# Patient Record
Sex: Female | Born: 1960
Health system: Southern US, Community
[De-identification: ages and names within clinical notes are randomized; demographics above are authoritative.]

## PROBLEM LIST (undated history)

## (undated) DIAGNOSIS — Z8719 Personal history of other diseases of the digestive system: Secondary | ICD-10-CM

## (undated) DIAGNOSIS — E785 Hyperlipidemia, unspecified: Secondary | ICD-10-CM

## (undated) DIAGNOSIS — K219 Gastro-esophageal reflux disease without esophagitis: Secondary | ICD-10-CM

## (undated) DIAGNOSIS — F329 Major depressive disorder, single episode, unspecified: Secondary | ICD-10-CM

## (undated) DIAGNOSIS — F40240 Claustrophobia: Secondary | ICD-10-CM

## (undated) DIAGNOSIS — E119 Type 2 diabetes mellitus without complications: Secondary | ICD-10-CM

## (undated) DIAGNOSIS — I10 Essential (primary) hypertension: Secondary | ICD-10-CM

## (undated) DIAGNOSIS — R51 Headache: Secondary | ICD-10-CM

## (undated) DIAGNOSIS — E039 Hypothyroidism, unspecified: Secondary | ICD-10-CM

## (undated) HISTORY — DX: Hyperlipidemia, unspecified: E78.5

## (undated) HISTORY — DX: Essential (primary) hypertension: I10

## (undated) HISTORY — PX: CHOLECYSTECTOMY: SHX55

## (undated) HISTORY — DX: Type 2 diabetes mellitus without complications: E11.9

---

## 2001-08-07 DIAGNOSIS — F32A Depression, unspecified: Secondary | ICD-10-CM

## 2001-08-07 HISTORY — DX: Depression, unspecified: F32.A

## 2002-04-15 ENCOUNTER — Encounter: Admission: RE | Admit: 2002-04-15 | Discharge: 2002-05-14 | Payer: Self-pay | Admitting: Family Medicine

## 2003-07-13 ENCOUNTER — Emergency Department (HOSPITAL_COMMUNITY): Admission: EM | Admit: 2003-07-13 | Discharge: 2003-07-13 | Payer: Self-pay

## 2003-07-28 ENCOUNTER — Ambulatory Visit (HOSPITAL_COMMUNITY): Admission: RE | Admit: 2003-07-28 | Discharge: 2003-07-28 | Payer: Self-pay | Admitting: Sports Medicine

## 2005-05-17 ENCOUNTER — Inpatient Hospital Stay (HOSPITAL_COMMUNITY): Admission: RE | Admit: 2005-05-17 | Discharge: 2005-05-23 | Payer: Self-pay | Admitting: Psychiatry

## 2005-05-17 ENCOUNTER — Ambulatory Visit: Payer: Self-pay | Admitting: Psychiatry

## 2005-10-20 ENCOUNTER — Inpatient Hospital Stay (HOSPITAL_COMMUNITY): Admission: EM | Admit: 2005-10-20 | Discharge: 2005-10-23 | Payer: Self-pay | Admitting: Emergency Medicine

## 2005-10-20 ENCOUNTER — Ambulatory Visit: Payer: Self-pay | Admitting: Infectious Diseases

## 2008-10-07 LAB — CONVERTED CEMR LAB: Microalbumin U total vol: NORMAL mg/L

## 2008-10-08 ENCOUNTER — Ambulatory Visit: Payer: Self-pay | Admitting: Family Medicine

## 2008-10-08 ENCOUNTER — Ambulatory Visit: Payer: Self-pay | Admitting: Surgery

## 2008-10-08 ENCOUNTER — Inpatient Hospital Stay (HOSPITAL_COMMUNITY): Admission: EM | Admit: 2008-10-08 | Discharge: 2008-10-14 | Payer: Self-pay | Admitting: Emergency Medicine

## 2008-10-09 LAB — CONVERTED CEMR LAB
HDL: 40 mg/dL
LDL Cholesterol: 177 mg/dL

## 2008-10-14 LAB — CONVERTED CEMR LAB
Creatinine, Ser: 1.08 mg/dL
Potassium: 3.4 meq/L

## 2008-10-16 ENCOUNTER — Telehealth: Payer: Self-pay | Admitting: Sports Medicine

## 2008-10-17 ENCOUNTER — Telehealth: Payer: Self-pay | Admitting: Family Medicine

## 2008-10-20 ENCOUNTER — Ambulatory Visit: Payer: Self-pay | Admitting: Family Medicine

## 2008-10-20 DIAGNOSIS — E119 Type 2 diabetes mellitus without complications: Secondary | ICD-10-CM | POA: Insufficient documentation

## 2008-10-26 ENCOUNTER — Encounter (HOSPITAL_BASED_OUTPATIENT_CLINIC_OR_DEPARTMENT_OTHER): Admission: RE | Admit: 2008-10-26 | Discharge: 2008-12-08 | Payer: Self-pay | Admitting: General Surgery

## 2008-10-27 ENCOUNTER — Encounter: Payer: Self-pay | Admitting: *Deleted

## 2008-10-27 ENCOUNTER — Ambulatory Visit: Payer: Self-pay | Admitting: Family Medicine

## 2008-10-27 DIAGNOSIS — I10 Essential (primary) hypertension: Secondary | ICD-10-CM | POA: Insufficient documentation

## 2008-10-27 DIAGNOSIS — E669 Obesity, unspecified: Secondary | ICD-10-CM | POA: Insufficient documentation

## 2008-10-27 DIAGNOSIS — E039 Hypothyroidism, unspecified: Secondary | ICD-10-CM | POA: Insufficient documentation

## 2008-10-28 ENCOUNTER — Encounter: Payer: Self-pay | Admitting: Sports Medicine

## 2008-11-03 ENCOUNTER — Ambulatory Visit: Payer: Self-pay | Admitting: Family Medicine

## 2008-11-03 ENCOUNTER — Ambulatory Visit (HOSPITAL_COMMUNITY): Admission: RE | Admit: 2008-11-03 | Discharge: 2008-11-03 | Payer: Self-pay | Admitting: Sports Medicine

## 2008-11-11 ENCOUNTER — Encounter: Payer: Self-pay | Admitting: Sports Medicine

## 2009-09-28 IMAGING — MG MS DIGITAL SCREENING BILAT
7 series · 7 of 7 positions shown · non-contrast
Comparison: none

DG SCHOLARSHIP MAMMOGRAM
Bilateral CC and MLO view(s) were taken.

DIGITAL SCREENING MAMMOGRAM WITH CAD:
The breast tissue is almost entirely fatty.  No masses or malignant type calcifications are 
identified.

[R CC (1 of 2)]
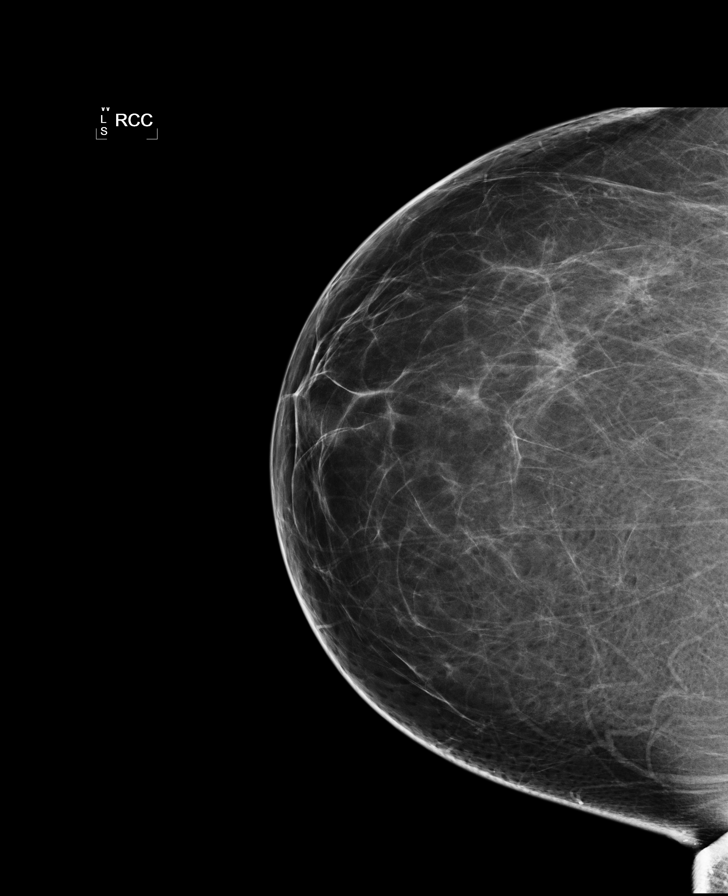

[R MLO (1 of 2)]
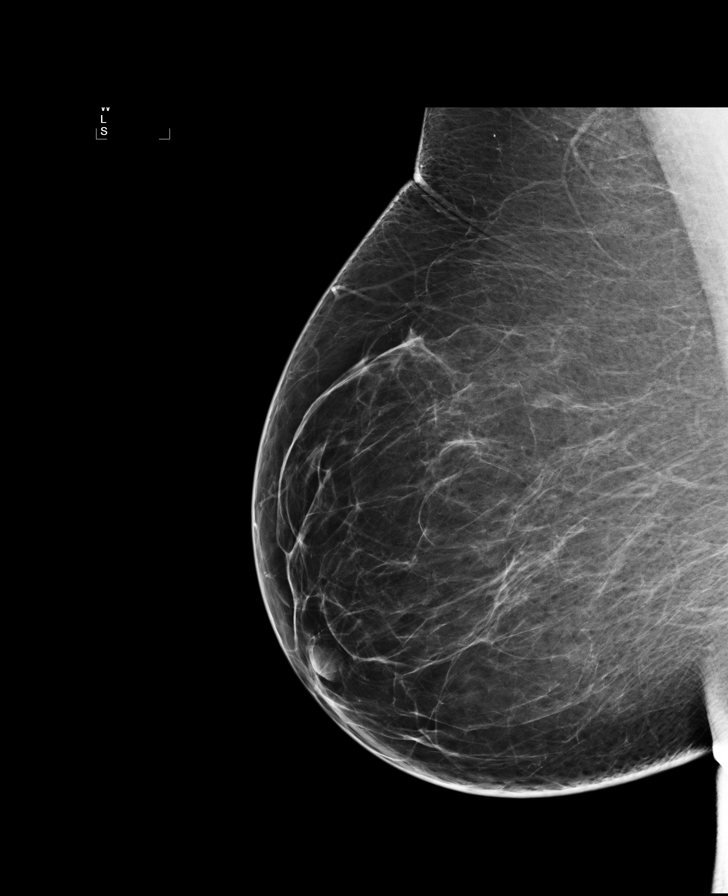

[L CC (1 of 2)]
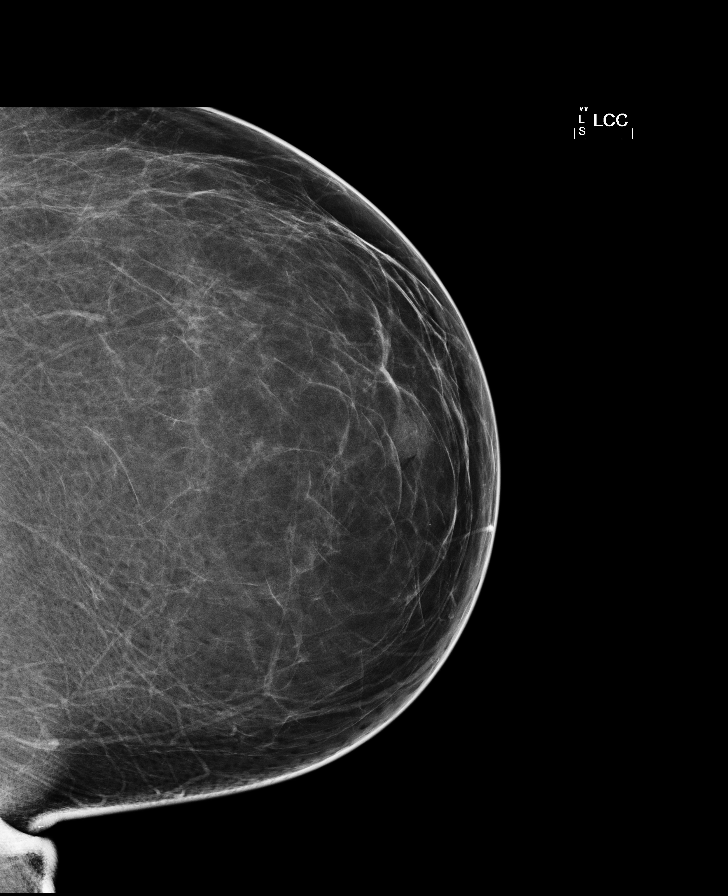

[L MLO]
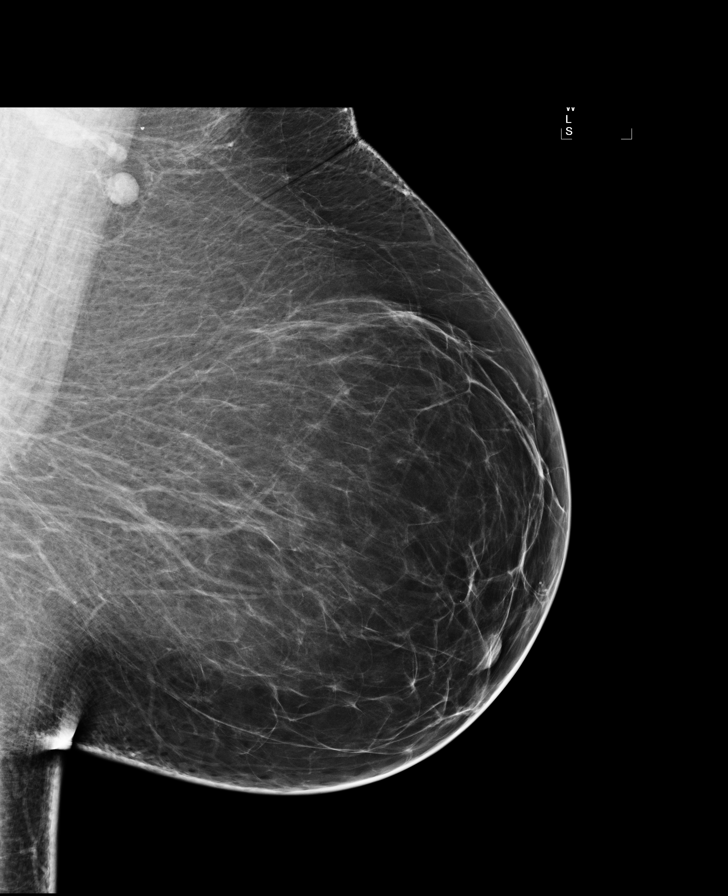

[R CC (2 of 2)]
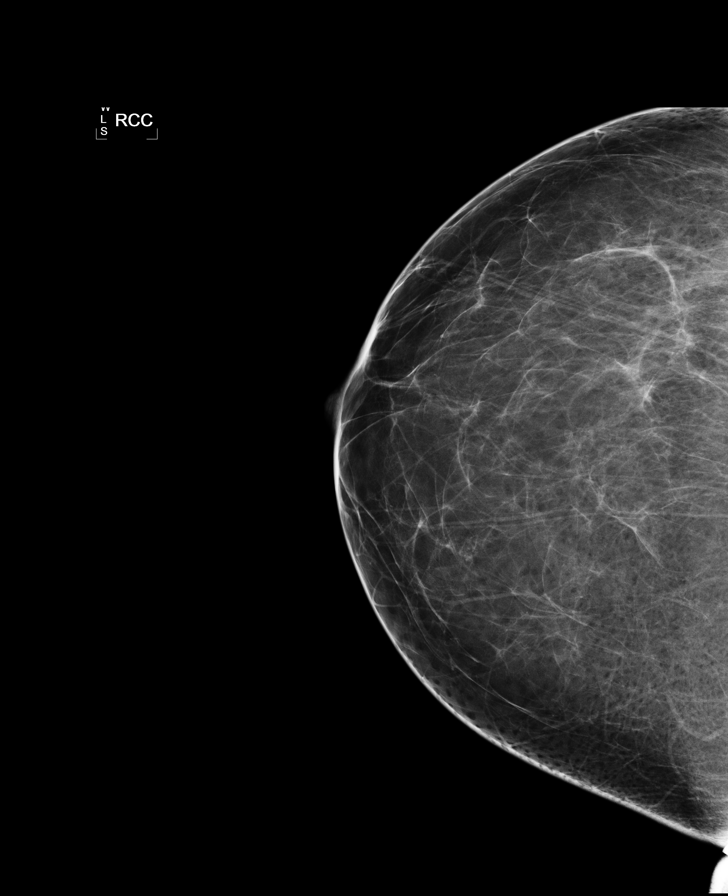

[L CC (2 of 2)]
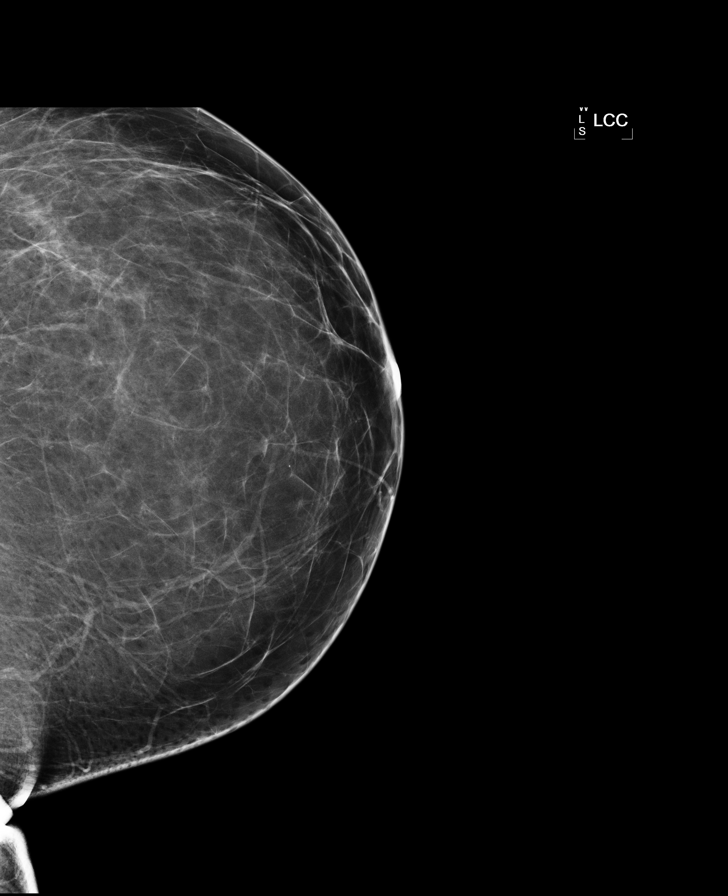

[R MLO (2 of 2)]
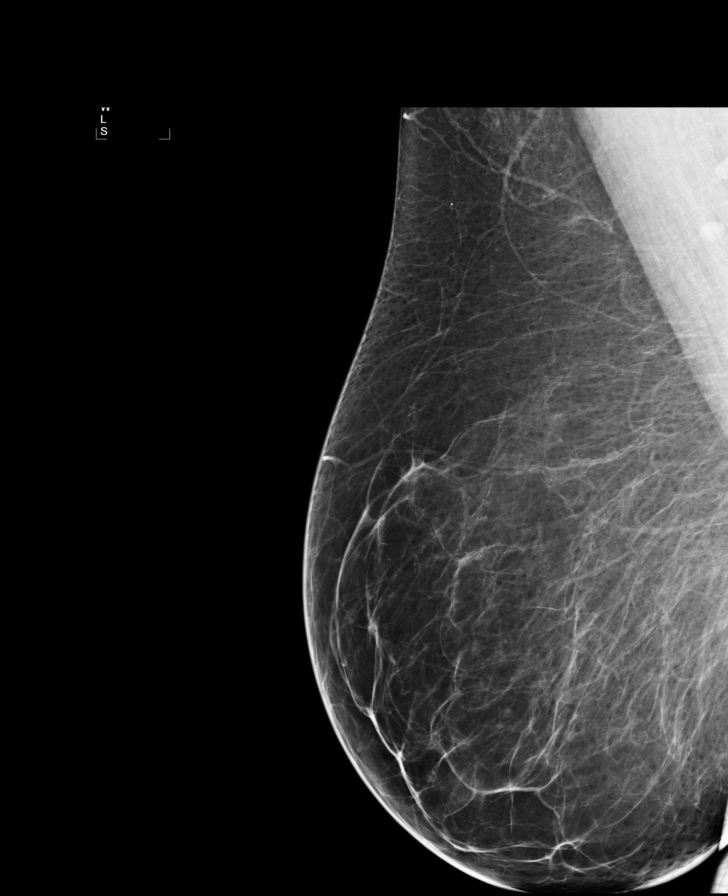

[7 of 7 positions shown; findings below may reference images not displayed]

IMPRESSION: No specific mammographic evidence of malignancy.  Next screening mammogram is recommended in one 
year.

A result letter of this screening mammogram will be mailed directly to the patient.

ASSESSMENT: Negative - BI-RADS 1

Screening mammogram in 1 year.
ANALYZED BY COMPUTER AIDED DETECTION. , THIS PROCEDURE WAS A DIGITAL MAMMOGRAM.

## 2009-10-21 ENCOUNTER — Encounter: Payer: Self-pay | Admitting: Sports Medicine

## 2009-11-04 ENCOUNTER — Ambulatory Visit (HOSPITAL_COMMUNITY): Admission: RE | Admit: 2009-11-04 | Discharge: 2009-11-04 | Payer: Self-pay | Admitting: Sports Medicine

## 2009-11-08 ENCOUNTER — Telehealth: Payer: Self-pay | Admitting: Sports Medicine

## 2010-02-10 ENCOUNTER — Ambulatory Visit: Payer: Self-pay | Admitting: Family Medicine

## 2010-02-10 ENCOUNTER — Encounter: Payer: Self-pay | Admitting: Sports Medicine

## 2010-02-10 DIAGNOSIS — Z720 Tobacco use: Secondary | ICD-10-CM | POA: Insufficient documentation

## 2010-02-11 ENCOUNTER — Telehealth (INDEPENDENT_AMBULATORY_CARE_PROVIDER_SITE_OTHER): Payer: Self-pay | Admitting: *Deleted

## 2010-02-11 LAB — CONVERTED CEMR LAB
ALT: 14 units/L (ref 0–35)
AST: 12 units/L (ref 0–37)
Albumin: 3.9 g/dL (ref 3.5–5.2)
Alkaline Phosphatase: 70 units/L (ref 39–117)
BUN: 17 mg/dL (ref 6–23)
CO2: 21 meq/L (ref 19–32)
Calcium: 9.1 mg/dL (ref 8.4–10.5)
Chloride: 108 meq/L (ref 96–112)
Cholesterol: 187 mg/dL (ref 0–200)
Creatinine, Ser: 0.73 mg/dL (ref 0.40–1.20)
Glucose, Bld: 94 mg/dL (ref 70–99)
HDL: 43 mg/dL (ref >39)
LDL Cholesterol: 123 mg/dL — ABNORMAL HIGH (ref 0–99)
Potassium: 4.6 meq/L (ref 3.5–5.3)
Sodium: 140 meq/L (ref 135–145)
TSH: 0.117 microintl units/mL — ABNORMAL LOW (ref 0.350–4.500)
Total Bilirubin: 0.3 mg/dL (ref 0.3–1.2)
Total CHOL/HDL Ratio: 4.3
Total Protein: 6.5 g/dL (ref 6.0–8.3)
Triglycerides: 106 mg/dL (ref <150)
VLDL: 21 mg/dL (ref 0–40)

## 2010-03-02 ENCOUNTER — Telehealth: Payer: Self-pay | Admitting: Sports Medicine

## 2010-03-02 ENCOUNTER — Encounter: Payer: Self-pay | Admitting: Psychology

## 2010-03-02 ENCOUNTER — Ambulatory Visit: Payer: Self-pay | Admitting: Family Medicine

## 2010-03-02 DIAGNOSIS — R1900 Intra-abdominal and pelvic swelling, mass and lump, unspecified site: Secondary | ICD-10-CM | POA: Insufficient documentation

## 2010-03-02 LAB — CONVERTED CEMR LAB
Pap Smear: NEGATIVE
Whiff Test: NEGATIVE

## 2010-03-09 ENCOUNTER — Ambulatory Visit (HOSPITAL_COMMUNITY): Admission: RE | Admit: 2010-03-09 | Discharge: 2010-03-09 | Payer: Self-pay | Admitting: Sports Medicine

## 2010-03-21 ENCOUNTER — Telehealth: Payer: Self-pay | Admitting: *Deleted

## 2010-06-12 ENCOUNTER — Inpatient Hospital Stay (HOSPITAL_COMMUNITY): Admission: EM | Admit: 2010-06-12 | Discharge: 2010-06-15 | Payer: Self-pay | Admitting: Emergency Medicine

## 2010-06-12 ENCOUNTER — Encounter: Payer: Self-pay | Admitting: Family Medicine

## 2010-06-12 DIAGNOSIS — E785 Hyperlipidemia, unspecified: Secondary | ICD-10-CM | POA: Insufficient documentation

## 2010-06-13 ENCOUNTER — Encounter: Payer: Self-pay | Admitting: Sports Medicine

## 2010-06-17 ENCOUNTER — Emergency Department (HOSPITAL_COMMUNITY)
Admission: EM | Admit: 2010-06-17 | Discharge: 2010-06-17 | Payer: Self-pay | Source: Home / Self Care | Admitting: Emergency Medicine

## 2010-06-17 ENCOUNTER — Telehealth (INDEPENDENT_AMBULATORY_CARE_PROVIDER_SITE_OTHER): Payer: Self-pay | Admitting: *Deleted

## 2010-06-17 ENCOUNTER — Emergency Department (HOSPITAL_COMMUNITY)
Admission: EM | Admit: 2010-06-17 | Discharge: 2010-06-17 | Disposition: A | Discharge status: Other Acute Inpatient Hospital | Payer: Self-pay | Source: Home / Self Care | Admitting: Family Medicine

## 2010-06-22 ENCOUNTER — Encounter: Payer: Self-pay | Admitting: Sports Medicine

## 2010-06-22 ENCOUNTER — Ambulatory Visit: Payer: Self-pay | Admitting: Family Medicine

## 2010-06-29 ENCOUNTER — Ambulatory Visit: Payer: Self-pay | Admitting: Family Medicine

## 2010-06-29 DIAGNOSIS — I872 Venous insufficiency (chronic) (peripheral): Secondary | ICD-10-CM | POA: Insufficient documentation

## 2010-07-07 ENCOUNTER — Encounter: Payer: Self-pay | Admitting: Sports Medicine

## 2010-07-07 ENCOUNTER — Ambulatory Visit: Payer: Self-pay | Admitting: Family Medicine

## 2010-07-07 LAB — CONVERTED CEMR LAB
Hgb A1c MFr Bld: 5.9 %
TSH: 0.548 microintl units/mL (ref 0.350–4.500)

## 2010-07-08 ENCOUNTER — Encounter: Payer: Self-pay | Admitting: Sports Medicine

## 2010-07-15 ENCOUNTER — Encounter: Payer: Self-pay | Admitting: Sports Medicine

## 2010-07-15 ENCOUNTER — Ambulatory Visit: Payer: Self-pay | Admitting: Family Medicine

## 2010-07-20 ENCOUNTER — Ambulatory Visit: Payer: Self-pay | Admitting: Family Medicine

## 2010-09-06 NOTE — Letter (Signed)
Summary: Results Follow-up Letter  [facility]  [address]   [place], [place] [address]   Phone: [phone]  Fax: [phone]    07/08/2010  [address] [place], [place]  [address]  [place]  Dear Ms. Drzewiecki,   The following are the results of your recent test(s):   Test     Result     Normal Range  TSH            0.548 uIU/mL         0.350-4.500    No need to change your levothyroxine dosage.  See you in a week!   Sincerely,  Rodney Langton MD [facility]           Appended Document: Results Follow-up Letter mailed

## 2010-09-06 NOTE — Progress Notes (Signed)
Summary: phn msg  Phone Note Call from Patient Call back at [facility] Phone [phone]   Caller: Patient Summary of Call: pt returned call Initial call taken by: De Nurse,  February 11, 2010 11:38 AM  Follow-up for Phone Call        pt notified of change of thyroid medication, voiced understanding Follow-up by: Gladstone Pih,  February 11, 2010 11:57 AM

## 2010-09-06 NOTE — Assessment & Plan Note (Signed)
Summary: f/u from urgent care/[facility]   Vital Signs:  Patient profile:   50 year old female Weight:      265 pounds Temp:     98.4 degrees F oral Pulse rate:   73 / minute Pulse rhythm:   regular BP sitting:   167 / 99  (left arm) Cuff size:   large  Vitals Entered By: Loralee Pacas CMA (June 22, 2010 9:44 AM) CC: follow-up visit from UC Comments thinks that she had a reaction to the cipro   Primary Care Provider:  Rodney Langton MD  CC:  follow-up visit from UC.  History of Present Illness: 50 yo female with DM2 and multiple episodes of cellulitis req hospitalization here for fu.  Her R leg has cellulitis over the entire ant aspect of her lower leg.  She was on Vanc/Zosyn/diflucan in the hospital, it is overall getting worse per pt.  She was placed on Clinda and Suprax at the [facility] as she had a reaction to cipro.  She has 2d left of Suprax and several days left of her clinda.  No fevers/chills, just pain.  Her L leg is healed but is discolored.  I spent over 25 mins with this patient.  Habits & Providers  Alcohol-Tobacco-Diet     Tobacco Status: quit < 6 months     Tobacco Counseling: to quit use of tobacco products     Cigarette Packs/Day: 0.5  Current Medications (verified): 1)  Simvastatin 80 Mg Tabs (Simvastatin) .... Sig: Take 1 Tab By Mouth At [facility] 2)  Metformin Hcl 500 Mg Tabs (Metformin Hcl) .... Sig: Take 1 Tablet By Mouth Twice Daily 3)  Lisinopril 40 Mg Tabs (Lisinopril) .... One Tab By Mouth Daily 4)  Levothroid 150 Mcg Tabs (Levothyroxine Sodium) .... One Tab By Mouth Daily 5)  Tramadol Hcl 50 Mg Tabs (Tramadol Hcl) .... Sig: Take 1 Tab By Mouth Every 6 Hours For Pain in Leg 6)  Aspirin 81 Mg Tbec (Aspirin) .... One Tab By Mouth Daily  Allergies (verified): 1)  ! Cipro  Past History:  Past Medical History: DM2 HTN Recurrent LE cellulitis Hypothyroid HLD  Social History: Smoking Status:  quit < 6 months  Physical Exam  General:   Well-developed,well-nourished,in no acute distress; alert,appropriate and cooperative throughout examination Skin:  R LE with well demarcated swelling, purulence, discoloration over anterior lower leg.  TTP.  Minimal warmth.  Few areas of greenish purulence.  Some weeping.  Skin is sloughing off.  Feet unremarkable.  Hemosiderin deposits seen on medial aspect of mid lower leg on left side. Additional Exam:  Skin biopsy. Consent obtained. 5cc 1% lidocaine with epi infiltrated at the border of the skin lesion.   Bleeding controlled with pressure. Anesthesia ensured, then in a sterile fashion with drapes scissors used to nick the surface of the skin and skin/wound culture obtained. Next, 5mm punch biopsy obtained and sent to pathology. two 3-0 prolene stitches placed over biopsy wound for closure and for hemostasis. Antibiotic ointment applied. Unna boot placed over this.   Impression & Recommendations:  Problem # 1:  CELLULITIS, LEG, RIGHT (ICD-682.6) Assessment New Recurrent. Current clinda/suprax antibiotic regimen will cover MRSA, anaerobes, and pseudomonas. Wound Cx taken. Biopsy obtained. As this is likely exacerbated by venous stasis, unna boot applied. Pt to RTC 1 week to remove stitches and replace unna boot.  Orders: Culture, Wound -[facility] (16109) Punch Biopsy- [facility] (11100) [facility]- Est  Level 4 (60454)  Complete Medication List: 1)  Simvastatin 80  Mg Tabs (Simvastatin) .... Sig: take 1 tab by mouth at hs 2)  Metformin Hcl 500 Mg Tabs (Metformin hcl) .... Sig: take 1 tablet by mouth twice daily 3)  Lisinopril 40 Mg Tabs (Lisinopril) .... One tab by mouth daily 4)  Levothroid 150 Mcg Tabs (Levothyroxine sodium) .... One tab by mouth daily 5)  Tramadol Hcl 50 Mg Tabs (Tramadol hcl) .... Sig: take 1 tab by mouth every 6 hours for pain in leg 6)  Aspirin 81 Mg Tbec (Aspirin) .... One tab by mouth daily  Patient Instructions: 1)  refilled tramadol 2)  Biopsy and wound culture of  leg 3)  Finish Clindamycin and Cefixime. 4)  Unna boot placed. 5)  Come back in 1 week to have stitches removed and Unna boot replaced. 6)  -Dr. Karie Schwalbe. Prescriptions: TRAMADOL HCL 50 MG TABS (TRAMADOL HCL) SIG: Take 1 tab by mouth every 6 hours for pain in leg  #60 x 3   Entered and Authorized by:   Rodney Langton MD   Signed by:   Rodney Langton MD on 06/22/2010   Method used:   Print then Give to Patient   RxID:   [id number]    Orders Added: 1)  Culture, Wound -[facility] [87070] 2)  Punch Biopsy- [facility] [11100] 3)  [facility]- Est  Level 4 [76195]

## 2010-09-06 NOTE — Assessment & Plan Note (Signed)
 Summary: HFU,df   Vital Signs:  Patient profile:   50 year old female Height:      61.5 inches Weight:      255.7 pounds BMI:     47.70 Temp:     98.8 degrees F oral Pulse rate:   97 / minute Pulse rhythm:   regular BP sitting:   139 / 95  (left arm)  Vitals Entered By: [name] (October 27, 2008 2:46 PM) HDL Result Date:  10/09/2008 HDL Result:  40 LDL Result Date:  10/09/2008 LDL Result:  177 Diabetic Foot Exam Date:  10/27/2008 Diabetes Foot Check Result:  normal HGBA1C Result Date:  10/09/2008 HGBA1C Next Due:  3 mo Microalbumin Result Date:  10/07/2008 Microalbumin Result:  normal Creatinine Result Date: 10/14/2008 Creatinine Result:  1.08 Potassium Result Date:  10/14/2008 Potassium Result:  3.4  Is Patient Diabetic? Yes  Pain Assessment Patient in pain? yes     Location: Left leg Intensity: 4 Type: aching Onset of pain  ?   History of Present Illness: 88 Female hospital follow up of LLE cellulitis.  Pt has been afebrile, edema is greatly resolved (I took care of her in the hospital).  She is concerned about dark/greenish discoloration.  There was some serous leakage at last visit, much better. No fevers/chills, but she does have some pain on palpation.  She is ambulatory with a limp.  Goes to a wound clinic. She has been wrapping it with Telfa to skin, kerlix, and then ace.  Habits & Providers     Tobacco Status: quit < 6 months     Ophthalmologist: [facility] [employer]  Review of Systems       12 pointt negative except as in HPI  Physical Exam  General:  Well-developed,well-nourished,in no acute distress; alert,appropriate and cooperative throughout examination Lungs:  Normal respiratory effort, chest expands symmetrically. Lungs are clear to auscultation, no crackles or wheezes. Heart:  Normal rate and regular rhythm. S1 and S2 normal without gallop, murmur, click, rub or other extra sounds. Extremities:  LLE edema much  improved since hospital discharge, skin cracking and peeling with greenish coloration.  NVI distally.  TTP, no purulence, no foul odor.  Dead skin debrided with saline and 4x4s.  smooth, soft base present without any bleeding.   Impression & Recommendations:  Problem # 1:  CELLULITIS AND ABSCESS OF LEG EXCEPT FOOT (ICD-682.6) This wound looks significantly better than in the hospital.  It was significantly covered with ecchymoses at discharge and the greenish coloration is likely biliverdin stage of erythrocyte breakdown.  It was only present in the dead surface eschar.  Wound debrided of dead and easily removeable debris, then re-dressed with telfa, kerlix, and ace wrapped distally to proximally.   recommended over the counter benadryl  spray for pruriitus.  I spent over 35 minutes with this patient.  Orders: [facility]- Est  Level 4 (00785) Debridement Wound (88999)  Problem # 2:  DIABETES MELLITUS, TYPE II (ICD-250.00) A1c was 6.1 on 10/09/08.  No changes in DM meds.  Need eye exam, pt states she has one schedule. Will recheck A1c in 3 months. Pt also requesting nutrition referral.  Her updated medication list for this problem includes:    Metformin Hcl 500 Mg Tabs (Metformin hcl) ..... Sig: take 1 tablet by mouth twice daily    Lisinopril  10 Mg Tabs (Lisinopril ) ..... Sig: take 2 tabs by mouth daily  Orders: [facility]- Est  Level 4 (00785) Nutrition Referral (Nutrition)  Ophthalmology Referral (Ophthalmology)  Problem # 3:  HYPERTENSION, BENIGN ESSENTIAL (ICD-401.1) Elevated today, will increase to 20mg  once daily lisinopril  (2 of her 10mg  tabs)  Her updated medication list for this problem includes:    Lisinopril  10 Mg Tabs (Lisinopril ) ..... Sig: take 2 tabs by mouth daily  Orders: [facility]- Est  Level 4 (00785)  Problem # 4:  OBESITY (ICD-278.00) Nutrition consult.  Orders: Nutrition Referral (Nutrition)  Problem # 5:  Screening Breast Cancer (ICD-V76.10) Pt due for mammogram. Will  schedule.  Problem # 6:  Screening Cervical Cancer (ICD-V76.2) Will do PAP at next appt in one month.  Problem # 7:  HYPOTHYROIDISM (ICD-244.9) Will reheck TSH 6 weeks after last one, -> mid april.  Her updated medication list for this problem includes:    Levothroid 175 Mcg Tabs (Levothyroxine  sodium) ..... Sig: take 1 tab by mouth once daily  Complete Medication List: 1)  Simvastatin 80 Mg Tabs (Simvastatin) .... Sig: take 1 tab by mouth at hs 2)  Metformin Hcl 500 Mg Tabs (Metformin hcl) .... Sig: take 1 tablet by mouth twice daily 3)  Lisinopril  10 Mg Tabs (Lisinopril ) .... Sig: take 2 tabs by mouth daily 4)  Levothroid 175 Mcg Tabs (Levothyroxine  sodium) .... Sig: take 1 tab by mouth once daily 5)  Tramadol Hcl 50 Mg Tabs (Tramadol hcl) .... Sig: take 1 tab by mouth every 6 hours for pain in leg  Other Orders: Mammogram (Screening) (Mammo)  Patient Instructions: 1)  Great to see you today, 2)  We debrided your wound today. 3)  I need you to come back to see me in the middle of April ( ~one month from now) to have your TSH tested.  I will also do your PAP smear. 4)  Be sure to go to your Optometrist to have a diabetic eye exam.  Have them send the results to me.   5)  I will give you information to get your mammogram tomorrow. 6)  Your blood pressure is elevated so I will increase your lisinopril . 7)  Be sure to go to your wound care appointment.  Go to any pharmacy and get some Diphenhydramine  spray.  It can help the itching.  Be sure to keep your leg clean and dry if you wash it! 8)  I will also set you up with nutrition referral as requested. 9)  -Dr. ONEIDA.

## 2010-09-06 NOTE — Progress Notes (Signed)
Summary: Test Res  Phone Note Call from Patient Call back at Home Phone [phone]   Caller: Patient Summary of Call: Pt checking on results of ultrasound form the 3rd.   Initial call taken by: Clydell Hakim,  March 21, 2010 1:44 PM  Follow-up for Phone Call        She has a small fibroid, otherwise normal.  No intervention required. Follow-up by: Rodney Langton MD,  March 21, 2010 4:44 PM  Additional Follow-up for Phone Call Additional follow up Details #1::        lvm for pt to return call Additional Follow-up by: Loralee Pacas CMA,  March 21, 2010 4:53 PM    Additional Follow-up for Phone Call Additional follow up Details #2::    pt notified of u/s results Follow-up by: Loralee Pacas CMA,  March 22, 2010 10:07 AM

## 2010-09-06 NOTE — Assessment & Plan Note (Signed)
Summary: pap/eo   Vital Signs:  Patient profile:   50 year old female Height:      61.5 inches Weight:      257 pounds BMI:     47.95 Temp:     98.2 degrees F oral Pulse rate:   76 / minute BP sitting:   135 / 87  (left arm) Cuff size:   large  Vitals Entered By: Garen Grams LPN (March 02, 2010 8:43 AM) CC: pap Is Patient Diabetic? Yes Did you bring your meter with you today? No Pain Assessment Patient in pain? no        Primary Care Provider:  Rodney Langton MD  CC:  pap.  History of Present Illness: Pt here for routine PAP.  No complaints.  HTN:  BP high today on manual recheck as well.  Habits & Providers  Alcohol-Tobacco-Diet     Tobacco Status: current     Tobacco Counseling: to quit use of tobacco products     Cigarette Packs/Day: 0.5  Current Medications (verified): 1)  Simvastatin 80 Mg Tabs (Simvastatin) .... Sig: Take 1 Tab By Mouth At [facility] 2)  Metformin Hcl 500 Mg Tabs (Metformin Hcl) .... Sig: Take 1 Tablet By Mouth Twice Daily 3)  Lisinopril 40 Mg Tabs (Lisinopril) .... One Tab By Mouth Daily 4)  Levothroid 150 Mcg Tabs (Levothyroxine Sodium) .... One Tab By Mouth Daily 5)  Tramadol Hcl 50 Mg Tabs (Tramadol Hcl) .... Sig: Take 1 Tab By Mouth Every 6 Hours For Pain in Leg 6)  Aspirin 81 Mg Tbec (Aspirin) .... One Tab By Mouth Daily  Allergies (verified): No Known Drug Allergies  Social History: Packs/Day:  0.5  Review of Systems       See HPI  Physical Exam  General:  Well-developed,well-nourished,in no acute distress; alert,appropriate and cooperative throughout examination Lungs:  Normal respiratory effort, chest expands symmetrically. Lungs are clear to auscultation, no crackles or wheezes. Heart:  Normal rate and regular rhythm. S1 and S2 normal without gallop, murmur, click, rub or other extra sounds. Abdomen:  Bowel sounds positive,abdomen soft and non-tender without masses, organomegaly or hernias noted. Genitalia:  Normal  introitus for age, no external lesions, whitish vaginal discharge, mucosa pink and moist, no vaginal or cervical lesions, no vaginal atrophy, no friaility or hemorrhage, 18 wk uterus size leaning right, lumpy feel, no adnexal masses or tenderness   Impression & Recommendations:  Problem # 1:  PELVIC MASS (ICD-789.30) Assessment New Pelvic/transvag US.  Orders: [facility]- Est  Level 4 (16109) Ultrasound (Ultrasound)  Problem # 2:  VAGINAL DISCHARGE (ICD-623.5) Assessment: New Wet prep:Yeast, diflucan by mouth x1  Orders: Wet Prep- [facility] (87210) [facility]- Est  Level 4 (99214)  Problem # 3:  SCREENING FOR MALIGNANT NEOPLASM OF THE CERVIX (ICD-V76.2) Assessment: Unchanged PAP done. Orders: Pap Smear-[facility] (60454-09811) [facility]- Est  Level 4 (91478)  Problem # 4:  SMOKER (ICD-305.1) Assessment: Unchanged Counselled with PhD student.  Problem # 5:  HYPERTENSION, BENIGN ESSENTIAL (ICD-401.1) Assessment: Unchanged Increasing lisinopril to 40 once daily.  Her updated medication list for this problem includes:    Lisinopril 40 Mg Tabs (Lisinopril) ..... One tab by mouth daily  Complete Medication List: 1)  Simvastatin 80 Mg Tabs (Simvastatin) .... Sig: take 1 tab by mouth at hs 2)  Metformin Hcl 500 Mg Tabs (Metformin hcl) .... Sig: take 1 tablet by mouth twice daily 3)  Lisinopril 40 Mg Tabs (Lisinopril) .... One tab by mouth daily 4)  Levothroid 150 Mcg  Tabs (Levothyroxine sodium) .... One tab by mouth daily 5)  Tramadol Hcl 50 Mg Tabs (Tramadol hcl) .... Sig: take 1 tab by mouth every 6 hours for pain in leg 6)  Aspirin 81 Mg Tbec (Aspirin) .... One tab by mouth daily 7)  Diflucan 150 Mg Tabs (Fluconazole) .... One tab by mouth x1  Other Orders: Urinalysis-[facility] (00000)  Patient Instructions: 1)  Great to see you, 2)  PAP 3)  Wet prep 4)  Increase lisinopril to TWO 20mg  pills a day, when you run out, refill with the new 40mg  pill, already sent to [facility]. 5)  Checking Urine. 6)  Come  back to see me in 6 months or sooner if you have any problems. 7)  -Dr. Karie Schwalbe. Prescriptions: DIFLUCAN 150 MG TABS (FLUCONAZOLE) One tab by mouth x1  #1 x 0   Entered and Authorized by:   Rodney Langton MD   Signed by:   Rodney Langton MD on 03/02/2010   Method used:   Electronically to        [facility] [phone]* (retail)       [address]       [place], [place]  [address]       Ph: [phone]       Fax: [phone]   RxID:   [id number] LISINOPRIL 40 MG TABS (LISINOPRIL) One tab by mouth daily  #90 x 1   Entered and Authorized by:   Rodney Langton MD   Signed by:   Rodney Langton MD on 03/02/2010   Method used:   Electronically to        [facility] [phone]* (retail)       [address]       [place], [place]  [address]       Ph: [phone]       Fax: [phone]   RxID:   [id number]   Last Diabetes Foot Check:  yes (02/10/2010 8:35:24 AM) Diabetes Foot Check Next Due:  1 yr Microalbumin Next Due:  1 yr Self-Mgt EDU Date:  03/02/2010 Self-Mgt EDU:  completed Self-Mgt EDU Next Due:  1 yr Last Creatinine:  0.73 (02/10/2010 8:03:00 PM) Creatinine Next Due: 1 yr Last Potassium:  4.6 (02/10/2010 8:03:00 PM) Potassium Next Due:  1 yr   Laboratory Results  Date/Time Received: March 02, 2010 9:01 AM  Date/Time Reported: March 02, 2010 9:14 AM   Wet [place] Source: vag WBC/hpf: 0-3 Bacteria/hpf: 2+  Rods Clue cells/hpf: none  Negative whiff Yeast/hpf: occ Trichomonas/hpf: none Comments: ...............test performed by......Marland KitchenBonnie A. [place], MLS (ASCP)cm

## 2010-09-06 NOTE — Progress Notes (Signed)
Summary: Rx Req  Phone Note Refill Request Call back at Home Phone [phone] Message from:  Patient  Refills Requested: Medication #1:  LISINOPRIL 10 MG TABS SIG: take 2 tabs by mouth daily [facility] [address]  Initial call taken by: Clydell Hakim,  November 08, 2009 5:22 PM  Follow-up for Phone Call        LM that rx was at pharmacy. Follow-up by: Clydell Hakim,  November 09, 2009 10:06 AM    New/Updated Medications: LISINOPRIL 20 MG TABS (LISINOPRIL) One tab by mouth daily Prescriptions: LISINOPRIL 20 MG TABS (LISINOPRIL) One tab by mouth daily  #90 x 1   Entered and Authorized by:   Rodney Langton MD   Signed by:   Rodney Langton MD on 11/08/2009   Method used:   Electronically to        [facility] [phone]* (retail)       [address]       [place], [place]  [address]       Ph: [phone]       Fax: [phone]   RxID:   [id number]

## 2010-09-06 NOTE — Progress Notes (Signed)
Summary: Test results.  Phone Note Outgoing Call Call back at [facility] Phone [phone]   Call placed by: Rodney Langton MD,  March 02, 2010 10:47 AM Summary of Call: Let pt know results of wet prep, she will go pick up diflucan. Initial call taken by: Rodney Langton MD,  March 02, 2010 10:48 AM

## 2010-09-06 NOTE — Assessment & Plan Note (Signed)
Summary: f/up,tcb   Vital Signs:  Patient profile:   50 year old female Weight:      260.4 pounds Temp:     98.7 degrees F oral Pulse rate:   87 / minute Pulse rhythm:   regular BP sitting:   135 / 85  (left arm) Cuff size:   large  Vitals Entered By: Loralee Pacas CMA (February 10, 2010 8:42 AM)  Serial Vital Signs/Assessments:  Time      Position  BP       Pulse  Resp  Temp     By                     130/86                         Rodney Langton MD  CC: follow-up visit Is Patient Diabetic? Yes   Primary Care Provider:  Rodney Langton MD  CC:  follow-up visit.  History of Present Illness: Here for fu:  DM2:  No problems with metformin.  Lost  ~ 10 lbs a year ago then gained it back.  Already saw nutritionist.  DUe for A1c.  HTN:  No problems with lisinopril.  Cellulitis:  Well healed, saw wound care center.  Some discoloration but otherwise not bothering her.  Hypothyroid:  Still taking synthroid.  Due for TSH.    Habits & Providers  Alcohol-Tobacco-Diet     Tobacco Status: current     Tobacco Counseling: to quit use of tobacco products     Cigarette Packs/Day: <0.25  Current Medications (verified): 1)  Simvastatin 80 Mg Tabs (Simvastatin) .... Sig: Take 1 Tab By Mouth At [facility] 2)  Metformin Hcl 500 Mg Tabs (Metformin Hcl) .... Sig: Take 1 Tablet By Mouth Twice Daily 3)  Lisinopril 20 Mg Tabs (Lisinopril) .... One Tab By Mouth Daily 4)  Levothroid 175 Mcg Tabs (Levothyroxine Sodium) .... Sig: Take 1 Tab By Mouth Once Daily 5)  Tramadol Hcl 50 Mg Tabs (Tramadol Hcl) .... Sig: Take 1 Tab By Mouth Every 6 Hours For Pain in Leg 6)  Aspirin 81 Mg Tbec (Aspirin) .... One Tab By Mouth Daily  Allergies (verified): No Known Drug Allergies  Past History:  Past Medical History: DM2 HTN Hx LLE cellulitis Hypothyroid  Family History: Father with Colon Ca at 39.  Social History: Smokes 10 cigs a day [employer].Smoking Status:  current Packs/Day:   <0.25  Review of Systems       SEe HPI  Physical Exam  General:  Well-developed,well-nourished,in no acute distress; alert,appropriate and cooperative throughout examination Head:  Normocephalic and atraumatic without obvious abnormalities. Eyes:  No corneal or conjunctival inflammation noted. EOMI. Perrla. Ears:  External ear exam shows no significant lesions or deformities.  Nose:  External nasal examination shows no deformity or inflammation. Mouth:  Oral mucosa and oropharynx without lesions or exudates.  Teeth in good repair. Neck:  No deformities, masses, or tenderness noted. Lungs:  Normal respiratory effort, chest expands symmetrically. Lungs are clear to auscultation, no crackles or wheezes. Heart:  Normal rate and regular rhythm. S1 and S2 normal without gallop, murmur, click, rub or other extra sounds. Abdomen:  Bowel sounds positive,abdomen soft and non-tender without masses, organomegaly or hernias noted. Extremities:  Some minimal discoloration on LLE suggestive of venous stasis.  No edema, non tender. Neurologic:  Grossly Non- focal.  Diabetes Management Exam:    Foot Exam (with socks and/or  shoes not present):       Sensory-Monofilament:          Left foot: normal          Right foot: normal   Impression & Recommendations:  Problem # 1:  HYPOTHYROIDISM (ICD-244.9) Assessment Unchanged Checking TSH.  Her updated medication list for this problem includes:    Levothroid 175 Mcg Tabs (Levothyroxine sodium) ..... Sig: take 1 tab by mouth once daily  Orders: TSH-[facility] ([id number]) [facility]- Est  Level 4 (09811)  Problem # 2:  HYPERTENSION, BENIGN ESSENTIAL (ICD-401.1) Assessment: Unchanged No changes.  Recommended lifestyle changes.  CLose to goal.  Her updated medication list for this problem includes:    Lisinopril 20 Mg Tabs (Lisinopril) ..... One tab by mouth daily  Orders: Comp Met-[facility] ([id number]) Urinalysis-[facility] (00000) [facility]- Est  Level 4  (13086)  Problem # 3:  DIABETES MELLITUS, TYPE II (ICD-250.00) Assessment: Unchanged Checking the below labs.  WIll augment metformin as needed.  Started baby ASA. Her updated medication list for this problem includes:    Metformin Hcl 500 Mg Tabs (Metformin hcl) ..... Sig: take 1 tablet by mouth twice daily    Lisinopril 20 Mg Tabs (Lisinopril) ..... One tab by mouth daily    Aspirin 81 Mg Tbec (Aspirin) ..... One tab by mouth daily  Orders: Comp Met-[facility] [id number]) Lipid-[facility] (28413-24401) Urinalysis-[facility] (00000) A1C-[facility] (02725) [facility]- Est  Level 4 (36644)  Problem # 4:  CELLULITIS AND ABSCESS OF LEG EXCEPT FOOT (ICD-682.6) Assessment: Improved Healed.  Problem # 5:  Preventive Health Care (ICD-V70.0) Mammo normal recently, due for PAP but on period, will make appt for later when bleeding stopped.    Problem # 6:  SMOKER (ICD-305.1) Wants to quit, referral to smoking cessation clinic.  Complete Medication List: 1)  Simvastatin 80 Mg Tabs (Simvastatin) .... Sig: take 1 tab by mouth at hs 2)  Metformin Hcl 500 Mg Tabs (Metformin hcl) .... Sig: take 1 tablet by mouth twice daily 3)  Lisinopril 20 Mg Tabs (Lisinopril) .... One tab by mouth daily 4)  Levothroid 175 Mcg Tabs (Levothyroxine sodium) .... Sig: take 1 tab by mouth once daily 5)  Tramadol Hcl 50 Mg Tabs (Tramadol hcl) .... Sig: take 1 tab by mouth every 6 hours for pain in leg 6)  Aspirin 81 Mg Tbec (Aspirin) .... One tab by mouth daily   Patient Instructions: 1)  Great to see you again, finally..! 2)  Will check some bloodwork and urine. 3)  Come back to see me when your period has finished so we can do the PAP smear. 4)  Make an appt at the front to see Dr. Raymondo Band in smoking cessation clinic. 5)  -Dr. Karie Schwalbe. Prescriptions: ASPIRIN 81 MG TBEC (ASPIRIN) One tab by mouth daily  #90 x 11   Entered and Authorized by:   Rodney Langton MD   Signed by:   Rodney Langton MD on 02/10/2010   Method used:    Electronically to        [facility] [phone]* (retail)       [address]       [place], [place]  [address]       Ph: [phone]       Fax: [phone]   RxID:   [id number]      Diabetic Foot Exam Foot Inspection Is there a history of a foot ulcer?              No Is there a  foot ulcer now?              No Is there swelling or an abnormal foot shape?          No Are the toenails long?                No Are the toenails thick?                No Are the toenails ingrown?              No Is there heavy callous build-up?              No Is there pain in the calf muscle (Intermittent claudication) when walking?    NoIs there a claw toe deformity?              No Is there elevated skin temperature?            No Is there limited ankle dorsiflexion?            No Is there foot or ankle muscle weakness?            No  Diabetic Foot Care Education Patient educated on appropriate care of diabetic feet.  Pulse Check          Right Foot          Left Foot Posterior Tibial:        normal            normal Dorsalis Pedis:        normal            normal  High Risk Feet? No   10-g (5.07) Semmes-Weinstein Monofilament Test Performed by: Rodney Langton MD          Right Foot          Left Foot Visual Inspection               Test Control      normal         normal Site 1         normal         normal Site 2         normal         normal Site 3         normal         normal Site 4         normal         normal Site 5         normal         normal Site 6         normal         normal Site 7         normal         normal Site 8         normal         normal Site 9         normal         normal Site 10         normal         normal  Impression      normal         normal         CMP ordered       Nursing Instructions: Diabetic foot exam today   Appended Document: A1c results  Laboratory  Results   Blood Tests   Date/Time Received: February 10, 2010  9:26 AM  Date/Time Reported: February 10, 2010 9:51 AM   HGBA1C: 5.9%   (Normal Range: Non-Diabetic - 3-6%   Control Diabetic - 6-8%)  Comments: ...........test performed by...........Marland KitchenTerese Door, CMA

## 2010-09-06 NOTE — Letter (Signed)
Summary: Generic Letter  [facility]  [address]   [place], [place] [address]   Phone: [phone]  Fax: [phone]    10/21/2009  Gail Cruz [address] [place], [place]  [address]  [place]  Dear [name],    You are overdue for some preventive medical tests.  Please make an appt to see me as soon as is convenient for you.     Sincerely,   Rodney Langton MD

## 2010-09-06 NOTE — Assessment & Plan Note (Signed)
Summary: f/u eo   Vital Signs:  Patient profile:   50 year old female Height:      61.5 inches Weight:      250.4 pounds BMI:     46.71 Temp:     98.4 degrees F oral Pulse rate:   85 / minute BP sitting:   135 / 81  (left arm) Cuff size:   large  Vitals Entered By: Jimmy Footman, CMA (June 29, 2010 11:41 AM)   Primary Care Provider:  Rodney Langton MD   History of Present Illness: 50 yo female with DM2 and multiple episodes of cellulitis req hospitalization here for fu.  Her R leg has cellulitis over the entire ant aspect of her lower leg.  She was on Vanc/Zosyn/diflucan in the hospital, it is overall getting worse per pt.  Also s/p a course of Suprax at the [facility] as she had a reaction to cipro.   Unna boot placed at last visit and biopsy obtained to assess for vasculitis/calciphylaxis or other obscure process.  Results of bx indicated resolving cellulitis and venous stasis.  Pt did well with Unna boot, only got it wet once.  Pain improved.  Swelling improved.    Habits & Providers  Alcohol-Tobacco-Diet     Tobacco Status: quit < 6 months     Tobacco Counseling: to quit use of tobacco products     Cigarette Packs/Day: 0.5  Current Medications (verified): 1)  Simvastatin 80 Mg Tabs (Simvastatin) .... Sig: Take 1 Tab By Mouth At [facility] 2)  Metformin Hcl 500 Mg Tabs (Metformin Hcl) .... Sig: Take 1 Tablet By Mouth Twice Daily 3)  Lisinopril 40 Mg Tabs (Lisinopril) .... One Tab By Mouth Daily 4)  Levothroid 150 Mcg Tabs (Levothyroxine Sodium) .... One Tab By Mouth Daily 5)  Tramadol Hcl 50 Mg Tabs (Tramadol Hcl) .... Sig: Take 1 Tab By Mouth Every 6 Hours For Pain in Leg 6)  Aspirin 81 Mg Tbec (Aspirin) .... One Tab By Mouth Daily  Allergies (verified): 1)  ! Cipro  Review of Systems       See hPI  Physical Exam  General:  Well-developed,well-nourished,in no acute distress; alert,appropriate and cooperative throughout examination Extremities:  RLE with erythema extending  from top of ankle to just below knee joint.  Erythema does not cross the joint.  No crepitus felt. There is a small area on the anterior  RLE that is scabbed over without drainage.  Wound margins marked proximally.  LLE with mild edema, no warmth or erythema present.  Overall improved since last visit.  Swelling overall improved. Additional Exam:  2 stitches removed from biopsy site, hemostatic, clean.  Leg scrubbed and debrided, unna boot reapplied.   Impression & Recommendations:  Problem # 1:  CELLULITIS, LEG, RIGHT (ICD-682.6) Assessment Improved  Recurrent. Finished clinda/suprax, covers MRSA, anaerobes, and pseudomonas. Wound Cx Neg Biopsy suggestive of stasis and resolving cellulitis, no signs vasculitis, calciphylaxis. Unna boot reapplied today, pt to RTC weekly for unna boot changes.  Orders: [facility]- Est Level  3 (60454)  Problem # 2:  UNSPECIFIED VENOUS INSUFFICIENCY (ICD-459.81) Assessment: New Weekly Unna boots. Pt to wear compression hose on other leg when up during the day. This is a possible culprit for her recurrent cellulitis.  Orders: [facility]- Est Level  3 (09811)  Complete Medication List: 1)  Simvastatin 80 Mg Tabs (Simvastatin) .... Sig: take 1 tab by mouth at hs 2)  Metformin Hcl 500 Mg Tabs (Metformin hcl) .... Sig: take 1  tablet by mouth twice daily 3)  Lisinopril 40 Mg Tabs (Lisinopril) .... One tab by mouth daily 4)  Levothroid 150 Mcg Tabs (Levothyroxine sodium) .... One tab by mouth daily 5)  Tramadol Hcl 50 Mg Tabs (Tramadol hcl) .... Sig: take 1 tab by mouth every 6 hours for pain in leg 6)  Aspirin 81 Mg Tbec (Aspirin) .... One tab by mouth daily   Orders Added: 1)  [facility]- Est Level  3 [30865]

## 2010-09-06 NOTE — Assessment & Plan Note (Signed)
Summary: f/u per dr t/eo   Primary Care Provider:  Rodney Langton MD   History of Present Illness: 50 yo female with DM2 and multiple episodes of cellulitis req hospitalization here for fu.  Her R leg has cellulitis over the entire ant aspect of her lower leg.  She was on Vanc/Zosyn/diflucan in the hospital, it is overall getting worse per pt.  Also s/p a course of Suprax at the [facility] as she had a reaction to cipro.   Unna boot #2 placed at last visit and biopsy obtained to assess for vasculitis/calciphylaxis or other obscure process.  Results of bx indicated resolving cellulitis and venous stasis.  Pt doing well with Unna boot.  Pain improved.  Swelling improved.    Current Medications (verified): 1)  Simvastatin 80 Mg Tabs (Simvastatin) .... Sig: Take 1 Tab By Mouth At [facility] 2)  Metformin Hcl 500 Mg Tabs (Metformin Hcl) .... Sig: Take 1 Tablet By Mouth Twice Daily 3)  Lisinopril 40 Mg Tabs (Lisinopril) .... One Tab By Mouth Daily 4)  Levothroid 150 Mcg Tabs (Levothyroxine Sodium) .... One Tab By Mouth Daily 5)  Tramadol Hcl 50 Mg Tabs (Tramadol Hcl) .... Sig: Take 1 Tab By Mouth Every 6 Hours For Pain in Leg 6)  Aspirin 81 Mg Tbec (Aspirin) .... One Tab By Mouth Daily  Allergies (verified): 1)  ! Cipro  Review of Systems       See HPI  Physical Exam  General:  Well-developed,well-nourished,in no acute distress; alert,appropriate and cooperative throughout examination Skin:  R LE with well demarcated swelling, No purulence, hemosiderin discoloration over anterior lower leg.  No longer TTP.  No warmth.   Feet unremarkable.  Hemosiderin deposits seen on medial aspect of mid lower leg on left side.   Impression & Recommendations:  Problem # 1:  UNSPECIFIED VENOUS INSUFFICIENCY (ICD-459.81) Assessment Improved  Weekly Unna boots. Pt to wear compression hose on other leg when up during the day. She did not have this on today. This is a possible culprit for her recurrent  cellulitis.  Orders: [facility]- Est Level  3 (16109)  Orders: [facility]- Est  Level 4 (60454)  Problem # 2:  CELLULITIS, LEG, RIGHT (ICD-682.6) Assessment: Improved  Recurrent but currently resolved. Finished clinda/suprax, covers MRSA, anaerobes, and pseudomonas. Wound Cx Neg Biopsy suggestive of stasis and resolving cellulitis, no signs vasculitis, calciphylaxis. Unna boot reapplied today, pt to RTC weekly for unna boot changes.  Orders: [facility]- Est Level  3 (09811)  Orders: [facility]- Est  Level 4 (91478)  Problem # 3:  HYPERTENSION, BENIGN ESSENTIAL (ICD-401.1) Assessment: Improved At goal.  Her updated medication list for this problem includes:    Lisinopril 40 Mg Tabs (Lisinopril) ..... One tab by mouth daily  Orders: [facility]- Est  Level 4 (29562)  Problem # 4:  DIABETES MELLITUS, TYPE II (ICD-250.00)  Her updated medication list for this problem includes:    Metformin Hcl 500 Mg Tabs (Metformin hcl) ..... Sig: take 1 tablet by mouth twice daily    Lisinopril 40 Mg Tabs (Lisinopril) ..... One tab by mouth daily    Aspirin 81 Mg Tbec (Aspirin) ..... One tab by mouth daily  Orders: [facility]- Est  Level 4 (99214)  Problem # 5:  HYPOTHYROIDISM (ICD-244.9) Assessment: Unchanged  Rechecking TSH.  Her updated medication list for this problem includes:    Levothroid 150 Mcg Tabs (Levothyroxine sodium) ..... One tab by mouth daily  Orders: TSH-[facility] (13086-57846)  Complete Medication List: 1)  Simvastatin 80  Mg Tabs (Simvastatin) .... Sig: take 1 tab by mouth at hs 2)  Metformin Hcl 500 Mg Tabs (Metformin hcl) .... Sig: take 1 tablet by mouth twice daily 3)  Lisinopril 40 Mg Tabs (Lisinopril) .... One tab by mouth daily 4)  Levothroid 150 Mcg Tabs (Levothyroxine sodium) .... One tab by mouth daily 5)  Tramadol Hcl 50 Mg Tabs (Tramadol hcl) .... Sig: take 1 tab by mouth every 6 hours for pain in leg 6)  Aspirin 81 Mg Tbec (Aspirin) .... One tab by mouth daily   Orders Added: 1)  [facility]- Est   Level 4 [99214] 2)  TSH-[facility] [[id number]]    Laboratory Results   Blood Tests   Date/Time Received: July 07, 2010 10:52 AM  Date/Time Reported: July 07, 2010 11:18 AM   HGBA1C: 5.9%   (Normal Range: Non-Diabetic - 3-6%   Control Diabetic - 6-8%)  Comments: ...............test performed by......Marland KitchenBonnie A. [place], MLS (ASCP)cm

## 2010-09-06 NOTE — Assessment & Plan Note (Signed)
Summary: Hospital H&P   Primary Provider:  Rodney Langton MD  CC:  Swollen leg.  History of Present Illness: Patient is a 50 yo f with past medical history of DMII, HLD, HTN and hypothyroidism who presented the [facility] with RLE redness and swelling.  The redness and swelling started this am and was sudden in onset. She has recently had cellulitis of the left leg that she states started the same way.  She states that yesterday she didn't feel good and was nauseated but did not have any redness or swelling of the RLE.  There is an area on the anterior RLE that she states she has been "picking at."  She denies drainage from this area.   She has not measured her temperature at home but states she felt cold yesterday and has felt hot all day today.  She states it is painful to bear weight on that leg.  She does have associated headache and has had cough x4 days.  She denies chest pain, shortness of breath, diarrhea, urinary symptoms.    Current Medications (verified): 1)  Simvastatin 80 Mg Tabs (Simvastatin) .... Sig: Take 1 Tab By Mouth At [facility] 2)  Metformin Hcl 500 Mg Tabs (Metformin Hcl) .... Sig: Take 1 Tablet By Mouth Twice Daily 3)  Lisinopril 40 Mg Tabs (Lisinopril) .... One Tab By Mouth Daily 4)  Levothroid 150 Mcg Tabs (Levothyroxine Sodium) .... One Tab By Mouth Daily 5)  Tramadol Hcl 50 Mg Tabs (Tramadol Hcl) .... Sig: Take 1 Tab By Mouth Every 6 Hours For Pain in Leg 6)  Aspirin 81 Mg Tbec (Aspirin) .... One Tab By Mouth Daily  Allergies (verified): No Known Drug Allergies  Past History:  Family History: Last updated: 02/10/2010 Father with Colon Ca at 63.  Social History: Last updated: 02/10/2010 Smokes 10 cigs a day [employer].  Past Medical History: DM2 HTN Hx LLE cellulitis Hypothyroid HLD  Past Surgical History: Cholecystectomy C-Section  Review of Systems       Pertinent positives and negatives noted in HPI, Vitals signs noted   Physical Exam  General:   Temp: 101.0, HR 86,  BP 141/75, RR: 22, 96% RA alert and well-hydrated, obese, nad Head:  Normocephalic and atraumatic without obvious abnormalities.  Eyes:  No corneal or conjunctival inflammation noted. EOMI. Perrla. Vision grossly normal. Mouth:  Oral mucosa and oropharynx without lesions or exudates.  Teeth in good repair. Neck:  No deformities, masses, or tenderness noted, FROM, no nuchal ridgidity Lungs:  Normal respiratory effort, chest expands symmetrically. Lungs are clear to auscultation, no crackles or wheezes. Heart:  Normal rate and regular rhythm. S1 and S2 normal without gallop, murmur, click, rub or other extra sounds. Abdomen:  Bowel sounds positive,abdomen soft and non-tender  Pulses:  dorsalis pedis and posterior tibial pulses are full and equal bilaterally Extremities:  RLE with warmth and erythema extending from top of ankle to just below knee joint.  Erythema does not cross the joint.  No crepitus felt. There is a small area on the anterior  RLE that is scabbed over without drainage.  Wound margins marked proximally.  LLE with mild edema, no warmth or erythema present Neurologic:  No cranial nerve deficits noted. Station and gait are normal Sensory, motor and coordinative functions appear intact. Skin:  As described above, also with petechial rash on face Additional Exam:  Labs:  15.1>12.5/37.1<195 135/3.6/105/19/13/1.0<131     Impression & Recommendations:  Problem # 1:  CELLULITIS AND ABSCESS OF LEG  EXCEPT FOOT (ICD-682.6) Patient at high risk of infection with co-morbidity of diabetes.  Sudden onset and spread concerning for necrotizing fasciitis, however no crepitus on exam.  Will admit and treat with vancomycin dosed per pharmacy for MRSA coverage and will add on clindamycin for strep coverage as well.  Will not get a CT at this point, as no crepitus found on exam.  If this continues to worsen on above antibiotics will consider obtaining CT and surgical consult for  possible debridement.  Will also get wound care consult for area on anterior leg or for leg wrapping to reduce edema.   Problem # 2:  DIABETES MELLITUS, TYPE II (ICD-250.00) Will continue her on her home metformin and place on sensitive sliding scale.  Last A1C in 7/11 was 5.9, so good control previously.  Will recheck to see how her control is currently. Her updated medication list for this problem includes:    Metformin Hcl 500 Mg Tabs (Metformin hcl) ..... Sig: take 1 tablet by mouth twice daily    Lisinopril 40 Mg Tabs (Lisinopril) ..... One tab by mouth daily    Aspirin 81 Mg Tbec (Aspirin) ..... One tab by mouth daily  Problem # 3:  HYPERTENSION, BENIGN ESSENTIAL (ICD-401.1) Will continue her home lisinopril and add on additional agents as needed.  Her updated medication list for this problem includes:    Lisinopril 40 Mg Tabs (Lisinopril) ..... One tab by mouth daily  Problem # 4:  HYPOTHYROIDISM (ICD-244.9) Will continue her on her current dose of synthroid while here Her updated medication list for this problem includes:    Levothroid 150 Mcg Tabs (Levothyroxine sodium) ..... One tab by mouth daily  Problem # 5:  HYPERLIPIDEMIA (ICD-272.4) Will continue her home simvastatin as listed below Her updated medication list for this problem includes:    Simvastatin 80 Mg Tabs (Simvastatin) ..... Sig: take 1 tab by mouth at hs  Problem # 6:  FEN/GI NS @ 178ml/hr, CHO Modified diet  Problem # 7:  PPX Heparin 5000units tid  Problem # 8:  Dispo Pending clinical improvement  Complete Medication List: 1)  Simvastatin 80 Mg Tabs (Simvastatin) .... Sig: take 1 tab by mouth at hs 2)  Metformin Hcl 500 Mg Tabs (Metformin hcl) .... Sig: take 1 tablet by mouth twice daily 3)  Lisinopril 40 Mg Tabs (Lisinopril) .... One tab by mouth daily 4)  Levothroid 150 Mcg Tabs (Levothyroxine sodium) .... One tab by mouth daily 5)  Tramadol Hcl 50 Mg Tabs (Tramadol hcl) .... Sig: take 1 tab by mouth  every 6 hours for pain in leg 6)  Aspirin 81 Mg Tbec (Aspirin) .... One tab by mouth daily  Appended Document: Hospital H&P I have seen and examined this patient with Dr. Ashley Royalty and agree with his findings.  In short, this is a 50 y/o F who comes in with another episode of cellulites. She remarks that her cellulitis usually comes on very quickly and that today is similar to her previous episodes. She woke up with morning and had a little pain in the leg but there was no redness so she went to work. By the time she was done with work she was ready to go to the ED because the pain was so bad she could hardly walk on it and it had become very red. Of note, she has an open sore on her leg that she has been picking at for 3-4 months.   Please see PGY1 note for  further details and histories. PE: Gen: lying in bed, not in acute distress HEENT: Pt c/o sensitivity to light and headache.  CV: RRR, no murmur Pulm: Clear to auscultation bilaterally Ext: erythema and warmth from just above ankle to just below knee. Marked by EDP. no crepitis felt, tender to the touch, 2 cm rounded scab in place on anterior shin.   A/P: Cellulitis: Pt has DM2 and there is concern about nec fasc but at this point since there is no crepitis and she has a h/o other infections behaving the exact same way we will start her on Abx and discuss with attending. Will have low threshhold for doing CT and calling surgery for debridment.   DM2: Pt is on Metformin at home and her last A1c was 5.9. She will cont on her home meds.   Hypertension: Pt is on Lisinopril for hypertension and for DM2 kidney protection. Will cont home med.   Hypothyroidism: Cont home Synthroid.  HLD: Cont home Simvastatin FEN/GI: Diabetic diet Prophy: Heparin Dispo: pending improvement of celluitis and nec fasc being ruled out.

## 2010-09-06 NOTE — Progress Notes (Signed)
Summary: triage  Phone Note Call from Patient Call back at Home Phone [phone]   Caller: Patient Summary of Call: was given meds in hosp and now home - she is broken out w/ rash on chest.    also, leg is red and warm to touch. Initial call taken by: De Nurse,  June 17, 2010 9:38 AM  Follow-up for Phone Call        patient states opposite leg from the one she was admitted for cellulitis for, just this morning is red and warm to touch. also she has rash on arms and chest. has been given meds in hospital that she is concerned that may be causing. since our schedule is full this AM and we only have an available appointment for work in this afternoon advisd her to go on to urgent care for evaluation now. Theresia Lo RN  June 17, 2010 9:47 AM  Follow-up by: Theresia Lo RN,  June 17, 2010 9:47 AM

## 2010-09-06 NOTE — Miscellaneous (Signed)
Summary: Consent: skin biopsy  Consent: skin biopsy   Imported By: Knox Royalty 06/23/2010 16:25:02  _____________________________________________________________________  External Attachment:    Type:   Image     Comment:   External Document

## 2010-09-06 NOTE — Assessment & Plan Note (Signed)
Summary: f/u per thekla/eo   Vital Signs:  Patient profile:   50 year old female Height:      61.5 inches Weight:      252.9 pounds BMI:     47.18 Temp:     98.4 degrees F oral Pulse rate:   82 / minute BP sitting:   129 / 80  (left arm) Cuff size:   large  Vitals Entered By: Jimmy Footman, CMA (July 15, 2010 8:53 AM) CC: remove uniboot Is Patient Diabetic? Yes Did you bring your meter with you today? No Pain Assessment Patient in pain? no        Primary Care Brittanee Ghazarian:  Rodney Langton MD  CC:  remove uniboot.  History of Present Illness: 50 yo female with DM2 and multiple episodes of cellulitis req hospitalization here for fu.  Her R leg has cellulitis over the entire ant aspect of her lower leg.  She was on Vanc/Zosyn/diflucan in the hospital, it is overall getting worse per pt.  Also s/p a course of Suprax at the [facility] as she had a reaction to cipro.   Unna boot #3 placed at last visit and biopsy obtained to assess for vasculitis/calciphylaxis or other obscure process.  Results of bx indicated resolving cellulitis and venous stasis.  Pt doing well with Unna boot.  Pain improved.  Swelling improved.    Current Medications (verified): 1)  Simvastatin 80 Mg Tabs (Simvastatin) .... Sig: Take 1 Tab By Mouth At [facility] 2)  Metformin Hcl 500 Mg Tabs (Metformin Hcl) .... One Tab By Mouth Daily. 3)  Lisinopril 40 Mg Tabs (Lisinopril) .... One Tab By Mouth Daily 4)  Levothroid 150 Mcg Tabs (Levothyroxine Sodium) .... One Tab By Mouth Daily 5)  Tramadol Hcl 50 Mg Tabs (Tramadol Hcl) .... Sig: Take 1 Tab By Mouth Every 6 Hours For Pain in Leg 6)  Aspirin 81 Mg Tbec (Aspirin) .... One Tab By Mouth Daily  Allergies (verified): 1)  ! Cipro  Review of Systems       See HPI  Physical Exam  General:  Well-developed,well-nourished,in no acute distress; alert,appropriate and cooperative throughout examination Skin:  R LE continues to improve with no further swelling, No purulence,  hemosiderin discoloration over anterior lower leg.  No longer TTP.  No warmth.   Feet unremarkable.  Hemosiderin deposits seen on medial aspect of mid lower leg on left side.  Pt wearing compression hose today.   Impression & Recommendations:  Problem # 1:  UNSPECIFIED VENOUS INSUFFICIENCY (ICD-459.81) Assessment Improved Weekly Unna boots. Pt to wear compression hose on other leg when up during the day. She did have this on today. This is a possible culprit for her recurrent cellulitis. If cont to improve at next visit can switch to compression hose only and DC unna boots.  Orders: [facility]- Est  Level 4 (81829)  Problem # 2:  CELLULITIS, LEG, RIGHT (ICD-682.6) Assessment: Improved Recurrent but currently resolved. Finished clinda/suprax, covers MRSA, anaerobes, and pseudomonas. Wound Cx Neg Biopsy suggestive of stasis and resolving cellulitis, no signs vasculitis, calciphylaxis. Unna boot reapplied today, pt to RTC weekly for unna boot changes.  Orders: [facility]- Est  Level 4 (93716)  Problem # 3:  DIABETES MELLITUS, TYPE II (ICD-250.00) Assessment: Improved A1c in the 5's. Decreased metformin to 500mg  once daily. Will recheck A1c in 3 months, if still controlled then can DC metformin altogether.  Her updated medication list for this problem includes:    Metformin Hcl 500 Mg Tabs (  Metformin hcl) ..... One tab by mouth daily.    Lisinopril 40 Mg Tabs (Lisinopril) ..... One tab by mouth daily    Aspirin 81 Mg Tbec (Aspirin) ..... One tab by mouth daily  Complete Medication List: 1)  Simvastatin 80 Mg Tabs (Simvastatin) .... Sig: take 1 tab by mouth at hs 2)  Metformin Hcl 500 Mg Tabs (Metformin hcl) .... One tab by mouth daily. 3)  Lisinopril 40 Mg Tabs (Lisinopril) .... One tab by mouth daily 4)  Levothroid 150 Mcg Tabs (Levothyroxine sodium) .... One tab by mouth daily 5)  Tramadol Hcl 50 Mg Tabs (Tramadol hcl) .... Sig: take 1 tab by mouth every 6 hours for pain in leg 6)   Aspirin 81 Mg Tbec (Aspirin) .... One tab by mouth daily  Patient Instructions: 1)  Change metformin to daily. 2)  Come back next wed to recheck your leg and possibly change to compression hose only. 3)  -Dr. Karie Schwalbe.   Orders Added: 1)  [facility]- Est  Level 4 [[address]]    Last HGBA1C:  5.9 (07/07/2010 10:14:51 AM) HGBA1C Next Due:  6 mo Microalbumin Next Due:  1 yr

## 2010-09-06 NOTE — Miscellaneous (Signed)
  Clinical Lists Changes  Problems: Removed problem of VAGINAL DISCHARGE (ICD-623.5) Removed problem of SCREENING FOR MALIGNANT NEOPLASM OF THE CERVIX (ICD-V76.2) Removed problem of SPECIAL SCREENING MALIG NEOPLASMS OTHER SITES (ICD-V76.49) Removed problem of CELLULITIS AND ABSCESS OF LEG EXCEPT FOOT (ICD-682.6)

## 2010-09-06 NOTE — Progress Notes (Signed)
 Summary: phn msg  Phone Note Call from Patient Call back at [facility] Phone [phone]   Caller: Patient Summary of Call: pt called to make appt for HFU - she said she needs to be seen in 1wk, but Dr T did not have anything until 3-23.  is this OK or should I double book?? Initial call taken by: Karna Seminole,  October 16, 2008 11:36 AM  Follow-up for Phone Call        Hey, ok to double book, just need to check wound. Follow-up by: Debby Petties MD,  October 16, 2008 1:35 PM

## 2010-09-06 NOTE — Consult Note (Signed)
 Summary: [facility]  [facility]   Imported By: Karna Seminole 12/31/2008 14:47:56  _____________________________________________________________________  External Attachment:    Type:   Image     Comment:   External Document

## 2010-09-06 NOTE — Letter (Signed)
Summary: Lipid Letter  [facility]  [address]   [place], [place] [address]   Phone: [phone]  Fax: [phone]    02/10/2010  Gail Cruz [address] [place], [place]  [address]  Dear Gail Cruz:  We have carefully reviewed your last lipid profile from 02/10/2010 and the results are noted below with a summary of recommendations for lipid management.    Cholesterol:       187     Goal: <200   HDL "good" Cholesterol:   43     Goal: >40   LDL "bad" Cholesterol:   123     Goal: <100   Triglycerides:       106     Goal: <150    You rnumbers are good.  Your LDL could come down a little, see below for TLCs on how to do this.    TLC Diet (Therapeutic Lifestyle Change): Saturated Fats & Transfatty acids should be kept < 7% of total calories ***Reduce Saturated Fats Polyunstaurated Fat can be up to 10% of total calories Monounsaturated Fat Fat can be up to 20% of total calories Total Fat should be no greater than 25-35% of total calories Carbohydrates should be 50-60% of total calories Protein should be approximately 15% of total calories Fiber should be at least 20-30 grams a day ***Increased fiber may help lower LDL Total Cholesterol should be < 200mg /day Consider adding plant stanol/sterols to diet (example: Benacol spread) ***A higher intake of unsaturated fat may reduce Triglycerides and Increase HDL    Adjunctive Measures (may lower LIPIDS and reduce risk of Heart Attack) include: Aerobic Exercise (20-30 minutes 3-4 times a week) Limit Alcohol Consumption Weight Reduction Aspirin 75-81 mg a day by mouth (if not allergic or contraindicated) Dietary Fiber 20-30 grams a day by mouth     Current Medications: 1)    Simvastatin 80 Mg Tabs (Simvastatin) .... Sig: take 1 tab by mouth at hs 2)    Metformin Hcl 500 Mg Tabs (Metformin hcl) .... Sig: take 1 tablet by mouth twice daily 3)    Lisinopril 20 Mg Tabs (Lisinopril) .... One tab by mouth daily 4)     Levothroid 175 Mcg Tabs (Levothyroxine sodium) .... Sig: take 1 tab by mouth once daily 5)    Tramadol Hcl 50 Mg Tabs (Tramadol hcl) .... Sig: take 1 tab by mouth every 6 hours for pain in leg 6)    Aspirin 81 Mg Tbec (Aspirin) .... One tab by mouth daily  If you have any questions, please call. We appreciate being able to work with you.   Sincerely,    [facility] Rodney Langton MD  Appended Document: Lipid Letter mailed

## 2010-09-06 NOTE — Letter (Signed)
Summary: Handout Printed  Printed Handout:  - Venous Stasis & Chronic Venous Insufficiency

## 2010-09-06 NOTE — Consult Note (Signed)
 Summary: [facility]  [facility]   Imported By: Karna Seminole 01/06/2009 14:55:17  _____________________________________________________________________  External Attachment:    Type:   Image     Comment:   External Document

## 2010-09-06 NOTE — Assessment & Plan Note (Signed)
Summary: Behavioral Medicine Student Consultation   Primary Care Provider:  Rodney Langton MD   History of Present Illness: I met with patient regarding smoking cessation.  She currently smokes about a half a pack per day.  In the past, she has been a "chain smoker."  Allergies: No Known Drug Allergies   Impression & Recommendations:  Problem # 1:  SMOKER (ICD-305.1) Patient appears to be in the contemplation stage of change regarding smoking cessation.  On a scale of 1-10, patient rated her motivation to stop smoking as a 10; however, she had very little confidence in her ability to quit.  As a result, she does not seem to have given serious thought, recently, to quitting.  In the past, she has tried to quit, but has only really been successful when in a situation that did not allow her to smoke (e.g., when hospitalized).  When I asked her about her attempts to quit, patient said that she looked into using a patch (seemed to help when she was hospitalized), but found that it was too expensive ( ~$50).  I asked her to consider how much money she spends per year on cigarettes.  She estimated upwards of $600.  I suggested that she try to cut down some on smoking and put back the money saved for a smoking cessation aid.  Her biggest motivator, at present, seems to be the potential for health problems related to smoking.  Her barriers to quitting include weight gain and loss of her mechanism for coping with stress.  She also said that smoking was a habit for her and she thought it would be very difficult to break that habit.  Patient mentioned that she knew about the smoking cessation course, but said that she was unable to attend due  to work.  Given her strong motivation to quit, I suggested that she talk to her PCP about smoking cessation aids.  I also gave her a tracking table to keep with her pack of cigarettes, which may aid her in considering her craving intensity, etc before smoking a  cigarette.  Patient may not initiate conversation about smoking cessation aids at her next PCP appointment, so her PCP may wish to initiate this conversation with her, particularly given her level of motivation to quit.   Gail Cruz  Behavioral Medicine Student  March 02, 2010 12:22 PM  Complete Medication List: 1)  Simvastatin 80 Mg Tabs (Simvastatin) .... Sig: take 1 tab by mouth at hs 2)  Metformin Hcl 500 Mg Tabs (Metformin hcl) .... Sig: take 1 tablet by mouth twice daily 3)  Lisinopril 40 Mg Tabs (Lisinopril) .... One tab by mouth daily 4)  Levothroid 150 Mcg Tabs (Levothyroxine sodium) .... One tab by mouth daily 5)  Tramadol Hcl 50 Mg Tabs (Tramadol hcl) .... Sig: take 1 tab by mouth every 6 hours for pain in leg 6)  Aspirin 81 Mg Tbec (Aspirin) .... One tab by mouth daily 7)  Diflucan 150 Mg Tabs (Fluconazole) .... One tab by mouth x1  Appended Document: Behavioral Medicine Student Consultation Reviewed encounter and note with [name].

## 2010-09-08 NOTE — Assessment & Plan Note (Signed)
Summary: F/U  PER DR T/KH   Vital Signs:  Patient profile:   50 year old female Weight:      252 pounds Temp:     98.9 degrees F oral Pulse rate:   90 / minute Pulse rhythm:   regular BP sitting:   115 / 75  Vitals Entered By: Loralee Pacas CMA (July 20, 2010 8:47 AM)  Primary Care Provider:  Rodney Langton MD   History of Present Illness: 50 yo female with DM2 and multiple episodes of cellulitis req hospitalization here for fu.  Her R leg had cellulitis over the entire ant aspect of her lower leg.  She was on Vanc/Zosyn/diflucan in the hospital. Also s/p a course of Suprax at the [facility] as she had a reaction to cipro.   Unna boot #4 placed at last visit and biopsy obtained to assess for vasculitis/calciphylaxis or other obscure process.  Results of bx indicated resolving cellulitis and venous stasis.  Pt doing well with Unna boot.  Pain improved.  Swelling improved.  She is wearing the compression hose on the other leg  Current Medications (verified): 1)  Simvastatin 80 Mg Tabs (Simvastatin) .... Sig: Take 1 Tab By Mouth At [facility] 2)  Metformin Hcl 500 Mg Tabs (Metformin Hcl) .... One Tab By Mouth Daily. 3)  Lisinopril 40 Mg Tabs (Lisinopril) .... One Tab By Mouth Daily 4)  Levothroid 150 Mcg Tabs (Levothyroxine Sodium) .... One Tab By Mouth Daily 5)  Tramadol Hcl 50 Mg Tabs (Tramadol Hcl) .... Sig: Take 1 Tab By Mouth Every 6 Hours For Pain in Leg 6)  Aspirin 81 Mg Tbec (Aspirin) .... One Tab By Mouth Daily  Allergies (verified): 1)  ! Cipro  Review of Systems       See HPI  Physical Exam  General:  Well-developed,well-nourished,in no acute distress; alert,appropriate and cooperative throughout examination Lungs:  Normal respiratory effort, chest expands symmetrically. Lungs are clear to auscultation, no crackles or wheezes. Heart:  Normal rate and regular rhythm. S1 and S2 normal without gallop, murmur, click, rub or other extra sounds. Extremities:  Now there is just  minimal erythema and hemosiderin deposits, leg is no longer swollen, scabbing nearly resolved.   Impression & Recommendations:  Problem # 1:  UNSPECIFIED VENOUS INSUFFICIENCY (ICD-459.81) Assessment Improved Resolved. May DC unna boots for now, pt to wear compression hose daily. Pt to wear compression hose on other leg when up during the day. She did have this on today. This is a possible culprit for her recurrent cellulitis. RTC 3 months to recheck and if worse will have to go back to unna boots.  Orders: [facility]- Est  Level 4 (30865)  Problem # 2:  CELLULITIS, LEG, RIGHT (ICD-682.6) Assessment: Improved Resolved, see #1.  Orders: [facility]- Est  Level 4 (78469)  Problem # 3:  DIABETES MELLITUS, TYPE II (ICD-250.00) Assessment: Improved Recheck A1c in 3 months, if controlled can DC metformin.  Her updated medication list for this problem includes:    Metformin Hcl 500 Mg Tabs (Metformin hcl) ..... One tab by mouth daily.    Lisinopril 40 Mg Tabs (Lisinopril) ..... One tab by mouth daily    Aspirin 81 Mg Tbec (Aspirin) ..... One tab by mouth daily  Orders: [facility]- Est  Level 4 (62952)  Problem # 4:  HYPERTENSION, BENIGN ESSENTIAL (ICD-401.1) Assessment: Improved Well controlled.  Her updated medication list for this problem includes:    Lisinopril 40 Mg Tabs (Lisinopril) ..... One tab by mouth daily  Orders: [facility]- Est  Level 4 ([address]109)  Complete Medication List: 1)  Simvastatin 80 Mg Tabs (Simvastatin) .... Sig: take 1 tab by mouth at hs 2)  Metformin Hcl 500 Mg Tabs (Metformin hcl) .... One tab by mouth daily. 3)  Lisinopril 40 Mg Tabs (Lisinopril) .... One tab by mouth daily 4)  Levothroid 150 Mcg Tabs (Levothyroxine sodium) .... One tab by mouth daily 5)  Tramadol Hcl 50 Mg Tabs (Tramadol hcl) .... Sig: take 1 tab by mouth every 6 hours for pain in leg 6)  Aspirin 81 Mg Tbec (Aspirin) .... One tab by mouth daily   Orders Added: 1)  [facility]- Est  Level 4 [60454]

## 2010-09-26 ENCOUNTER — Other Ambulatory Visit: Payer: Self-pay | Admitting: Family Medicine

## 2010-09-26 ENCOUNTER — Other Ambulatory Visit: Payer: Self-pay | Admitting: Sports Medicine

## 2010-09-26 DIAGNOSIS — Z1231 Encounter for screening mammogram for malignant neoplasm of breast: Secondary | ICD-10-CM

## 2010-10-18 LAB — COMPREHENSIVE METABOLIC PANEL
ALT: 41 U/L — ABNORMAL HIGH (ref 0–35)
AST: 37 U/L (ref 0–37)
Albumin: 2 g/dL — ABNORMAL LOW (ref 3.5–5.2)
Albumin: 2.4 g/dL — ABNORMAL LOW (ref 3.5–5.2)
Alkaline Phosphatase: 66 U/L (ref 39–117)
Alkaline Phosphatase: 69 U/L (ref 39–117)
BUN: 12 mg/dL (ref 6–23)
BUN: 4 mg/dL — ABNORMAL LOW (ref 6–23)
CO2: 21 mEq/L (ref 19–32)
Calcium: 7.8 mg/dL — ABNORMAL LOW (ref 8.4–10.5)
Chloride: 108 mEq/L (ref 96–112)
Chloride: 110 mEq/L (ref 96–112)
Creatinine, Ser: 0.78 mg/dL (ref 0.4–1.2)
Creatinine, Ser: 0.81 mg/dL (ref 0.4–1.2)
GFR calc Af Amer: >60 mL/min (ref >60)
GFR calc non Af Amer: >60 mL/min (ref >60)
Glucose, Bld: 112 mg/dL — ABNORMAL HIGH (ref 70–99)
Glucose, Bld: 91 mg/dL (ref 70–99)
Potassium: 3.5 mEq/L (ref 3.5–5.1)
Potassium: 3.6 mEq/L (ref 3.5–5.1)
Sodium: 138 mEq/L (ref 135–145)
Total Bilirubin: 0.5 mg/dL (ref 0.3–1.2)
Total Bilirubin: 1.2 mg/dL (ref 0.3–1.2)
Total Protein: 5.4 g/dL — ABNORMAL LOW (ref 6.0–8.3)
Total Protein: 5.6 g/dL — ABNORMAL LOW (ref 6.0–8.3)

## 2010-10-18 LAB — GLUCOSE, CAPILLARY
Glucose-Capillary: 102 mg/dL — ABNORMAL HIGH (ref 70–99)
Glucose-Capillary: 102 mg/dL — ABNORMAL HIGH (ref 70–99)
Glucose-Capillary: 104 mg/dL — ABNORMAL HIGH (ref 70–99)
Glucose-Capillary: 107 mg/dL — ABNORMAL HIGH (ref 70–99)
Glucose-Capillary: 120 mg/dL — ABNORMAL HIGH (ref 70–99)
Glucose-Capillary: 126 mg/dL — ABNORMAL HIGH (ref 70–99)
Glucose-Capillary: 127 mg/dL — ABNORMAL HIGH (ref 70–99)
Glucose-Capillary: 76 mg/dL (ref 70–99)
Glucose-Capillary: 86 mg/dL (ref 70–99)
Glucose-Capillary: 89 mg/dL (ref 70–99)

## 2010-10-18 LAB — CBC
HCT: 28 % — ABNORMAL LOW (ref 36.0–46.0)
HCT: 32.2 % — ABNORMAL LOW (ref 36.0–46.0)
HCT: 34.4 % — ABNORMAL LOW (ref 36.0–46.0)
HCT: 37.1 % (ref 36.0–46.0)
Hemoglobin: 10.3 g/dL — ABNORMAL LOW (ref 12.0–15.0)
Hemoglobin: 10.6 g/dL — ABNORMAL LOW (ref 12.0–15.0)
Hemoglobin: 12.5 g/dL (ref 12.0–15.0)
Hemoglobin: 9.3 g/dL — ABNORMAL LOW (ref 12.0–15.0)
MCH: 26.4 pg (ref 26.0–34.0)
MCH: 26.8 pg (ref 26.0–34.0)
MCH: 26.8 pg (ref 26.0–34.0)
MCH: 27 pg (ref 26.0–34.0)
MCH: 27.4 pg (ref 26.0–34.0)
MCHC: 32.9 g/dL (ref 30.0–36.0)
MCHC: 33.2 g/dL (ref 30.0–36.0)
MCHC: 33.7 g/dL (ref 30.0–36.0)
MCV: 80.3 fL (ref 78.0–100.0)
MCV: 80.7 fL (ref 78.0–100.0)
MCV: 80.8 fL (ref 78.0–100.0)
MCV: 81.2 fL (ref 78.0–100.0)
Platelets: 170 10*3/uL (ref 150–400)
Platelets: 195 10*3/uL (ref 150–400)
Platelets: 304 10*3/uL (ref 150–400)
RBC: 3.82 MIL/uL — ABNORMAL LOW (ref 3.87–5.11)
RBC: 4.01 MIL/uL (ref 3.87–5.11)
RBC: 4.57 MIL/uL (ref 3.87–5.11)
RDW: 14.4 % (ref 11.5–15.5)
RDW: 14.6 % (ref 11.5–15.5)
RDW: 15.3 % (ref 11.5–15.5)
RDW: 15.3 % (ref 11.5–15.5)
WBC: 11.2 10*3/uL — ABNORMAL HIGH (ref 4.0–10.5)
WBC: 15.1 10*3/uL — ABNORMAL HIGH (ref 4.0–10.5)
WBC: 6.8 10*3/uL (ref 4.0–10.5)

## 2010-10-18 LAB — CULTURE, BLOOD (ROUTINE X 2)
Culture  Setup Time: 201111061144
Culture  Setup Time: 201111061144
Culture: NO GROWTH
Culture: NO GROWTH

## 2010-10-18 LAB — DIFFERENTIAL
Basophils Absolute: 0 10*3/uL (ref 0.0–0.1)
Basophils Absolute: 0.1 10*3/uL (ref 0.0–0.1)
Basophils Relative: 0 % (ref 0–1)
Basophils Relative: 1 % (ref 0–1)
Eosinophils Absolute: 0 10*3/uL (ref 0.0–0.7)
Eosinophils Relative: 0 % (ref 0–5)
Lymphocytes Relative: 10 % — ABNORMAL LOW (ref 12–46)
Lymphocytes Relative: 7 % — ABNORMAL LOW (ref 12–46)
Lymphs Abs: 1.1 10*3/uL (ref 0.7–4.0)
Monocytes Absolute: 0.2 10*3/uL (ref 0.1–1.0)
Monocytes Absolute: 0.4 10*3/uL (ref 0.1–1.0)
Monocytes Relative: 3 % (ref 3–12)
Neutro Abs: 13.5 10*3/uL — ABNORMAL HIGH (ref 1.7–7.7)
Neutro Abs: 9.1 10*3/uL — ABNORMAL HIGH (ref 1.7–7.7)
Neutrophils Relative %: 85 % — ABNORMAL HIGH (ref 43–77)
Neutrophils Relative %: 90 % — ABNORMAL HIGH (ref 43–77)

## 2010-10-18 LAB — POCT I-STAT, CHEM 8
BUN: 13 mg/dL (ref 6–23)
Calcium, Ion: 1.07 mmol/L — ABNORMAL LOW (ref 1.12–1.32)
Chloride: 105 mEq/L (ref 96–112)
Creatinine, Ser: 1 mg/dL (ref 0.4–1.2)
Glucose, Bld: 131 mg/dL — ABNORMAL HIGH (ref 70–99)
HCT: 39 % (ref 36.0–46.0)
Hemoglobin: 13.3 g/dL (ref 12.0–15.0)
Potassium: 3.6 mEq/L (ref 3.5–5.1)
Sodium: 135 mEq/L (ref 135–145)
TCO2: 19 mmol/L (ref 0–100)

## 2010-10-18 LAB — HEMOGLOBIN A1C
Hgb A1c MFr Bld: 5.9 % — ABNORMAL HIGH (ref <5.7)
Mean Plasma Glucose: 123 mg/dL — ABNORMAL HIGH (ref <117)

## 2010-10-18 LAB — VANCOMYCIN, TROUGH: Vancomycin Tr: 16.2 ug/mL (ref 10.0–20.0)

## 2010-10-25 ENCOUNTER — Encounter: Payer: Self-pay | Admitting: Sports Medicine

## 2010-10-25 ENCOUNTER — Telehealth: Payer: Self-pay | Admitting: *Deleted

## 2010-10-25 ENCOUNTER — Ambulatory Visit (INDEPENDENT_AMBULATORY_CARE_PROVIDER_SITE_OTHER): Payer: Self-pay | Admitting: Sports Medicine

## 2010-10-25 VITALS — BP 125/82 | HR 103 | Temp 98.6°F | Ht 60.0 in | Wt 259.3 lb

## 2010-10-25 DIAGNOSIS — E785 Hyperlipidemia, unspecified: Secondary | ICD-10-CM

## 2010-10-25 DIAGNOSIS — E039 Hypothyroidism, unspecified: Secondary | ICD-10-CM

## 2010-10-25 DIAGNOSIS — I872 Venous insufficiency (chronic) (peripheral): Secondary | ICD-10-CM

## 2010-10-25 DIAGNOSIS — E119 Type 2 diabetes mellitus without complications: Secondary | ICD-10-CM

## 2010-10-25 DIAGNOSIS — I10 Essential (primary) hypertension: Secondary | ICD-10-CM

## 2010-10-25 LAB — LIPID PANEL
LDL Cholesterol: 124 mg/dL — ABNORMAL HIGH (ref 0–99)
Total CHOL/HDL Ratio: 4.7 Ratio
VLDL: 42 mg/dL — ABNORMAL HIGH (ref 0–40)

## 2010-10-25 LAB — COMPREHENSIVE METABOLIC PANEL
ALT: 12 U/L (ref 0–35)
AST: 15 U/L (ref 0–37)
Albumin: 4.1 g/dL (ref 3.5–5.2)
Alkaline Phosphatase: 78 U/L (ref 39–117)
Glucose, Bld: 83 mg/dL (ref 70–99)
Potassium: 4.2 mEq/L (ref 3.5–5.3)
Sodium: 137 mEq/L (ref 135–145)
Total Bilirubin: 0.6 mg/dL (ref 0.3–1.2)
Total Protein: 7 g/dL (ref 6.0–8.3)

## 2010-10-25 LAB — POCT GLYCOSYLATED HEMOGLOBIN (HGB A1C): Hemoglobin A1C: 6.1

## 2010-10-25 NOTE — Telephone Encounter (Signed)
LM with patient's mother for her to call back to inform of Dr. Lucienne Minks note below

## 2010-10-25 NOTE — Assessment & Plan Note (Signed)
She wants to try a few months off metformin. With an A1c of 6.1% I suspect this is worth a try. She will RTC 3 months to recheck A1c. If >6.5% will restart.

## 2010-10-25 NOTE — Assessment & Plan Note (Signed)
Checking TSH.

## 2010-10-25 NOTE — Telephone Encounter (Signed)
Spoke with patient and informed.

## 2010-10-25 NOTE — Progress Notes (Signed)
  Subjective:    Patient ID: Gail Cruz, female    DOB: 08-Sep-1960, 50 y.o.   MRN: [id number]  HPI DM2:  Would like to try to go off metformin.  A1c today 6.1%.    HTN: Well controlled.  HLD:  Checking FLP today.  Preventive medicine:  Has mammo coming up in April, will RTC for PAP.  LE cellulitis/venous stasis:  Healed. Not wearing compression hose today.   Review of Systems    See HPI Objective:   Physical Exam  Constitutional: She appears well-developed and well-nourished. No distress.  Cardiovascular: Normal rate, regular rhythm, normal heart sounds and intact distal pulses.  Exam reveals no gallop and no friction rub.   No murmur heard. Pulmonary/Chest: Effort normal and breath sounds normal. No respiratory distress. She has no wheezes. She has no rales. She exhibits no tenderness.  Skin: Skin is warm and dry. She is not diaphoretic.       Legs well healed, skin intact, no further eschar.  Noted areas of hemosiderin deposits just above ankles.          Assessment & Plan:

## 2010-10-25 NOTE — Assessment & Plan Note (Addendum)
FLP today. Uncontrolled, increasing to lipitor 80. Recheck in 3 months.

## 2010-10-25 NOTE — Patient Instructions (Signed)
Great to see you, Checking some bloodwork. Will let you know the results. Come back to see me in 3 months to do the PAP and reassess your diabetes.  -Dr. Karie Schwalbe.

## 2010-10-25 NOTE — Telephone Encounter (Signed)
Message copied by Jimmy Footman on Tue Oct 25, 2010  2:46 PM ------      Message from: Rodney Langton      Created: Tue Oct 25, 2010  2:22 PM       Can you call Jacari and let her know her A1c was 6.1%, she can try to go off the metformin for now, we will recheck her A1c when she sees me again in 3 months to see if she has to go back on.      Thanks!      -Dr. Karie Schwalbe.

## 2010-10-25 NOTE — Assessment & Plan Note (Signed)
Controlled. No changes needed. Checking Lytes.

## 2010-10-25 NOTE — Assessment & Plan Note (Signed)
Pt advised to wear LE compression hose whenever out and about or on feet to reduce risks or venous stasis and prevent recurrent cellulitic episodes that have landed her in the hospital in the past.

## 2010-10-26 ENCOUNTER — Encounter: Payer: Self-pay | Admitting: Sports Medicine

## 2010-10-26 MED ORDER — ATORVASTATIN CALCIUM 80 MG PO TABS: 80.0000 mg | ORAL_TABLET | Freq: Every day | ORAL | Status: DC

## 2010-10-26 NOTE — Progress Notes (Signed)
Addended by: Rodney Langton on: 10/26/2010 03:12 PM   Modules accepted: Orders

## 2010-10-27 ENCOUNTER — Telehealth: Payer: Self-pay | Admitting: Sports Medicine

## 2010-10-27 DIAGNOSIS — E785 Hyperlipidemia, unspecified: Secondary | ICD-10-CM

## 2010-10-27 MED ORDER — PRAVASTATIN SODIUM 40 MG PO TABS: 80.0000 mg | ORAL_TABLET | Freq: Every day | ORAL | Status: DC

## 2010-10-27 NOTE — Telephone Encounter (Signed)
Cholesterol med that was prescribed is too expensive, pt wants to know if MD can prescribe lovastin? Pt goes to [facility].

## 2010-10-27 NOTE — Telephone Encounter (Signed)
Changed to pravastatin 80, she should take one tab at bedtime for a week then 2 tabs at bedtime.  Needs to then come in for fasting lipids in 3 months.

## 2010-10-28 NOTE — Telephone Encounter (Signed)
Spoke with patient and informed.

## 2010-11-07 ENCOUNTER — Ambulatory Visit (HOSPITAL_COMMUNITY)
Admission: RE | Admit: 2010-11-07 | Discharge: 2010-11-07 | Disposition: A | Discharge status: Home / Self Care | Payer: Self-pay | Source: Ambulatory Visit | Attending: Family Medicine | Admitting: Family Medicine

## 2010-11-07 DIAGNOSIS — Z1231 Encounter for screening mammogram for malignant neoplasm of breast: Secondary | ICD-10-CM | POA: Insufficient documentation

## 2010-11-17 LAB — GLUCOSE, CAPILLARY
Glucose-Capillary: 101 mg/dL — ABNORMAL HIGH (ref 70–99)
Glucose-Capillary: 101 mg/dL — ABNORMAL HIGH (ref 70–99)
Glucose-Capillary: 103 mg/dL — ABNORMAL HIGH (ref 70–99)
Glucose-Capillary: 103 mg/dL — ABNORMAL HIGH (ref 70–99)
Glucose-Capillary: 105 mg/dL — ABNORMAL HIGH (ref 70–99)
Glucose-Capillary: 112 mg/dL — ABNORMAL HIGH (ref 70–99)
Glucose-Capillary: 120 mg/dL — ABNORMAL HIGH (ref 70–99)
Glucose-Capillary: 90 mg/dL (ref 70–99)
Glucose-Capillary: 92 mg/dL (ref 70–99)
Glucose-Capillary: 99 mg/dL (ref 70–99)

## 2010-11-17 LAB — CBC
HCT: 26.2 % — ABNORMAL LOW (ref 36.0–46.0)
HCT: 26.7 % — ABNORMAL LOW (ref 36.0–46.0)
HCT: 26.8 % — ABNORMAL LOW (ref 36.0–46.0)
HCT: 29.3 % — ABNORMAL LOW (ref 36.0–46.0)
Hemoglobin: 10.2 g/dL — ABNORMAL LOW (ref 12.0–15.0)
Hemoglobin: 10.9 g/dL — ABNORMAL LOW (ref 12.0–15.0)
Hemoglobin: 13.4 g/dL (ref 12.0–15.0)
Hemoglobin: 9.3 g/dL — ABNORMAL LOW (ref 12.0–15.0)
MCHC: 34.7 g/dL (ref 30.0–36.0)
MCHC: 34.7 g/dL (ref 30.0–36.0)
MCHC: 35.1 g/dL (ref 30.0–36.0)
MCHC: 35.8 g/dL (ref 30.0–36.0)
MCV: 90.5 fL (ref 78.0–100.0)
MCV: 91.5 fL (ref 78.0–100.0)
MCV: 91.6 fL (ref 78.0–100.0)
MCV: 91.9 fL (ref 78.0–100.0)
Platelets: 156 10*3/uL (ref 150–400)
Platelets: 250 10*3/uL (ref 150–400)
RBC: 2.9 MIL/uL — ABNORMAL LOW (ref 3.87–5.11)
RBC: 2.9 MIL/uL — ABNORMAL LOW (ref 3.87–5.11)
RBC: 2.91 MIL/uL — ABNORMAL LOW (ref 3.87–5.11)
RBC: 2.93 MIL/uL — ABNORMAL LOW (ref 3.87–5.11)
RBC: 3.18 MIL/uL — ABNORMAL LOW (ref 3.87–5.11)
RBC: 3.45 MIL/uL — ABNORMAL LOW (ref 3.87–5.11)
RBC: 4.22 MIL/uL (ref 3.87–5.11)
RDW: 16.6 % — ABNORMAL HIGH (ref 11.5–15.5)
RDW: 16.7 % — ABNORMAL HIGH (ref 11.5–15.5)
WBC: 10.4 10*3/uL (ref 4.0–10.5)
WBC: 11.4 10*3/uL — ABNORMAL HIGH (ref 4.0–10.5)
WBC: 11.9 10*3/uL — ABNORMAL HIGH (ref 4.0–10.5)
WBC: 7.8 10*3/uL (ref 4.0–10.5)
WBC: 9.1 10*3/uL (ref 4.0–10.5)

## 2010-11-17 LAB — HEMOGLOBIN A1C: Mean Plasma Glucose: 128 mg/dL

## 2010-11-17 LAB — URINALYSIS, ROUTINE W REFLEX MICROSCOPIC
Hgb urine dipstick: NEGATIVE
Specific Gravity, Urine: 1.024 (ref 1.005–1.030)
Urobilinogen, UA: 1 mg/dL (ref 0.0–1.0)
pH: 6 (ref 5.0–8.0)

## 2010-11-17 LAB — BASIC METABOLIC PANEL
BUN: 12 mg/dL (ref 6–23)
BUN: 13 mg/dL (ref 6–23)
BUN: 14 mg/dL (ref 6–23)
BUN: 3 mg/dL — ABNORMAL LOW (ref 6–23)
BUN: 7 mg/dL (ref 6–23)
CO2: 25 mEq/L (ref 19–32)
CO2: 26 mEq/L (ref 19–32)
CO2: 28 mEq/L (ref 19–32)
CO2: 28 mEq/L (ref 19–32)
Calcium: 7.8 mg/dL — ABNORMAL LOW (ref 8.4–10.5)
Calcium: 8.4 mg/dL (ref 8.4–10.5)
Chloride: 101 mEq/L (ref 96–112)
Chloride: 104 mEq/L (ref 96–112)
Chloride: 105 mEq/L (ref 96–112)
Chloride: 106 mEq/L (ref 96–112)
Chloride: 109 mEq/L (ref 96–112)
Creatinine, Ser: 1.08 mg/dL (ref 0.4–1.2)
Creatinine, Ser: 1.1 mg/dL (ref 0.4–1.2)
Creatinine, Ser: 1.31 mg/dL — ABNORMAL HIGH (ref 0.4–1.2)
GFR calc Af Amer: 49 mL/min — ABNORMAL LOW (ref >60)
GFR calc Af Amer: 55 mL/min — ABNORMAL LOW (ref >60)
GFR calc Af Amer: >60 mL/min (ref >60)
GFR calc Af Amer: >60 mL/min (ref >60)
GFR calc Af Amer: >60 mL/min (ref >60)
GFR calc non Af Amer: 40 mL/min — ABNORMAL LOW (ref >60)
Glucose, Bld: 109 mg/dL — ABNORMAL HIGH (ref 70–99)
Potassium: 3.1 mEq/L — ABNORMAL LOW (ref 3.5–5.1)
Potassium: 3.4 mEq/L — ABNORMAL LOW (ref 3.5–5.1)
Potassium: 3.6 mEq/L (ref 3.5–5.1)
Potassium: 3.7 mEq/L (ref 3.5–5.1)
Potassium: 4.9 mEq/L (ref 3.5–5.1)
Sodium: 135 mEq/L (ref 135–145)
Sodium: 135 mEq/L (ref 135–145)
Sodium: 140 mEq/L (ref 135–145)

## 2010-11-17 LAB — HIGH SENSITIVITY CRP
CRP, High Sensitivity: 213.7 mg/L — ABNORMAL HIGH
CRP, High Sensitivity: 278.2 mg/L — ABNORMAL HIGH

## 2010-11-17 LAB — LIPID PANEL
Cholesterol: 242 mg/dL — ABNORMAL HIGH (ref 0–200)
HDL: 40 mg/dL (ref >39)
LDL Cholesterol: 177 mg/dL — ABNORMAL HIGH (ref 0–99)
Triglycerides: 124 mg/dL (ref <150)
VLDL: 25 mg/dL (ref 0–40)

## 2010-11-17 LAB — POCT I-STAT, CHEM 8
Chloride: 100 mEq/L (ref 96–112)
Creatinine, Ser: 1.4 mg/dL — ABNORMAL HIGH (ref 0.4–1.2)
HCT: 42 % (ref 36.0–46.0)
Hemoglobin: 14.3 g/dL (ref 12.0–15.0)
Potassium: 4.4 mEq/L (ref 3.5–5.1)
Sodium: 136 mEq/L (ref 135–145)

## 2010-11-17 LAB — CULTURE, BLOOD (ROUTINE X 2): Culture: NO GROWTH

## 2010-11-17 LAB — BASIC METABOLIC PANEL WITH GFR
Calcium: 8.4 mg/dL (ref 8.4–10.5)
GFR calc Af Amer: 53 mL/min — ABNORMAL LOW (ref >60)
GFR calc non Af Amer: 44 mL/min — ABNORMAL LOW (ref >60)
Glucose, Bld: 103 mg/dL — ABNORMAL HIGH (ref 70–99)
Potassium: 3.5 meq/L (ref 3.5–5.1)
Sodium: 134 meq/L — ABNORMAL LOW (ref 135–145)

## 2010-11-17 LAB — DIFFERENTIAL
Basophils Relative: 0 % (ref 0–1)
Lymphocytes Relative: 7 % — ABNORMAL LOW (ref 12–46)
Lymphs Abs: 0.8 10*3/uL (ref 0.7–4.0)
Monocytes Absolute: 0 10*3/uL — ABNORMAL LOW (ref 0.1–1.0)
Monocytes Relative: 0 % — ABNORMAL LOW (ref 3–12)
Neutro Abs: 11 10*3/uL — ABNORMAL HIGH (ref 1.7–7.7)
Neutrophils Relative %: 93 % — ABNORMAL HIGH (ref 43–77)

## 2010-11-17 LAB — PROTIME-INR
INR: 1.1 (ref 0.00–1.49)
Prothrombin Time: 14.6 seconds (ref 11.6–15.2)

## 2010-11-17 LAB — C-REACTIVE PROTEIN: CRP: 27.7 mg/dL — ABNORMAL HIGH (ref <0.6)

## 2010-11-17 LAB — APTT: aPTT: 39 seconds — ABNORMAL HIGH (ref 24–37)

## 2010-11-17 LAB — POCT PREGNANCY, URINE: Preg Test, Ur: NEGATIVE

## 2010-11-28 ENCOUNTER — Other Ambulatory Visit: Payer: Self-pay | Admitting: Sports Medicine

## 2010-11-28 NOTE — Telephone Encounter (Signed)
Refill request

## 2010-12-20 NOTE — Discharge Summary (Signed)
NAMEJALENA, Gail Cruz               ACCOUNT NO.:  [id number]   MEDICAL RECORD NO.:  [id number]          PATIENT TYPE:  INP   LOCATION:  5501                         FACILITY:  [facility]   PHYSICIAN:  Pearlean Brownie, M.D.DATE OF BIRTH:  1960-12-16   DATE OF ADMISSION:  10/08/2008  DATE OF DISCHARGE:  10/14/2008                               DISCHARGE SUMMARY   DIAGNOSES AT ADMISSION:  1. Left lower extremity cellulitis.  2. Hypertension.  3. Hyperlipidemia.  4. Diabetes mellitus type 2.  5. History of superficial venous thrombosis.  6. Hypothyroidism.  7. History of depression with [facility] admission.   DIAGNOSES AT DISCHARGE:  1. Left lower extremity cellulitis is resolving.  2. Hypertension.  3. Hyperlipidemia.  4. Diabetes mellitus type 2.  5. History of superficial venous thrombosis.  6. Hypothyroidism.  7. History of depression with [facility] admission x1.   PROCEDURES DONE DURING ADMISSION:  The patient had a lower extremity  Doppler ultrasound in the ED that was negative for DVT, negative for  Baker cyst, negative for superficial venous thrombosis.  The patient had  a CT scan of the left lower extremity that showed diffuse left lower leg  skin and subcutaneous edema, inflammation without discrete fluid  collection or subcutaneous gas.   CONSULTS DURING ADMISSION:  [facility].   HISTORY:  Briefly, this is a 50 year old female with a history of  diabetes mellitus type 2, hypertension, hyperlipidemia, and pain,  swelling, and redness of the left leg since October 06, 2008.  No falls.  No bug bites.  The patient did not shave.  There was no other obvious  portals of injury.  The patient has occasionally fevers and chills at  admission, but no nausea and no vomiting.  The patient had been in the  clinical decision unit in the [facility] ED since October 07, 2008, and was  there overnight until October 08, 2008.  The patient was  receiving IV  Ancef; however, the erythematous portion of her left lower extremity  enlarged, past the drawn marker line, and the patient remained febrile,  and thus we were called to admit the patient.   HOSPITAL COURSE:  1. Cellulitis of the left lower extremity.  The patient was started on      vancomycin and Zosyn.  Zosyn was held on October 12, 2008, and the      patient was continued on vancomycin and the cellulitis continued to      shrink on just vancomycin.  The patient's vancomycin was      discontinued in the morning and doxycycline 100 mg p.o. b.i.d. was      started with the assumption that this was a Staphylococcus aureus      cellulitis and would be covered even MRSA by doxycycline.  Diflucan      p.o. was started on the night prior to discharge to cover for any      possible fungal organisms.  The patient's leg continued to do      better.  She remained afebrile throughout her admission and even  once switched to doxycycline.  On the day of discharge, the leg had      been Ace wrapped for approximately 24 hours and edema had decreased      significantly.  Pain had decreased significantly.  The patient was      ambulatory.  Border of erythema had shrunk much smaller than the      drawn border and blood cultures were negative x2.  2. Hypertension.  The patient was on lisinopril.  We held this      secondary to an increased creatinine; however, the patient's      creatinine returned to normal and we restarted the lisinopril.  The      patient's blood pressures remained well controlled throughout      admission.  3. Renal insufficiency.  This is likely prerenal and responded well to      hydration.  We were able to restart the patient's ACE inhibitor      during the admission.  4. Hyperlipidemia.  The patient's lipid panel was quite elevated with      a total cholesterol of 242, triglycerides of 124, HDL of 40, and      LDL of 177.  The patient's Zocor was increased from  20-80 mg per      day and she will be discharged on Zocor 80 mg daily.  This can be      followed up as an outpatient.  5. Diabetes mellitus type 2.  We held the patient's metformin during      her admission in the face of an infection and started her on      moderate sliding scale insulin.  Pressures remained well controlled      throughout admission.  Hemoglobin A1c was noted to be 6.1 on October 09, 2008.  She will be discharged back on the metformin 500 p.o.      b.i.d.  6. Hypothyroidism.  The patient did not know her home dose.  So, I      called the pharmacy, her home dose is 175 mcg daily.  TSH was      checked and was found to be significantly elevated at 53.  The      patient was given a script to refill and continue her Synthroid as      an outpatient.  TSH will need to be rechecked in approximately 6      weeks.   OTHER PERTINENT LABORATORY DATA:  The patient's C-reactive protein high  sensitivity decreased from 278 to 260 on the day of discharge, so this  downward trend is very reassuring.   DISCHARGE CONDITION:  Stable and improved.   DISCHARGE DISPOSITION:  To home.   MEDICATIONS AT DISCHARGE:  1. Doxycycline 100 mg p.o. b.i.d. x9 days, a prescription was given      for this.  2. Diflucan 150 mg p.o. daily x5 days, prescription was given for      this.  3. Lisinopril 10 mg p.o. daily.  4. Metformin 500 mg p.o. b.i.d.  5. Zocor 80 mg p.o. daily, prescription was given for this.  6. Synthroid 175 mcg p.o. daily, a prescription was given for this.  7. Ultram 50 mg p.o. q.6 h. p.r.n. pain, a prescription was given for      this.   The patient is instructed to call the [facility]  to make appointment with me, Dr. Rodney Langton in approximately 1  week for followup of her cellulitis.  The patient is also instructed and  has been  taught how to keep the left lower extremity Ace wrapped and is taught  how to Ace wrap it from the foot up  to the knee to aid in removal of  fluid.  She is also instructed to keep the leg elevated above the heart  as long as possible and as often as possible.      Rodney Langton, MD  Electronically Signed      Pearlean Brownie, M.D.  Electronically Signed    TT/MEDQ  D:  10/14/2008  T:  10/14/2008  Job:  [id number]
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
## 2010-12-20 NOTE — H&P (Signed)
NAME:  Gail Cruz, Gail Cruz NO.:  [id number]   MEDICAL RECORD NO.:  [id number]          PATIENT TYPE:  OBV   LOCATION:  1857                         FACILITY:  [facility]   PHYSICIAN:  Paula Compton, MD        DATE OF BIRTH:  1960/11/04   DATE OF ADMISSION:  10/07/2008  DATE OF DISCHARGE:                              HISTORY & PHYSICAL   PRIMARY CARE Cherene Dobbins:  The patient is unassigned.  She used to go to  [employer], but no longer sees them.   CHIEF COMPLAINT:  Left leg cellulitis.   HISTORY OF PRESENT ILLNESS:  This is a 50 year old female with a history  of diabetes mellitus type 2, hypertension, hyperlipidemia with pain,  swelling, and redness of the left leg since October 06, 2008.  No falls.  No bug bite. Does not shave.  No other obvious portals of entry.  Occasional fevers and chills.  No nausea or vomiting, had a superficial  venous thrombosis in the past that presented similarly, has been in the  clinical decision unit and in the [facility] ED since October 07, 2008, at  11 p.m., was getting IV Ancef, but the erythematous portion enlarged  past the drawn marker line at the original border of erythema, and thus  we were comfortable to admit the patient.   PAST MEDICAL HISTORY:  Diabetes mellitus type 2, depression with 1  [facility] admission, hypertension, hyperlipidemia,  hyperthyroidism, and history of left superficial venous thrombosis.   PAST SURGICAL HISTORY:  The patient has cesarean section and  laparoscopic cholecystectomy in the past.   FAMILY HISTORY:  Father with colon cancer.  In mother's side, there is  hypertension, hyperlipidemia, and diabetes.   SOCIAL HISTORY:  The patient smokes 1 pack per day times approximately  20 years, no alcohol, no drugs, does not desire to have nicotine patch  as well in the hospital.   MEDICATIONS:  1. Lisinopril 10 mg p.o. daily.  2. Zocor 20 mg p.o. daily.  3. Metformin 500 mg p.o.  b.i.d.  4. Synthroid.  The patient did not know her dose.   ALLERGIES:  NKDA.   REVIEW OF SYSTEMS:  A 12 point review of systems is negative except as  noted above in the HPI.   PHYSICAL EXAMINATION:  VITAL SIGNS:  Blood pressure 100/69, heart rate  100, respiratory rate 18, oxygen saturation 97% on room air, and  temperature 101.1 degrees Fahrenheit.  GENERAL: The patient is alert, oriented x3 in no acute distress.  HEENT:  Normocephalic, atraumatic.  Pupils are equal, round, and  reactive to light.  Extraocular muscles are intact.  Oral mucosa is  moist.  External auditory meatus is clear bilaterally with clear  tympanic membranes and nasal mucosa was pink.  NECK:  No cervical lymphadenopathy was palpable.  CARDIOVASCULAR:  Regular rate and rhythm.  No murmurs, rubs, or gallops.  PULMONARY:  Clear to auscultation bilaterally.  ABDOMEN:  Soft, obese, nontender, and nondistended with positive bowel  sounds.  No masses palpable.  EXTREMITIES:  The left lower extremities  are erythematous and indurated,  covering proximally the entire left lower leg below the knee and above  the ankle.  The right lower extremity is normal.  NEUROLOGIC:  Grossly nonfocal and there is no palpable inguinal  adenopathy.   LABORATORY DATA:  Urinalysis was within normal limits.  INR 1.1, PTT 39.  Urine pregnancy test is negative.  CBC showed a white count of 11.9,  hemoglobin 13.4, hematocrit 38.6, platelets 184, MCV is 91.4, RDW 16,  neutrophil left shift 93% and absolute neutrophil count of 11,000.  BMET  showed a sodium of 134, potassium 4.9, chloride 101, bicarb 28, BUN 13,  creatinine 1.27, glucose 138, calcium 8.9.  The patient had bilateral  lower extremity Dopplers in the ED that were both negative for DVT.   ASSESSMENT AND PLAN:  This is a 50 year old female with cellulitis.  1. Cellulitis of the left lower extremity.  The patient failed Ancef,      so we will admit for 24 hours of intravenous  vancomycin and then      transitioned to oral clindamycin or oral doxycycline.  We will also      check blood cultures x2.  2. Hypertension.  The patient is on lisinopril.  We are holding this      for this hospital admission secondary to increased creatinine.  3. Renal insufficiency.  We will observe for a decrease in creatinine      with morning labs after the patient has been hydrated, most likely      this is prerenal.  4. Hyperlipidemia.  The patient is on Zocor 20 mg p.o. daily.  5. Diabetes mellitus type 2, holding the metformin secondary to active      infection.  We will start the patient on moderate sliding scale      insulin and check hemoglobin A1c in the morning.  6. Hypothyroidism.  We will check the TSH before restarting Synthroid.  7. Disposition will be likely tomorrow or Saturday after appropriate      clinical response to intravenous antibiotics.  8. Prophylaxis.  The patient will be on Lovenox for deep vein      thrombosis prophylaxis and Protonix for gastrointestinal      prophylaxis.      Rodney Langton, MD  Electronically Signed      Paula Compton, MD  Electronically Signed    TT/MEDQ  D:  10/08/2008  T:  10/09/2008  Job:  [id number]

## 2010-12-20 NOTE — Assessment & Plan Note (Signed)
[facility]   NAME:  Gail Cruz, Gail Cruz               ACCOUNT NO.:  [id number]   MEDICAL RECORD NO.:  [id number]      DATE OF BIRTH:  Aug 08, 1960   PHYSICIAN:  Jonelle Sports. Sevier, M.D.       VISIT DATE:                                   OFFICE VISIT   HISTORY:  This 50 year old white female has been followed for rather  severe weeping crusty dermatitis of the left lower extremity in  association with obesity and some venous edema.  She was treated last  week with TCA cream inside an Unna wrap.   She arrives today with no particular complaints.  Her blood pressure is  126/84, pulse 91, respirations 18, temperature 98.3.  The left lower  extremity is remarkably cleaner, and the nurse is able to wipe away most  of the former crusts and loose skin.  She has 2 small open areas, one  posteriorly in the Achilles tendon area, measuring 1.8 x 2.9 x 0.1 cm.  and another on the left anterior pretibial area, measuring 1.5 x 0.5 x  0.1 cm.   IMPRESSION:  Considerable improvement in left lower extremity.   DISPOSITION:  The wounds require no debridement.  They will be dressed  with an application of Silverlon to each of those 2 sites, covered by a  protective pad, and that extremity will be again returned to an Unna  wrap.   Followup visit will be here in 1 week and it is quite possible we can  discharge her at that time.  We have already begun discussion regarding  20-30 compression hose, which probably will be all she can afford.           ______________________________  Jonelle Sports. Cheryll Cockayne, M.D.     RES/MEDQ  D:  11/04/2008  T:  11/04/2008  Job:  [id number]

## 2010-12-20 NOTE — Assessment & Plan Note (Signed)
[facility]   NAME:  Gail Cruz, Gail Cruz               ACCOUNT NO.:  [id number]   MEDICAL RECORD NO.:  [id number]      DATE OF BIRTH:  1961/04/19   PHYSICIAN:  Jonelle Sports. Sevier, M.D.  VISIT DATE:  11/11/2008                                   OFFICE VISIT   HISTORY:  This 50 year old white female is being followed for some  stasis dermatitis and one small open area on the posterior aspect of her  left lower extremity.   She had been treated with compressive wraps and Silverlon and has done  well.  She arrives today essentially healed with still a little  roughness in the posterior Achilles tendon area on that heel and with  some evidence of trauma from the wrinkles in her wrap but otherwise is  essentially healed.  Our plan today is to cover the Achilles tendon area  with Neosporin covered by self adherent Allevyn pad to place that  extremity in a Kerlix and Coban to the knee.  She will either today or  tomorrow go to the [facility] outlet and obtain 20-30 compression hose,  knee-high, open toed one pair to use on that extremity only which will  give her one to change out while she is laundering the first.   She will be discharged from this clinic and will return on a p.r.n.  basis.   For the record, her vital signs today blood pressure 115/78, pulse 85,  respirations 18, and temperature 98.3.           ______________________________  Jonelle Sports. Cheryll Cockayne, M.D.     RES/MEDQ  D:  11/11/2008  T:  11/12/2008  Job:  [id number]

## 2010-12-20 NOTE — Consult Note (Signed)
[name], [name]               ACCOUNT NO.:  [id number]   MEDICAL RECORD NO.:  [id number]          PATIENT TYPE:  INP   LOCATION:  5501                         FACILITY:  [facility]   PHYSICIAN:  Gail Dare. Janee Cruz, M.D.DATE OF BIRTH:  1961-01-18   DATE OF CONSULTATION:  10/10/2008  DATE OF DISCHARGE:                                 CONSULTATION   CHIEF COMPLAINT:  Left lower extremity cellulitis.   HISTORY OF PRESENT ILLNESS:  I was asked to evaluate [name] in  consultation regarding left lower extremity cellulitis by Dr. Mauricio Cruz.  She is a 50 year old white female with history of diabetes who was  admitted on October 07, 2008, for cellulitis of her left shin.  She was  placed on IV antibiotics, these have since been broadened to include  vancomycin and Zosyn.  She has not had significant improvement and we  are asked to evaluate for need for incision and drainage.   HISTORY:  Diabetes mellitus, depression, hypothyroidism, and  hypertension.   PAST SURGICAL HISTORY:  Cesarean section and laparoscopic  cholecystectomy.   SOCIAL HISTORY:  She smokes, but does not drink alcohol.   ALLERGIES:  No known drug allergies.   CURRENT ANTIBIOTICS:  Vancomycin and Zosyn.   REVIEW OF SYSTEMS:  MUSCULOSKELETAL:  Positive for localized pain in the  left lower extremity.  She does feel that the swelling goes up into her  popliteal fossa area.   PHYSICAL EXAMINATION:  VITAL SIGNS:  Temperature 98.1, blood pressure  165/82, heart rate 80, respirations 16, and saturations 99% on room air.  GENERAL:  She is awake and alert.  LUNGS:  Clear to auscultation.  HEART:  Regular with no murmurs.  ABDOMEN:  Soft and nontender.  MUSCULOSKELETAL:  Left shin with significant edema and erythema.  There  is mild tenderness there.  There is no crepitance palpable.  She does  have sensation in her foot.  Distal pulses are 1+.   White blood cell count is 7.8.  CT scan of the left lower extremity  today  shows diffuse subcutaneous edema with no abscess and no  subcutaneous emphysema noted.   IMPRESSION:  Cellulitis of the left lower extremity with no abscess or  subcutaneous emphysema seen.  Recommend continuing antibiotics and we  will follow her closely with you.      Gail Cruz, M.D.  Electronically Signed     BET/MEDQ  D:  10/10/2008  T:  10/11/2008  Job:  [id number]   cc:   Gail Compton, MD

## 2010-12-23 NOTE — Discharge Summary (Signed)
Gail Cruz, Gail Cruz NO.:  [id number]   MEDICAL RECORD NO.:  [id number]          PATIENT TYPE:  IPS   LOCATION:  0501                          FACILITY:  [facility]   PHYSICIAN:  Geoffery Lyons, M.D.      DATE OF BIRTH:  [birth year]   DATE OF ADMISSION:  05/17/2005  DATE OF DISCHARGE:  05/23/2005                                 DISCHARGE SUMMARY   CHIEF COMPLAINT AND PRESENT ILLNESS:  This was the first admission to [facility] for this 50 year old single white female.  History of  depression.  Wanting to disappear.  Multiple stressors, unemployed, no  electricity on the trailer, to be repossessed, sleeping too much, does not  want to get out of bed.   PAST PSYCHIATRIC HISTORY:  First time at [facility].  No outpatient  treatment.   ALCOHOL/DRUG HISTORY:  Denies any active use of substances.   MEDICAL HISTORY:  Hypothyroidism.   MEDICATIONS:  Synthroid 75 mcg daily, not taking since May, Zoloft 150 mg  per day, noncompliant.   PHYSICAL EXAMINATION:  Performed and failed to show any acute findings.   LABORATORY DATA:  White blood cells 7.7, hemoglobin 12.6.  Blood chemistry  with sodium 134, glucose 137, creatinine 1.4.  Liver enzymes with SGOT 64,  SGPT 60, total bilirubin 1.2, TSH 65.951, repeated at 57.527.   MENTAL STATUS EXAM:  Obese, cooperative, middle-aged female.  Casually  dressed.  Speech clear, normal rate, tempo and production.  Mood depressed.  Affect depressed.  Thought processes logical, coherent and relevant.  No  delusions, overwhelmed with the situation that she was in, feeling stressed  out.  No delusions.  No hallucinations.  Cognition was well-preserved.   ADMISSION DIAGNOSES:  AXIS I:  Major depression.  AXIS II:  No diagnosis.  AXIS III:  Hypothyroidism.  AXIS IV:  Moderate.  AXIS V:  GAF upon admission 35; highest GAF in the last year 60.   HOSPITAL COURSE:  She was admitted.  She was started in individual and  group  psychotherapy.  She was given Ambien for sleep.  She was given Ativan for  anxiety.  Her medications were resumed.  Given Synthroid 75 mcg daily.  Started on Effexor XR 37.5 mg, that was increased to 75 mg.  This was the  first inpatient stay.  Some outpatient treatment.  Had been on Prozac,  Wellbutrin, felt ineffective.  Presented on Zoloft.  Depressed mood,  anergia, anhedonia, severe financial stressors.  Denies active suicidal  ideation.  Was wanting to be there for her child.  Has been unable to work  for two years.  Financial difficulties.  Close to losing her home.  Made her  very upset.  She did not want to lose the place.  Her parents were trying to  see if they could help her.  Endorsed decreased energy, decreased  motivation.  We worked on Counsellor.  On May 22, 2005, was seeing herself as improving.  She was behind $4000 worth of  payments for the trailer.  They were  going to try to help her.  Apparently,  they got more time for her to put the money together but she was going to  move back with the parents and eventually let the trailer go.  Somewhat  anxious, upset, but able to talk about things.  Able to process and work on  her coping skills.  On October 17th, she was much better.  In full contact  with reality.  No suicidal or homicidal ideation.  No hallucinations.  No  delusions.  Committed to going better.  [employer] was going to be repossessed  in November but she felt she was ready to let it go.  Was willing to pursue  this in outpatient treatment.   DISCHARGE DIAGNOSES:  AXIS I:  Major depression, recurrent.  AXIS II:  No diagnosis.  AXIS III:  Hypothyroidism.  AXIS IV:  Moderate.  AXIS V:  GAF upon discharge 55.   DISCHARGE MEDICATIONS:  1.  Effexor XR 150 mg per day.  2.  Synthroid 75 mcg daily.   FOLLOW UP:  [employer] consultant.      Geoffery Lyons, M.D.  Electronically Signed     IL/MEDQ  D:  06/14/2005  T:   06/16/2005  Job:  [id number]

## 2010-12-23 NOTE — Discharge Summary (Signed)
NAMEALYVIAH, Gail Cruz               ACCOUNT NO.:  [id number]   MEDICAL RECORD NO.:  [id number]          PATIENT TYPE:  INP   LOCATION:  6729                         FACILITY:  [facility]   PHYSICIAN:  Dellia Beckwith, M.D. DATE OF BIRTH:  [birth year]   DATE OF ADMISSION:  10/20/2005  DATE OF DISCHARGE:  10/23/2005                                 DISCHARGE SUMMARY   ADDENDUM:   DISCHARGE DIAGNOSES:  As stated on previous discharge summary.   DISCHARGE MEDICATIONS:  1.  Levaquin 750 mg p.o. daily for six days.  2.  Lamisil AT 1% apply locally at left ankle for two weeks.  3.  Synthroid 0.175 mg p.o. daily.  4.  Effexor 150 mg p.o. daily.  5.  Metformin 500 mg p.o. daily.  6.  Flonase one spray per nostril b.i.d. x2 weeks.  7.  Lovenox 40 mg subcu daily for six days.  8.  Ibuprofen and/or acetaminophen for left ankle pain.   ADDENDUM:  [name] was kept in the hospital for two more days over the  weekend because of her left greater saphenous vein thrombosis.  We found a  study that suggested that with a proximal superficial thrombosis, it was  advised to treat with 8 to 12 days of anticoagulation, so we started her on  Lovenox 40 mg subcu daily and she will continue this for six days after  discharge for a total of 10 days.  Note that she was also started on  Metformin 500 mg p.o. to treat possible glucose intolerance versus diabetes.  This should be investigated by her primary physician at [facility].      Dellia Beckwith, M.D.     VD/MEDQ  D:  10/23/2005  T:  10/24/2005  Job:  [id number]   cc:   [facility]

## 2010-12-23 NOTE — Discharge Summary (Signed)
NAMEJAZALYNN, MIRELES               ACCOUNT NO.:  [id number]   MEDICAL RECORD NO.:  [id number]          PATIENT TYPE:  INP   LOCATION:  6729                         FACILITY:  [facility]   PHYSICIAN:  Dellia Beckwith, M.D. DATE OF BIRTH:  Apr 27, 1961   DATE OF ADMISSION:  10/20/2005  DATE OF DISCHARGE:  10/21/2005                                 DISCHARGE SUMMARY   PROCEDURE:  Lower extremity Doppler was performed to rule out DVT.  She had  no distal vein thrombosis but she did have a superficial greater saphenous  vein thrombus on the left.   DISCHARGE DIAGNOSES:  1.  Left lower extremity cellulitis, most likely secondary to tinea pedis on      the left ankle.  2.  Hypothyroidism.  3.  Depression.  4.  Superficial greater saphenous vein thrombus on the left.  5.  Possible glucose intolerance.   DISCHARGE MEDICATIONS:  1.  Levaquin 700 mg p.o. daily for 10 days.  2.  Lamisil AT 1% to apply at the left ankle b.i.d. for two weeks.  3.  Synthroid 0.175 mg p.o. daily.  4.  Effexor XR 150 mg p.o. daily.   DISPOSITION:  The patient was given a sample for 10 days of Levaquin 750 mg  for her cellulitis.  Her Synthroid dose was also increased from 150 to 175  mcg  daily for her uncontrolled hypothyroidism.  She is to follow up with  her primary care physician at [facility] within one or two  weeks.  I also suggest rechecking her TSH in four to six weeks.   HISTORY AND PHYSICAL:  Mrs. Rarick is a 50 year old white woman who had a  gradual onset of redness and warmth to the left lower leg that started the  day prior to admission.  She stated that the day prior to the redness and  warmth, she had some left ankle tenderness.  She denied any recent trauma  and denied any recent illness.  She denied fevers, chills, night sweats,  nausea, vomiting, diarrhea or any other symptoms.  A Doppler ultrasound of  her lower extremities done in the emergency room demonstrated a  superficial  thrombus to the greater saphenous vein on the left leg but no distal vein  thrombosis.  She denied any shortness of breath or chest pain.   PHYSICAL EXAMINATION:  GENERAL APPEARANCE:  She was in no acute distress.  VITAL SIGNS:  Temperature 97.4, blood pressure 138/94, pulse 87, oxygen  saturation 99% on room air, breathing at 20 per minute.  HEENT:  Pupils are equal, round, reactive to light and accommodation.  She  had no cervical adenopathy.  NECK:  Supple.  LUNGS:  Clear to auscultation bilaterally.  CARDIOVASCULAR:  Unremarkable.  ABDOMEN:  Soft, nontender and nondistended.  EXTREMITIES:  Her left lower leg had diffuse, very well demarcated erythema  and edema that went to the ankle to just below the knee.  She had mild  tenderness as well.  There was no abnormalities of the right lower  extremity.   LABORATORY DATA:  White count 9.7,  ANC 7.2, hemoglobin 12.6, platelets 236.  D-dimer was 0.36.  Sodium 135, potassium 3.6, chloride 103, CO2 25, BUN 12,  creatinine 1.1, glucose 120.  Bilirubin 0.9, alkaline phosphatase 76, AST  34, ALT 36, protein 7.1, albumin 3.6, calcium 9.2.  Hemoglobin A1c was  checked and was 6.2.  A TSH was 41.98 and T4 0.26.  Blood cultures were  drawn and was still pending at the time of discharge.   HOSPITAL COURSE:  The patient was admitted for IV treatment of her  cellulitis.  After only three doses of Unasyn, the erythema, swelling and  pain had markedly decreased.  It was decided to sent her home with 10 days  of Levaquin as well as Lamisil AT 1% for treatment of her tinea pedis which  was thought to be the entry site for her infection.  As for her  hypothyroidism, the patient was discharged with an increased dose of  Synthroid to 175 mcg daily.  At the time of discharge, a hypercoagulable  panel as well as blood cultures were still pending.  All of these issues  should be followed by her primary care physician at [facility] within one or two weeks and a TSH followed in four to six weeks.   VITAL SIGNS AT DISCHARGE:  Temperature 98.1, blood pressure 105/52, pulse  67, respiratory rate 20, oxygen saturation 99% on room air.      Dellia Beckwith, M.D.     VD/MEDQ  D:  10/21/2005  T:  10/23/2005  Job:  [id number]   cc:   [facility]

## 2011-01-18 ENCOUNTER — Ambulatory Visit (INDEPENDENT_AMBULATORY_CARE_PROVIDER_SITE_OTHER): Payer: Self-pay | Admitting: Sports Medicine

## 2011-01-18 ENCOUNTER — Encounter: Payer: Self-pay | Admitting: Sports Medicine

## 2011-01-18 ENCOUNTER — Other Ambulatory Visit (HOSPITAL_COMMUNITY)
Admission: RE | Admit: 2011-01-18 | Discharge: 2011-01-18 | Disposition: A | Discharge status: Home / Self Care | Payer: Self-pay | Source: Ambulatory Visit | Attending: Family Medicine | Admitting: Family Medicine

## 2011-01-18 DIAGNOSIS — Z23 Encounter for immunization: Secondary | ICD-10-CM

## 2011-01-18 DIAGNOSIS — E119 Type 2 diabetes mellitus without complications: Secondary | ICD-10-CM

## 2011-01-18 DIAGNOSIS — E039 Hypothyroidism, unspecified: Secondary | ICD-10-CM

## 2011-01-18 DIAGNOSIS — I10 Essential (primary) hypertension: Secondary | ICD-10-CM

## 2011-01-18 DIAGNOSIS — E785 Hyperlipidemia, unspecified: Secondary | ICD-10-CM

## 2011-01-18 DIAGNOSIS — Z01419 Encounter for gynecological examination (general) (routine) without abnormal findings: Secondary | ICD-10-CM | POA: Insufficient documentation

## 2011-01-18 DIAGNOSIS — R1900 Intra-abdominal and pelvic swelling, mass and lump, unspecified site: Secondary | ICD-10-CM

## 2011-01-18 DIAGNOSIS — Z299 Encounter for prophylactic measures, unspecified: Secondary | ICD-10-CM

## 2011-01-18 DIAGNOSIS — Z1210 Encounter for screening for malignant neoplasm of intestinal tract, unspecified: Secondary | ICD-10-CM

## 2011-01-18 LAB — LIPID PANEL
HDL: 42 mg/dL (ref >39)
Total CHOL/HDL Ratio: 5.1 Ratio
VLDL: 33 mg/dL (ref 0–40)

## 2011-01-18 LAB — COMPREHENSIVE METABOLIC PANEL
ALT: 16 U/L (ref 0–35)
Alkaline Phosphatase: 81 U/L (ref 39–117)
Sodium: 138 mEq/L (ref 135–145)
Total Bilirubin: 0.5 mg/dL (ref 0.3–1.2)
Total Protein: 6.5 g/dL (ref 6.0–8.3)

## 2011-01-18 LAB — TSH: TSH: 1.088 u[IU]/mL (ref 0.350–4.500)

## 2011-01-18 LAB — POCT UA - MICROALBUMIN
Creatinine, POC: 100 mg/dL
Microalbumin Ur, POC: 10 mg/dL

## 2011-01-18 NOTE — Assessment & Plan Note (Addendum)
Recheck A1c. Urine microalbumin. If >6.5% will restart metformin.

## 2011-01-18 NOTE — Assessment & Plan Note (Addendum)
Incr statin at last visit. Recheck lipid panel today.  Need to increase pravastatin to 80.

## 2011-01-18 NOTE — Assessment & Plan Note (Signed)
Controlled. Recheck TSH today.

## 2011-01-18 NOTE — Assessment & Plan Note (Signed)
PAP done, may space to q3 years if 2 more negatives. Mammo UTD. Td today. Colonoscopy at 52.

## 2011-01-18 NOTE — Progress Notes (Signed)
  Subjective:    Patient ID: REGGIE WELGE, female    DOB: 06-18-1961, 50 y.o.   MRN: [id number]  HPI  Pt here for CPE.  She is doing well other than her father recently passed.  She is coping well and has good family support.  He died from colon ca dx at age 50.  HTN:  Controlled.  HLD:  Due for FLP  DM2:  DUe for A1c.  Went off metformin 3 months ago.    Review of Systems    Neg except as for HPI Objective:   Physical Exam  Constitutional: She appears well-developed and well-nourished. No distress.  HENT:  Head: Normocephalic and atraumatic.  Cardiovascular: Normal rate, regular rhythm and normal heart sounds.  Exam reveals no gallop and no friction rub.   No murmur heard. Pulmonary/Chest: Effort normal and breath sounds normal. No respiratory distress. She has no wheezes. She has no rales. She exhibits no tenderness.  Abdominal: Soft. Bowel sounds are normal. She exhibits mass. She exhibits no distension. There is no tenderness. There is no rebound and no guarding.       12wk uterus.  Genitourinary: Vagina normal. There is no rash, tenderness or lesion on the right labia. There is no rash, tenderness or lesion on the left labia. Uterus is enlarged. Uterus is not deviated, not fixed and not tender. Cervix exhibits no motion tenderness, no discharge and no friability. Right adnexum displays no mass, no tenderness and no fullness. Left adnexum displays no mass, no tenderness and no fullness.       Approx 12wk uterus.          Assessment & Plan:

## 2011-01-18 NOTE — Assessment & Plan Note (Signed)
Still present but neg [place] last year. She does have a fibroid and an 8x4 cm uterus. No further workup.

## 2011-01-18 NOTE — Assessment & Plan Note (Signed)
BP ok today. Diastolic a little high but she has usually run with excellent BPs. No changes today.

## 2011-01-19 ENCOUNTER — Encounter: Payer: Self-pay | Admitting: Sports Medicine

## 2011-01-19 MED ORDER — PRAVASTATIN SODIUM 80 MG PO TABS: 80.0000 mg | ORAL_TABLET | Freq: Every day | ORAL | Status: DC

## 2011-01-19 NOTE — Progress Notes (Signed)
Addended by: Monica Becton on: 01/19/2011 08:47 AM   Modules accepted: Orders

## 2011-03-31 ENCOUNTER — Other Ambulatory Visit: Payer: Self-pay | Admitting: Family Medicine

## 2011-03-31 MED ORDER — LISINOPRIL 40 MG PO TABS: 40.0000 mg | ORAL_TABLET | Freq: Every day | ORAL | Status: DC

## 2011-10-17 ENCOUNTER — Other Ambulatory Visit: Payer: Self-pay | Admitting: Family Medicine

## 2011-10-17 DIAGNOSIS — Z1231 Encounter for screening mammogram for malignant neoplasm of breast: Secondary | ICD-10-CM

## 2011-11-24 ENCOUNTER — Ambulatory Visit (HOSPITAL_COMMUNITY)
Admission: RE | Admit: 2011-11-24 | Discharge: 2011-11-24 | Disposition: A | Discharge status: Home / Self Care | Payer: Self-pay | Source: Ambulatory Visit | Attending: Family Medicine | Admitting: Family Medicine

## 2011-11-24 DIAGNOSIS — Z1231 Encounter for screening mammogram for malignant neoplasm of breast: Secondary | ICD-10-CM

## 2012-02-12 ENCOUNTER — Encounter: Payer: Self-pay | Admitting: *Deleted

## 2012-02-14 ENCOUNTER — Telehealth: Payer: Self-pay | Admitting: *Deleted

## 2012-02-14 DIAGNOSIS — E039 Hypothyroidism, unspecified: Secondary | ICD-10-CM

## 2012-02-14 MED ORDER — LEVOTHYROXINE SODIUM 150 MCG PO TABS: 150.0000 ug | ORAL_TABLET | Freq: Every day | ORAL | Status: DC

## 2012-02-14 NOTE — Telephone Encounter (Signed)
Consulted with Dr.Breen . Patient last appointment here was 01/2011. Advised  may send in a month's supply and will need appointment before further refills. Message left on patient's voicemail.

## 2012-02-17 NOTE — Telephone Encounter (Signed)
This encounter was created in error - please disregard.

## 2012-03-06 ENCOUNTER — Ambulatory Visit: Payer: Self-pay | Admitting: Family Medicine

## 2012-03-20 ENCOUNTER — Encounter: Payer: Self-pay | Admitting: Family Medicine

## 2012-03-20 ENCOUNTER — Ambulatory Visit (INDEPENDENT_AMBULATORY_CARE_PROVIDER_SITE_OTHER): Payer: Self-pay | Admitting: Family Medicine

## 2012-03-20 VITALS — BP 134/83 | HR 90 | Temp 98.2°F | Ht 60.0 in | Wt 272.0 lb

## 2012-03-20 DIAGNOSIS — R21 Rash and other nonspecific skin eruption: Secondary | ICD-10-CM

## 2012-03-20 DIAGNOSIS — I10 Essential (primary) hypertension: Secondary | ICD-10-CM

## 2012-03-20 DIAGNOSIS — E039 Hypothyroidism, unspecified: Secondary | ICD-10-CM

## 2012-03-20 DIAGNOSIS — E785 Hyperlipidemia, unspecified: Secondary | ICD-10-CM

## 2012-03-20 DIAGNOSIS — E119 Type 2 diabetes mellitus without complications: Secondary | ICD-10-CM

## 2012-03-20 MED ORDER — LEVOTHYROXINE SODIUM 150 MCG PO TABS: 150.0000 ug | ORAL_TABLET | Freq: Every day | ORAL | Status: DC

## 2012-03-20 MED ORDER — PRAVASTATIN SODIUM 40 MG PO TABS: 80.0000 mg | ORAL_TABLET | Freq: Every day | ORAL | Status: DC

## 2012-03-20 MED ORDER — LISINOPRIL 40 MG PO TABS: 40.0000 mg | ORAL_TABLET | Freq: Every day | ORAL | Status: DC

## 2012-03-20 NOTE — Assessment & Plan Note (Addendum)
Etiology of rash unclear. Does not appear infected.  Will try topical hydrocortisone.  Patient informed to call clinic if rash does not improve.  We may need to give more potent steroid at that time or reevaluate in clinic. Patient instructed to take Motrin for associated headache.

## 2012-03-20 NOTE — Assessment & Plan Note (Signed)
Schedule patient for Lipid panel this week.  Refilled Pravastatin 80 mg daily.

## 2012-03-20 NOTE — Assessment & Plan Note (Signed)
Diet controlled.   A1C obtained today - 6.1  No therapy indicated at this time.  Continue healthy well balanced diet and exercise.

## 2012-03-20 NOTE — Patient Instructions (Addendum)
I have refilled your prescriptions (Lisinopril, Synthroid, and Pravastatin).  We checked your TSH (thyroid) today.  I will call or send a letter with the results.  We will change your dose of synthroid if necessary.  Apply hydrocortisone cream to the rash 2-3 times daily.  If it does not improve call or return to the clinic.  You can take Ibuprofen 400-800 mg for headache and neck pain.

## 2012-03-20 NOTE — Assessment & Plan Note (Signed)
Patient's BP today was 134/83, nearly at goal of 130/80.  Will continue Lisinopril 40 mg daily.

## 2012-03-20 NOTE — Assessment & Plan Note (Signed)
TSH obtained today.  Refilled synthroid.  Will adjust if needed based on TSH.

## 2012-03-20 NOTE — Progress Notes (Signed)
Subjective:     Patient ID: Gail Cruz, female   DOB: [birth year], 51 y.o.   MRN: [id number]  HPI 51 year old female with a PMH of HTN, Diabetes mellitus, hypothyroidism, and Hyperlipidemia who presents for follow up and medication refill. She also reports recent rash and headache.  1) HTN - Currently controlled with Lisinopril 40 mg daily - Needs refill  2) DM - Patient has not had A1C for over 1 year.  Last A1C was 5.7 on 6/12. - Patient is not currently on therapy and is diet controlled. - No reported symptoms (polyuria, polydipsia, weight loss)  3) Hypothyroidism - Patient is currently on Synthroid 150 mcg daily - Needs refill  4) Hyperlipidemia - Last lipid panel 6/12. Total cholesterol was 215, Triglycerides 167 and LDL of 140. - Currently on Pravachol 80 mg  5) Rash - Began on Saturday 8/10. Located on back of neck below hair line. - No inciting event or bug bite. - Reports some itching and discomfort with movement. - No associated fever.  Reports associated headache.  Review of Systems See HPI    Objective:   Physical Exam General:  Pleasant lady who appears stated age. NAD. Heart:  RRR. No murmurs, rubs, or gallops. Lungs: CTAB. Skin:  Erythematous, raised, patchy rash located on back of neck.  No drainage or pustules noted.      Assessment:        Plan:

## 2012-03-25 ENCOUNTER — Telehealth: Payer: Self-pay | Admitting: Family Medicine

## 2012-03-25 MED ORDER — LEVOTHYROXINE SODIUM 125 MCG PO TABS
125.0000 ug | ORAL_TABLET | Freq: Every day | ORAL | Status: DC
Start: 2012-03-25 — End: 2012-06-03

## 2012-03-25 NOTE — Telephone Encounter (Signed)
Called [name] with TSH results.  Informed her of low TSH (0.277).  Patient is asymptomatic.  Will decrease Levothyroxine to 125 mcg daily.   I also scheduled her an appointment with repeat TSH at that time (Sept 30).

## 2012-03-29 ENCOUNTER — Other Ambulatory Visit: Payer: Self-pay

## 2012-04-26 ENCOUNTER — Ambulatory Visit: Payer: Self-pay | Admitting: Family Medicine

## 2012-05-06 ENCOUNTER — Ambulatory Visit: Payer: Self-pay | Admitting: Family Medicine

## 2012-05-10 ENCOUNTER — Ambulatory Visit: Payer: Self-pay | Admitting: Family Medicine

## 2012-05-29 ENCOUNTER — Encounter: Payer: Self-pay | Admitting: Family Medicine

## 2012-05-29 ENCOUNTER — Ambulatory Visit (INDEPENDENT_AMBULATORY_CARE_PROVIDER_SITE_OTHER): Payer: Self-pay | Admitting: Family Medicine

## 2012-05-29 VITALS — BP 134/79 | HR 92 | Temp 98.1°F | Ht 60.0 in | Wt 281.6 lb

## 2012-05-29 DIAGNOSIS — E669 Obesity, unspecified: Secondary | ICD-10-CM

## 2012-05-29 DIAGNOSIS — E785 Hyperlipidemia, unspecified: Secondary | ICD-10-CM

## 2012-05-29 DIAGNOSIS — E119 Type 2 diabetes mellitus without complications: Secondary | ICD-10-CM

## 2012-05-29 DIAGNOSIS — I10 Essential (primary) hypertension: Secondary | ICD-10-CM

## 2012-05-29 DIAGNOSIS — E039 Hypothyroidism, unspecified: Secondary | ICD-10-CM

## 2012-05-29 NOTE — Assessment & Plan Note (Signed)
Last Lipid panel was 1 year ago.  Will have patient return in next 1-2 weeks for fasting lipid panel.  Patient will continue Pravastatin 80 mg daily.  Will alter statin therapy pending lipid panel results.

## 2012-05-29 NOTE — Assessment & Plan Note (Signed)
Patient's BP today is nearly at goal (134/79) of 130/80.  Will continue Lisinopril 40 mg daily.  Will consider addition of HCTZ at next visit.

## 2012-05-29 NOTE — Progress Notes (Signed)
Subjective:     Patient ID: Gail Cruz, female   DOB: 1960/09/12, 51 y.o.   MRN: [id number]  CC: Hypothyroidism, HTN follow up  HPI Ms. Gunkel is here today for follow up regarding her hypothyroidism, HTN, Hyperlipidemia, and DM-2. Patient also would like to discuss weight loss today.  1) Hypothyroidism - Patient feeling well.  No complaints.  Patient currently on Synthroid 125 mcg daily. - Denies recent weight changes, fatigue, constipation, cold intolerance.    2) HTN - Patient currently on Lisinopril 40 mg daily - Patient does not check her BP regularly and has been nearly at goal on prior visits.  3) Hyperlipidemia - Patient currently on Pravastatin 80 mg daily  - Patient reports that she does not exercise but tries to eat healthy.  However, patient reports that she often eats foods that are not good for her (Pop tarts, sweets, etc).  4) DM-2 - Patient has a history of Diabetes but is currently well controlled and patient is not on any medication at this point in time.  Review of Systems General: denies fatigue, weakness.  Also denies recent weight change. Cardiovascular: denies chest pain, palpitations Respiratory: denies SOB GI: denies nausea, vomiting, diarrhea  Social History: Patient is a smoker.  Patient reports desire to quit.     Objective:   Physical Exam Vitals: Temp 98.1, Pulse 92, BP 134/79 General:  Well appearing. NAD. Neck: supple. No thyromegaly appreciated. Heart: RRR. No murmurs, rubs, or gallops. Lungs:  CTAB. No rales, rhonchi, or wheeze. Abdomen: obese, soft, nontender, nondistended. No organomegaly. Extremities: trace lower edema. No cyanosis or clubbing. Skin: warm, dry, intact.  No rashes noted. Psych: normal mood and affect.  Labs reviewed: TSH 0.277 (8/14).    Assessment:        Plan:

## 2012-05-29 NOTE — Assessment & Plan Note (Signed)
Patient counseled on diet and exercise.  Patient motivated and eager to lose weight.  Patient agreed to increase activity - Walking 30 mins 3x a week.  Patient may benefit from referral to our Nutritionist Dr. Gerilyn Pilgrim.

## 2012-05-29 NOTE — Addendum Note (Signed)
Addended by: Tommie Sams on: 05/29/2012 07:23 PM   Modules accepted: Orders

## 2012-05-29 NOTE — Patient Instructions (Addendum)
It was nice meeting you today.  Continue to take all of your medications as prescribed. I will call you with the results of your thyroid test.  Return for a lab test fasting.  You can come on a wed or Friday or when its convenient.  I will call you with the results of that as well.  Regarding weight loss - try to walk 30 mins a day 3 x a week.  We will discuss more about diet during your next visit.    Smoking cessation.  We will discuss at next visit.  See me back in 3 months.

## 2012-05-29 NOTE — Assessment & Plan Note (Signed)
Currently well controlled. A1C in August, 2013 was 6.1 Patient not currently on medication and is thus diet controlled.  Will continue to monitor.

## 2012-05-29 NOTE — Assessment & Plan Note (Signed)
TSH done today.  Awaiting results. Patient currently on Synthroid 125 mcg daily.  Will adjust medication based on TSH result.

## 2012-05-31 ENCOUNTER — Other Ambulatory Visit: Payer: Self-pay

## 2012-05-31 ENCOUNTER — Other Ambulatory Visit: Payer: Self-pay | Admitting: Family Medicine

## 2012-05-31 DIAGNOSIS — E785 Hyperlipidemia, unspecified: Secondary | ICD-10-CM

## 2012-05-31 LAB — LIPID PANEL
Cholesterol: 169 mg/dL (ref 0–200)
Triglycerides: 154 mg/dL — ABNORMAL HIGH (ref <150)

## 2012-05-31 NOTE — Progress Notes (Signed)
FLP DONE TODAY Gail Cruz 

## 2012-06-03 ENCOUNTER — Telehealth: Payer: Self-pay | Admitting: Family Medicine

## 2012-06-03 ENCOUNTER — Other Ambulatory Visit: Payer: Self-pay | Admitting: Family Medicine

## 2012-06-03 DIAGNOSIS — E039 Hypothyroidism, unspecified: Secondary | ICD-10-CM

## 2012-06-03 MED ORDER — LEVOTHYROXINE SODIUM 100 MCG PO TABS: 100.0000 ug | ORAL_TABLET | Freq: Every day | ORAL | Status: DC

## 2012-06-03 NOTE — Telephone Encounter (Signed)
Spoke with patient regarding lipid panel and TSH.  Changed synthroid to 100 mcg daily and informed patient to return for recheck of TSH/Free T4 in 6-8 weeks. No change in statin therapy at this time (LDL was less than 100)

## 2012-07-24 ENCOUNTER — Ambulatory Visit: Payer: Self-pay | Admitting: Family Medicine

## 2012-07-24 ENCOUNTER — Encounter: Payer: Self-pay | Admitting: Family Medicine

## 2012-07-24 ENCOUNTER — Ambulatory Visit (INDEPENDENT_AMBULATORY_CARE_PROVIDER_SITE_OTHER): Payer: PRIVATE HEALTH INSURANCE | Admitting: Family Medicine

## 2012-07-24 VITALS — BP 132/84 | HR 82 | Temp 98.4°F | Wt 281.0 lb

## 2012-07-24 DIAGNOSIS — E669 Obesity, unspecified: Secondary | ICD-10-CM

## 2012-07-24 DIAGNOSIS — I10 Essential (primary) hypertension: Secondary | ICD-10-CM

## 2012-07-24 DIAGNOSIS — E039 Hypothyroidism, unspecified: Secondary | ICD-10-CM

## 2012-07-24 DIAGNOSIS — E119 Type 2 diabetes mellitus without complications: Secondary | ICD-10-CM

## 2012-07-24 DIAGNOSIS — E785 Hyperlipidemia, unspecified: Secondary | ICD-10-CM

## 2012-07-24 NOTE — Progress Notes (Signed)
Subjective:     Patient ID: Gail Cruz, female   DOB: 01/02/1961, 51 y.o.   MRN: [id number]  HPI [name] is a 51 year old female who presents to the clinic today for follow up regarding Hypothyroidism, Obesity, HTN, and Hyperlipidemia.  1) Hypothyroidism - Patient is currently on Synthroid 100 mcg daily.   - Last TSH was suppressed (see below) - ROS: patient reports weight gain; denies diarrhea, constipation hot/cold intolerance, fatigue  2) HTN - Stable on Lisinopril 40 mg daily - Patient denies any side effects from the medication. - ROS: no lower extremity edema, chest pain, SOB, headache  3) Hyperlipidemia - Stable on Pravastatin 80 mg daily - Last Lipid panel (see below) revealed LDL of 99  4) Obesity - Patient reports desire to lose weight - She reports that her diet is poor but she walks for exercise 2x/week - Expresses desire to see nutritionist about diet/weight loss  Social History: Patient is a smoker.  Review of Systems See HPI    Objective:   Physical Exam  Filed Vitals:   07/24/12 1452  BP: 132/84  Pulse: 82  Temp: 98.4 F (36.9 C)   General: well appearing, NAD. Neck: supple, no thyromegaly. Heart: RRR, no murmurs, rubs, or gallops. Lungs: CTAB. No rales, rhonchi, or wheeze. Abdomen: soft, nontender, nondistended. No organomegaly. Extremities: no cyanosis, clubbing.  Trace lower extremity edema noted. Skin: no rashes or lesions. Psych: normal mood and affect. Neuro: Alert and oriented x 3. no focal deficits.  Lab Results  Component Value Date   TSH 0.188* 05/29/2012   Lipid Panel     Component Value Date/Time   CHOL 169 05/31/2012 0834   TRIG 154* 05/31/2012 0834   HDL 39* 05/31/2012 0834   CHOLHDL 4.3 05/31/2012 0834   VLDL 31 05/31/2012 0834   LDLCALC 99 05/31/2012 0834      Assessment:        Plan:

## 2012-07-24 NOTE — Assessment & Plan Note (Signed)
Gave patient Dr. Larae Grooms (nutrition) card and told her to call to set up an appointment regarding diet and weight loss.

## 2012-07-24 NOTE — Assessment & Plan Note (Signed)
At goal of LDL less than 100. Will continue statin therapy.

## 2012-07-24 NOTE — Patient Instructions (Addendum)
It was nice seeing you today.  I'm checking your TSH (thryoid) today.  I will call you with the results.  I may have to make adjustments to your Synthroid.  Be sure to call Dr. Gerilyn Pilgrim and set up an appointment regarding nutrition and weight loss.

## 2012-07-24 NOTE — Assessment & Plan Note (Signed)
Stable on Lisinopril 40 mg daily.

## 2012-07-24 NOTE — Assessment & Plan Note (Signed)
Will recheck TSH today and titrate Synthroid accordingly.

## 2012-07-25 ENCOUNTER — Other Ambulatory Visit: Payer: Self-pay | Admitting: Family Medicine

## 2012-07-25 DIAGNOSIS — E669 Obesity, unspecified: Secondary | ICD-10-CM

## 2012-08-02 ENCOUNTER — Other Ambulatory Visit: Payer: Self-pay | Admitting: Family Medicine

## 2012-08-26 ENCOUNTER — Encounter: Payer: Self-pay | Admitting: Family Medicine

## 2012-08-26 ENCOUNTER — Ambulatory Visit (INDEPENDENT_AMBULATORY_CARE_PROVIDER_SITE_OTHER): Payer: PRIVATE HEALTH INSURANCE | Admitting: Family Medicine

## 2012-08-26 VITALS — Ht 61.0 in | Wt 284.2 lb

## 2012-08-26 DIAGNOSIS — E119 Type 2 diabetes mellitus without complications: Secondary | ICD-10-CM

## 2012-08-26 DIAGNOSIS — E669 Obesity, unspecified: Secondary | ICD-10-CM

## 2012-08-26 NOTE — Progress Notes (Signed)
Medical Nutrition Therapy:  Appt start time: 1530 end time:  1630.  Assessment:  Primary concerns today: Weight management and Blood sugar control.  Gail Cruz wants to learn to control her eating better.  She was brought up with "country foods," which include fried foods.  She did Weight Watchers and exercised regularly in 2004-5, and lost 100 lb, which she has regained.  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   Usual eating pattern includes 3 meals and 3-4 snacks per day. Usual physical activity includes none other than at work.  She does not like cold weather, so walking outside is challenging, but she has access to an inside TM.    Everyday foods include 24 oz Pepsi, 32-48 oz 2% milk, bottled water, sometimes fruit juice.  Avoided foods include none.   24-hr recall: (Up at 6) B (7 AM)-   16 oz 2% milk, fried egg, Canadian bacon, cheese on Eng muffin Snk ( PM)-   none L (1 PM)-  12 oz Diet Pepsi, bagel thin w/ 1 oz shredded chs and 1 oz [place] Snk (5 PM)-  none D (5 PM)-  1 c pintos, 1 oz shredded cheese, 2 garlic bread sticks, 16 oz 2% milk Snk ( PM)-  none Yesterday was atypical in that Heiress had no snacks.  Other breakfasts might be waffles or Poptarts or cereal (Raisin Bran Crunch or Honey Nut Cheerios). Bobbiejo does not check BG; she discontinued Metformin since A1C dropped below 6, but it was most recently back at 6.1 (Aug 2013).    Progress Towards Goal(s):  In progress.   Nutritional Diagnosis:  NB-2.1 Physical inactivity As related to poor motivation.  As evidenced by no exercise. NI-5.8.3 Inappropriate intake of types of carbohydrates (specify):   As related to beverages.  As evidenced by usual daily intake of 24 oz Pepsi and sometimes juice.    Intervention:  Nutrition education.  Monitoring/Evaluation:  Dietary intake, exercise, and body weight in 4 week(s).

## 2012-08-26 NOTE — Patient Instructions (Addendum)
-   TASTE PREFERENCES ARE LEARNED.  This means that it will get easier to choose foods you know are good for you if you are exposed to them enough.    - Diet Recommendations for both Weight Management and Diabetes   Starchy (carb) foods include: Bread, rice, pasta, potatoes, corn, crackers, bagels, muffins, all baked goods.   - Sweets and sweet beverages count as carb foods as well.    Protein foods include: Meat, fish, poultry, eggs, dairy foods, and beans such as pinto and kidney beans (beans also provide carbohydrate).   1. Eat at least 3 meals and 1-2 snacks per day. Never go more than 4-5 hours while awake without eating.  2. Limit starchy foods to TWO per meal and ONE per snack.   ONE portion of a starchy  food is equal to the following:   - ONE slice of bread (or its equivalent, such as half of a hamburger bun).   - 1/2 cup of a "scoopable" starchy food such as potatoes or rice.   - 1 OUNCE (28 grams) of starchy snack foods such as crackers or pretzels (look on label).   - 15 grams of carbohydrate as shown on food label.   - 4 oz of soda or juice. 3. Both lunch and dinner should include a protein food, a carb food, and vegetables.   - Obtain twice as many veg's as protein or carbohydrate foods for both lunch and dinner.   - Try to keep frozen veg's on hand for a quick vegetable serving.     - Fresh or frozen veg's are best.  4. Breakfast should always include protein.   5. Exercise:  Walk at least 30 min 5 X wk.    - Write down the number of minutes you walk each day on your bedroom calendar. Be prepared to tell Jeannie weekly totals at your follow-up appt.

## 2012-09-23 ENCOUNTER — Ambulatory Visit: Payer: PRIVATE HEALTH INSURANCE | Admitting: Family Medicine

## 2012-09-26 ENCOUNTER — Other Ambulatory Visit: Payer: Self-pay | Admitting: Family Medicine

## 2012-09-27 ENCOUNTER — Other Ambulatory Visit: Payer: Self-pay | Admitting: Family Medicine

## 2012-10-17 ENCOUNTER — Other Ambulatory Visit: Payer: Self-pay | Admitting: Family Medicine

## 2012-10-21 ENCOUNTER — Ambulatory Visit: Payer: PRIVATE HEALTH INSURANCE | Admitting: Family Medicine

## 2012-10-28 ENCOUNTER — Telehealth: Payer: Self-pay | Admitting: Family Medicine

## 2012-10-28 MED ORDER — LOVASTATIN 20 MG PO TABS: 40.0000 mg | ORAL_TABLET | Freq: Every day | ORAL | Status: DC

## 2012-10-28 NOTE — Telephone Encounter (Signed)
Patient is calling because Pravastatin is no longer on the $4 plan at [facility], but Lovastatin is, so she would like to be changed to Lovastin.  Pravastatin is over $80.  She is currently out of her meds.  Please call her to let her know when this has been done.

## 2012-10-28 NOTE — Telephone Encounter (Signed)
Prescription for Lovastatin sent to pharmacy.

## 2012-10-29 NOTE — Telephone Encounter (Signed)
Related message,pt voiced understanding. Gail Cruz  

## 2012-11-08 ENCOUNTER — Ambulatory Visit (INDEPENDENT_AMBULATORY_CARE_PROVIDER_SITE_OTHER): Payer: PRIVATE HEALTH INSURANCE | Admitting: Family Medicine

## 2012-11-08 ENCOUNTER — Other Ambulatory Visit: Payer: Self-pay | Admitting: Family Medicine

## 2012-11-08 ENCOUNTER — Encounter: Payer: Self-pay | Admitting: Family Medicine

## 2012-11-08 VITALS — BP 136/73 | HR 94 | Temp 98.8°F | Ht 60.0 in | Wt 288.4 lb

## 2012-11-08 DIAGNOSIS — I10 Essential (primary) hypertension: Secondary | ICD-10-CM

## 2012-11-08 DIAGNOSIS — M722 Plantar fascial fibromatosis: Secondary | ICD-10-CM

## 2012-11-08 DIAGNOSIS — E785 Hyperlipidemia, unspecified: Secondary | ICD-10-CM

## 2012-11-08 DIAGNOSIS — Z1231 Encounter for screening mammogram for malignant neoplasm of breast: Secondary | ICD-10-CM

## 2012-11-08 DIAGNOSIS — E119 Type 2 diabetes mellitus without complications: Secondary | ICD-10-CM

## 2012-11-08 DIAGNOSIS — Z0000 Encounter for general adult medical examination without abnormal findings: Secondary | ICD-10-CM

## 2012-11-08 NOTE — Assessment & Plan Note (Signed)
Patient in need of colonoscopy this year as well as mammogram. Patient currently waiting on insurance card.  Patient to call and set up mammogram.  Will complete referral to GI for colonoscopy once patient turns 52 (May 2014).

## 2012-11-08 NOTE — Assessment & Plan Note (Signed)
Stable, well controlled via diet. A1C today was 6.2. No need for medications at this time. Will continue to follow closely.

## 2012-11-08 NOTE — Assessment & Plan Note (Signed)
Well controlled. Will continue Lisinopril 40 mg daily.

## 2012-11-08 NOTE — Assessment & Plan Note (Signed)
Patient now on Lovastain 40 mg daily. Per new Lipid guidelines, this is moderate statin therapy. Patient's ASCVD 10 year risk is 11.8% indicating that she should be on high intensity statin.  However, this is not feasible due to medication affordability issues.

## 2012-11-08 NOTE — Progress Notes (Signed)
Subjective:     Patient ID: Gail Cruz, female   DOB: Dec 10, 1960, 52 y.o.   MRN: [id number]  HPI Ms. Hirt present to the clinic today for follow up regarding DM-2, HTN, HLD, and Hypothyroidism.  She is also complaining of R heel pain.  1) DM-2 - Well controlled; Diet controlled. - Patient is not taking any medications at this time. - Needs A1C today (last A1C was 6.1 in 03/2012). ROS: No polyuria, polydipsia, vision changes.  2) HTN - Well controlled on Lisinopril 40 mg daily - No report medication side effects. ROS: No chest pain, SOB, vision changes.  3) HLD - Patient recently switched to lovastatin due to medication affordability issues (pravachol was too expensive) ROS: No reported myalgias.  4) Heel Pain - Right - Has been persistent for ~ 2 weeks.  - Worse first thing in the morning and improves during the day.  No swelling or redness noted.  No ankle joint pain. - Pain is moderate in severity and located directly around the bottom of the heel.  Review of Systems See HPI.    Objective:   Physical Exam  Filed Vitals:   11/08/12 1047  BP: 136/73  Pulse: 94  Temp: 98.8 F (37.1 C)   General: well appearing, NAD. Extremities: Right heel/sole of foot - slightly tender to palpation.  No surrounding erythema or swelling.  ROM slight decreased secondary to pain.  No foot lesions/ulcers/wounds noted.     Assessment:     Plan:

## 2012-11-08 NOTE — Patient Instructions (Addendum)
Use OTC ibuprofen and tylenol for pain. Complete the exercises below daily.  Hold stretches for 5 seconds.  Repeat each exercise 1-3 times. Let me know if your symptoms don't improve. You can also buy a set of heel inserts to aid in pain.   Plantar Fasciitis (Heel Spur Syndrome) with Rehab The plantar fascia is a fibrous, ligament-like, soft-tissue structure that spans the bottom of the foot. Plantar fasciitis is a condition that causes pain in the foot due to inflammation of the tissue. SYMPTOMS   Pain and tenderness on the underneath side of the foot.  Pain that worsens with standing or walking. CAUSES  Plantar fasciitis is caused by irritation and injury to the plantar fascia on the underneath side of the foot. Common mechanisms of injury include:  Direct trauma to bottom of the foot.  Damage to a small nerve that runs under the foot where the main fascia attaches to the heel bone.  Stress placed on the plantar fascia due to bone spurs. RISK INCREASES WITH:   Activities that place stress on the plantar fascia (running, jumping, pivoting, or cutting).  Poor strength and flexibility.  Improperly fitted shoes.  Tight calf muscles.  Flat feet.  Failure to warm-up properly before activity.  Obesity. PREVENTION  Warm up and stretch properly before activity.  Allow for adequate recovery between workouts.  Maintain physical fitness:  Strength, flexibility, and endurance.  Cardiovascular fitness.  Maintain a health body weight.  Avoid stress on the plantar fascia.  Wear properly fitted shoes, including arch supports for individuals who have flat feet. PROGNOSIS  If treated properly, then the symptoms of plantar fasciitis usually resolve without surgery. However, occasionally surgery is necessary. RELATED COMPLICATIONS   Recurrent symptoms that may result in a chronic condition.  Problems of the lower back that are caused by compensating for the injury, such as  limping.  Pain or weakness of the foot during push-off following surgery.  Chronic inflammation, scarring, and partial or complete fascia tear, occurring more often from repeated injections. TREATMENT  Treatment initially involves the use of ice and medication to help reduce pain and inflammation. The use of strengthening and stretching exercises may help reduce pain with activity, especially stretches of the Achilles tendon. These exercises may be performed at home or with a therapist. Your caregiver may recommend that you use heel cups of arch supports to help reduce stress on the plantar fascia. Occasionally, corticosteroid injections are given to reduce inflammation. If symptoms persist for greater than 6 months despite non-surgical (conservative), then surgery may be recommended.  MEDICATION   If pain medication is necessary, then nonsteroidal anti-inflammatory medications, such as aspirin and ibuprofen, or other minor pain relievers, such as acetaminophen, are often recommended.  Do not take pain medication within 7 days before surgery.  Prescription pain relievers may be given if deemed necessary by your caregiver. Use only as directed and only as much as you need.  Corticosteroid injections may be given by your caregiver. These injections should be reserved for the most serious cases, because they may only be given a certain number of times. HEAT AND COLD  Cold treatment (icing) relieves pain and reduces inflammation. Cold treatment should be applied for 10 to 15 minutes every 2 to 3 hours for inflammation and pain and immediately after any activity that aggravates your symptoms. Use ice packs or massage the area with a piece of ice (ice massage).  Heat treatment may be used prior to performing the stretching and  strengthening activities prescribed by your caregiver, physical therapist, or athletic trainer. Use a heat pack or soak the injury in warm water. SEEK IMMEDIATE MEDICAL CARE  IF:  Treatment seems to offer no benefit, or the condition worsens.  Any medications produce adverse side effects. EXERCISES RANGE OF MOTION (ROM) AND STRETCHING EXERCISES - Plantar Fasciitis (Heel Spur Syndrome) These exercises may help you when beginning to rehabilitate your injury. Your symptoms may resolve with or without further involvement from your physician, physical therapist or athletic trainer. While completing these exercises, remember:   Restoring tissue flexibility helps normal motion to return to the joints. This allows healthier, less painful movement and activity.  A stretch should never be painful. You should only feel a gentle lengthening or release in the stretched tissue. RANGE OF MOTION - Toe Extension, Flexion  Sit with your right / left leg crossed over your opposite knee.  Grasp your toes and gently pull them back toward the top of your foot. You should feel a stretch on the bottom of your toes and/or foot.  Hold this stretch for __________ seconds.  Now, gently pull your toes toward the bottom of your foot. You should feel a stretch on the top of your toes and or foot.  Hold this stretch for __________ seconds. Repeat __________ times. Complete this stretch __________ times per day.  RANGE OF MOTION - Ankle Dorsiflexion, Active Assisted  Remove shoes and sit on a chair that is preferably not on a carpeted surface.  Place right / left foot under knee. Extend your opposite leg for support.  Keeping your heel down, slide your right / left foot back toward the chair until you feel a stretch at your ankle or calf. If you do not feel a stretch, slide your bottom forward to the edge of the chair, while still keeping your heel down.  Hold this stretch for __________ seconds. Repeat __________ times. Complete this stretch __________ times per day.  STRETCH  Gastroc, Standing  Place hands on wall.  Extend right / left leg, keeping the front knee somewhat  bent.  Slightly point your toes inward on your back foot.  Keeping your right / left heel on the floor and your knee straight, shift your weight toward the wall, not allowing your back to arch.  You should feel a gentle stretch in the right / left calf. Hold this position for __________ seconds. Repeat __________ times. Complete this stretch __________ times per day. STRETCH  Soleus, Standing  Place hands on wall.  Extend right / left leg, keeping the other knee somewhat bent.  Slightly point your toes inward on your back foot.  Keep your right / left heel on the floor, bend your back knee, and slightly shift your weight over the back leg so that you feel a gentle stretch deep in your back calf.  Hold this position for __________ seconds. Repeat __________ times. Complete this stretch __________ times per day. STRETCH  Gastrocsoleus, Standing  Note: This exercise can place a lot of stress on your foot and ankle. Please complete this exercise only if specifically instructed by your caregiver.   Place the ball of your right / left foot on a step, keeping your other foot firmly on the same step.  Hold on to the wall or a rail for balance.  Slowly lift your other foot, allowing your body weight to press your heel down over the edge of the step.  You should feel a stretch in your  right / left calf.  Hold this position for __________ seconds.  Repeat this exercise with a slight bend in your right / left knee. Repeat __________ times. Complete this stretch __________ times per day.  STRENGTHENING EXERCISES - Plantar Fasciitis (Heel Spur Syndrome)  These exercises may help you when beginning to rehabilitate your injury. They may resolve your symptoms with or without further involvement from your physician, physical therapist or athletic trainer. While completing these exercises, remember:   Muscles can gain both the endurance and the strength needed for everyday activities through  controlled exercises.  Complete these exercises as instructed by your physician, physical therapist or athletic trainer. Progress the resistance and repetitions only as guided. STRENGTH - Towel Curls  Sit in a chair positioned on a non-carpeted surface.  Place your foot on a towel, keeping your heel on the floor.  Pull the towel toward your heel by only curling your toes. Keep your heel on the floor.  If instructed by your physician, physical therapist or athletic trainer, add ____________________ at the end of the towel. Repeat __________ times. Complete this exercise __________ times per day. STRENGTH - Ankle Inversion  Secure one end of a rubber exercise band/tubing to a fixed object (table, pole). Loop the other end around your foot just before your toes.  Place your fists between your knees. This will focus your strengthening at your ankle.  Slowly, pull your big toe up and in, making sure the band/tubing is positioned to resist the entire motion.  Hold this position for __________ seconds.  Have your muscles resist the band/tubing as it slowly pulls your foot back to the starting position. Repeat __________ times. Complete this exercises __________ times per day.  Document Released: 07/24/2005 Document Revised: 10/16/2011 Document Reviewed: 11/05/2008 [facility] Patient Information 2013 [place], [place].

## 2012-11-08 NOTE — Assessment & Plan Note (Signed)
Patient given handout and exercises. Patient encouraged to use heel inserts and tylenol/ibuprofen PRN for pain.  She was instructed to return if her symptoms do not improve or worsen.

## 2012-11-19 ENCOUNTER — Other Ambulatory Visit: Payer: Self-pay | Admitting: Family Medicine

## 2012-11-25 ENCOUNTER — Ambulatory Visit (HOSPITAL_COMMUNITY): Payer: PRIVATE HEALTH INSURANCE

## 2012-11-27 ENCOUNTER — Ambulatory Visit (HOSPITAL_COMMUNITY)
Admission: RE | Admit: 2012-11-27 | Discharge: 2012-11-27 | Disposition: A | Discharge status: Home / Self Care | Payer: BC Managed Care – PPO | Source: Ambulatory Visit | Attending: Family Medicine | Admitting: Family Medicine

## 2012-11-27 DIAGNOSIS — Z1231 Encounter for screening mammogram for malignant neoplasm of breast: Secondary | ICD-10-CM | POA: Insufficient documentation

## 2012-12-02 ENCOUNTER — Encounter: Payer: Self-pay | Admitting: Family Medicine

## 2012-12-26 ENCOUNTER — Telehealth: Payer: Self-pay | Admitting: Family Medicine

## 2012-12-26 DIAGNOSIS — Z1211 Encounter for screening for malignant neoplasm of colon: Secondary | ICD-10-CM

## 2012-12-26 NOTE — Telephone Encounter (Signed)
Pt was told when she turned 52 for her to call to get [place] to set up her 1st colonoscopy - pls advise

## 2012-12-26 NOTE — Telephone Encounter (Signed)
Will fwd to MD for advice and referral if needed.  Lenaya Pietsch, Darlyne Russian, CMA

## 2012-12-26 NOTE — Telephone Encounter (Signed)
Referral placed for GI for screening colonoscopy

## 2012-12-27 ENCOUNTER — Encounter: Payer: Self-pay | Admitting: Gastroenterology

## 2013-02-05 ENCOUNTER — Encounter: Payer: Self-pay | Admitting: Gastroenterology

## 2013-02-05 ENCOUNTER — Ambulatory Visit (AMBULATORY_SURGERY_CENTER): Payer: BC Managed Care – PPO | Admitting: *Deleted

## 2013-02-05 ENCOUNTER — Telehealth: Payer: Self-pay | Admitting: *Deleted

## 2013-02-05 VITALS — Ht 61.75 in | Wt 290.0 lb

## 2013-02-05 DIAGNOSIS — Z1211 Encounter for screening for malignant neoplasm of colon: Secondary | ICD-10-CM

## 2013-02-05 MED ORDER — NA SULFATE-K SULFATE-MG SULF 17.5-3.13-1.6 GM/177ML PO SOLN: 1.0000 | Freq: Once | ORAL | Status: DC

## 2013-02-05 NOTE — Telephone Encounter (Signed)
This patient came in 02-05-13 for a Pre visit with a BMI of 53.47. Her weight is 290 lb and her height is 5 1.75. Patients father had colon cancer and she has not had a colonoscopy. Can this patient be a direct hospital case or does she need an office visit prior to scheduling a colonoscopy?  I did complete her pre visit today, went over all her instructions for prep etc she will just need updated instructions once her procedure is rescheduled. I have also sent her prep to her pharmacy.   Pt did request her procedure be scheduled on a Friday if possible and her next choice is a Wednesday due to her job. She also would like to be called on her cell phone at [phone] on Monday , Tuesday  thursdays for the reschedule date or if its a wed or Friday please call her home number at [phone]. Thanks for your time.   Marie pre visit

## 2013-02-05 NOTE — Telephone Encounter (Signed)
Dr. Arlyce Dice can this pt be a direct colon at the hospital or do you need to see her for an OV first? Please advise. See note below.

## 2013-02-05 NOTE — Progress Notes (Signed)
No egg or soy allergy. ewm No home 02 use. ewm No problems with past sedation. ewm pts BMI 53.47 with a height of 5 1.75 and a weight of 290.0 lbs. Pt has to be changed to hospital case. Pt aware. Dr Arlyce Dice sent TE to advise. ewm

## 2013-02-09 NOTE — Telephone Encounter (Signed)
OK for direct colonoscopy at hospital

## 2013-02-11 ENCOUNTER — Other Ambulatory Visit: Payer: Self-pay | Admitting: Gastroenterology

## 2013-02-11 DIAGNOSIS — Z1211 Encounter for screening for malignant neoplasm of colon: Secondary | ICD-10-CM

## 2013-02-11 NOTE — Telephone Encounter (Signed)
Pt scheduled for colon at [facility] 02/21/13@9 :15am. Pt to arrive there at 8am. New prep instructions mailed to the pt.Pt aware of appt date and time.

## 2013-02-14 ENCOUNTER — Encounter: Payer: BC Managed Care – PPO | Admitting: Gastroenterology

## 2013-02-21 ENCOUNTER — Encounter (HOSPITAL_COMMUNITY)
Admission: RE | Disposition: A | Discharge status: Home / Self Care | Payer: Self-pay | Source: Ambulatory Visit | Attending: Gastroenterology

## 2013-02-21 ENCOUNTER — Ambulatory Visit (HOSPITAL_COMMUNITY)
Admission: RE | Admit: 2013-02-21 | Discharge: 2013-02-21 | Disposition: A | Discharge status: Home / Self Care | Payer: BC Managed Care – PPO | Source: Ambulatory Visit | Attending: Gastroenterology | Admitting: Gastroenterology

## 2013-02-21 ENCOUNTER — Encounter (HOSPITAL_COMMUNITY): Payer: Self-pay | Admitting: *Deleted

## 2013-02-21 DIAGNOSIS — E119 Type 2 diabetes mellitus without complications: Secondary | ICD-10-CM | POA: Insufficient documentation

## 2013-02-21 DIAGNOSIS — E785 Hyperlipidemia, unspecified: Secondary | ICD-10-CM | POA: Insufficient documentation

## 2013-02-21 DIAGNOSIS — E039 Hypothyroidism, unspecified: Secondary | ICD-10-CM | POA: Insufficient documentation

## 2013-02-21 DIAGNOSIS — Z1211 Encounter for screening for malignant neoplasm of colon: Secondary | ICD-10-CM

## 2013-02-21 DIAGNOSIS — E669 Obesity, unspecified: Secondary | ICD-10-CM | POA: Insufficient documentation

## 2013-02-21 DIAGNOSIS — I10 Essential (primary) hypertension: Secondary | ICD-10-CM | POA: Insufficient documentation

## 2013-02-21 DIAGNOSIS — Z800 Family history of malignant neoplasm of digestive organs: Secondary | ICD-10-CM

## 2013-02-21 DIAGNOSIS — F17200 Nicotine dependence, unspecified, uncomplicated: Secondary | ICD-10-CM | POA: Insufficient documentation

## 2013-02-21 HISTORY — DX: Headache: R51

## 2013-02-21 HISTORY — PX: COLONOSCOPY: SHX5424

## 2013-02-21 HISTORY — DX: Hypothyroidism, unspecified: E03.9

## 2013-02-21 LAB — GLUCOSE, CAPILLARY: Glucose-Capillary: 115 mg/dL — ABNORMAL HIGH (ref 70–99)

## 2013-02-21 SURGERY — COLONOSCOPY
Anesthesia: Moderate Sedation

## 2013-02-21 MED ORDER — DIPHENHYDRAMINE HCL 50 MG/ML IJ SOLN
INTRAMUSCULAR | Status: AC
  Filled 2013-02-21: qty 1

## 2013-02-21 MED ORDER — FENTANYL CITRATE 0.05 MG/ML IJ SOLN
INTRAMUSCULAR | Status: AC
  Filled 2013-02-21: qty 4

## 2013-02-21 MED ORDER — FENTANYL CITRATE 0.05 MG/ML IJ SOLN
INTRAMUSCULAR | Status: DC | PRN
  Administered 2013-02-21 (×4): 25 ug via INTRAVENOUS

## 2013-02-21 MED ORDER — MIDAZOLAM HCL 5 MG/5ML IJ SOLN
INTRAMUSCULAR | Status: DC | PRN
  Administered 2013-02-21 (×4): 2 mg via INTRAVENOUS

## 2013-02-21 MED ORDER — DIPHENHYDRAMINE HCL 50 MG/ML IJ SOLN
INTRAMUSCULAR | Status: DC | PRN
  Administered 2013-02-21: 25 mg via INTRAVENOUS

## 2013-02-21 MED ORDER — MIDAZOLAM HCL 10 MG/2ML IJ SOLN
INTRAMUSCULAR | Status: AC
  Filled 2013-02-21: qty 4

## 2013-02-21 MED ORDER — SODIUM CHLORIDE 0.9 % IV SOLN
INTRAVENOUS | Status: DC
  Administered 2013-02-21: 09:00:00 via INTRAVENOUS
  Administered 2013-02-21: 500 mL via INTRAVENOUS

## 2013-02-21 NOTE — Op Note (Signed)
[facility] [address] [place], [address]   COLONOSCOPY PROCEDURE REPORT  PATIENT: Gail Cruz, Gail Cruz  MR#: [id number] BIRTHDATE: 1960-11-29 , 52  yrs. old GENDER: Female ENDOSCOPIST: Louis Meckel, MD REFERRED BY: PROCEDURE DATE:  02/21/2013 PROCEDURE:   Colonoscopy, diagnostic ASA CLASS:   Class II INDICATIONS:Patient's immediate family history of colon cancer. MEDICATIONS: These medications were titrated to patient response per physician's verbal order, Versed 8 mg IV, Fentanyl 100 mcg IV, and Benadryl 25 mg IV  DESCRIPTION OF PROCEDURE:   After the risks benefits and alternatives of the procedure were thoroughly explained, informed consent was obtained.  A digital rectal exam revealed no abnormalities of the rectum.   The Pentax Adult Colonscope [id number] endoscope was introduced through the anus and advanced to the cecum, which was identified by both the appendix and ileocecal valve. No adverse events experienced.   The quality of the prep was Suprep good  The instrument was then slowly withdrawn as the colon was fully examined.      COLON FINDINGS: A normal appearing cecum, ileocecal valve, and appendiceal orifice were identified.  The ascending, hepatic flexure, transverse, splenic flexure, descending, sigmoid colon and rectum appeared unremarkable.  No polyps or cancers were seen. Retroflexed views revealed no abnormalities. The time to cecum=  . Withdrawal time=8 minutes 0 seconds.  The scope was withdrawn and the procedure completed. COMPLICATIONS: There were no complications.  ENDOSCOPIC IMPRESSION: Normal colon  RECOMMENDATIONS: Given your significant family history of colon cancer, you should have a repeat colonoscopy in 5 years   eSigned:  Louis Meckel, MD 02/21/2013 11:01 AM   cc: Everlene Other, D.O.

## 2013-02-21 NOTE — H&P (Signed)
  History of Present Illness: 52 year old white female with hypertension, diabetes and obesity here for screening colonoscopy. She has no GI complaints including change of bowel habits, abdominal pain, melena or hematochezia. Family history is pertinent for father with colon cancer at 13.    Past Medical History  Diagnosis Date  . Hypertension   . Hyperlipidemia   . Diabetes mellitus without complication     diet controlled- no medicines  . Hypothyroidism   . RUEAVWUJ(811.9)    Past Surgical History  Procedure Laterality Date  . Cesarean section  1991    at [facility]  . Cholecystectomy     family history includes Breast cancer (age of onset: 55) in her mother and Colon cancer in her father. Current Facility-Administered Medications  Medication Dose Route Frequency Provider Last Rate Last Dose  . 0.9 %  sodium chloride infusion   Intravenous Continuous Louis Meckel, MD 20 mL/hr at 02/21/13 1478     Allergies as of 02/11/2013 - Review Complete 02/05/2013  Allergen Reaction Noted  . Ciprofloxacin  06/22/2010    reports that she has been smoking Cigarettes.  She has been smoking about 0.00 packs per day. She has never used smokeless tobacco. She reports that she does not drink alcohol or use illicit drugs.     Review of Systems: Pertinent positive and negative review of systems were noted in the above HPI section. All other review of systems were otherwise negative.  Vital signs were reviewed in today's medical record Physical Exam: General: Well developed , well nourished, no acute distress Skin: anicteric Head: Normocephalic and atraumatic Eyes:  sclerae anicteric, EOMI Ears: Normal auditory acuity Mouth: No deformity or lesions Neck: Supple, no masses or thyromegaly Lungs: Clear throughout to auscultation Heart: Regular rate and rhythm; no murmurs, rubs or bruits Abdomen: Soft, non tender and non distended. No masses, hepatosplenomegaly or hernias noted. Normal Bowel  sounds Rectal:deferred Musculoskeletal: Symmetrical with no gross deformities  Skin: No lesions on visible extremities Pulses:  Normal pulses noted Extremities: No clubbing, cyanosis, edema or deformities noted Neurological: Alert oriented x 4, grossly nonfocal Cervical Nodes:  No significant cervical adenopathy Inguinal Nodes: No significant inguinal adenopathy Psychological:  Alert and cooperative. Normal mood and affect  Impression-family history of colon cancer  Plan-screening colonoscopy

## 2013-02-24 ENCOUNTER — Encounter (HOSPITAL_COMMUNITY): Payer: Self-pay | Admitting: Gastroenterology

## 2013-03-12 ENCOUNTER — Encounter: Payer: Self-pay | Admitting: Family Medicine

## 2013-03-12 ENCOUNTER — Ambulatory Visit (INDEPENDENT_AMBULATORY_CARE_PROVIDER_SITE_OTHER): Payer: BC Managed Care – PPO | Admitting: Family Medicine

## 2013-03-12 ENCOUNTER — Ambulatory Visit: Payer: BC Managed Care – PPO | Admitting: Family Medicine

## 2013-03-12 VITALS — BP 131/83 | HR 79 | Temp 98.8°F | Ht 61.75 in | Wt 284.0 lb

## 2013-03-12 DIAGNOSIS — L600 Ingrowing nail: Secondary | ICD-10-CM | POA: Insufficient documentation

## 2013-03-12 MED ORDER — CEPHALEXIN 500 MG PO CAPS: 500.0000 mg | ORAL_CAPSULE | Freq: Three times a day (TID) | ORAL | Status: DC

## 2013-03-12 NOTE — Assessment & Plan Note (Signed)
-   Started patient on keflex 500 mg TID for 7 days and had her scheduled for procedure time next week with available resident - Patient desires partial removal of her nailbed.  - red flags given for her to return to clinic

## 2013-03-12 NOTE — Patient Instructions (Signed)
Please take antibiotics as prescribed. Make an appointment on your way out today for a procedure appt to have your toenail removed.   Ingrown Toenail An ingrown toenail occurs when the sharp edge of your toenail grows into the skin. Causes of ingrown toenails include toenails clipped too far back or poorly fitting shoes. Activities involving sudden stops (basketball, tennis) causing "toe jamming" may lead to an ingrown nail. HOME CARE INSTRUCTIONS   Soak the whole foot in warm soapy water for 20 minutes, 3 times per day.  You may lift the edge of the nail away from the sore skin by wedging a small piece of cotton under the corner of the nail. Be careful not to dig (traumatize) and cause more injury to the area.  Wear shoes that fit well. While the ingrown nail is causing problems, sandals may be beneficial.  Trim your toenails regularly and carefully. Cut your toenails straight across, not in a curve. This will prevent injury to the skin at the corners of the toenail.  Keep your feet clean and dry.  Crutches may be helpful early in treatment if walking is painful.  Antibiotics, if prescribed, should be taken as directed.  Return for a wound check in 2 days or as directed.  Only take over-the-counter or prescription medicines for pain, discomfort, or fever as directed by your caregiver. SEEK IMMEDIATE MEDICAL CARE IF:   You have a fever.  You have increasing pain, redness, swelling, or heat at the wound site.  Your toe is not better in 7 days. If conservative treatment is not successful, surgical removal of a portion or all of the nail may be necessary. MAKE SURE YOU:   Understand these instructions.  Will watch your condition.  Will get help right away if you are not doing well or get worse. Document Released: 07/21/2000 Document Revised: 10/16/2011 Document Reviewed: 07/15/2008 [facility] Patient Information 2014 [place], [place].

## 2013-03-12 NOTE — Progress Notes (Signed)
Subjective:     Patient ID: Gail Cruz, female   DOB: 21-May-1961, 52 y.o.   MRN: [id number]  HPI Left toenail pain: Patient with a history of diabetes (resolved with diet) and frequent leg infections requiring hospitalizations and IV medications (last 2011) presented as a same day for left great toe nail pain that started last week. She noticed last Thursday on it has become more painful causing her to have to take shoes and socks off due to pain. She does not recall any trauma to cause her pain. She has never has an in grown toe nail before. This past weekend she took nail clippers and a fingernail file to her toe nail in attempts to "dig" it out. She has not noticed any drainage, denies fever. She has noticed the past two days redness as developed around her nail proximally and laterally. It has become more tender as well.   Review of Systems Negative, with the exception of above mentioned in HPI     Objective:   Physical Exam BP 131/83  Pulse 79  Temp(Src) 98.8 F (37.1 C) (Oral)  Ht 5' 1.75" (1.568 m)  Wt 284 lb (128.822 kg)  BMI 52.4 kg/m2 Gen: Pleasant, talkative obese female.  CV: RRR EXT: Areas on bilateral calves with mild erythema she states is residual scar from her infections she was hospitalized for in the past. No calf tenderness. Trace edema. +2/4 bilateral PT pulses. Feet well perfused. Left great toe with erythema and swelling eponychium and bilateral perionychium. No drainage noted. TTP. Medial nail imbedded into perionychium.

## 2013-03-24 ENCOUNTER — Other Ambulatory Visit: Payer: Self-pay | Admitting: *Deleted

## 2013-03-24 MED ORDER — LISINOPRIL 40 MG PO TABS: 40.0000 mg | ORAL_TABLET | Freq: Every day | ORAL | Status: DC

## 2013-03-28 ENCOUNTER — Ambulatory Visit (INDEPENDENT_AMBULATORY_CARE_PROVIDER_SITE_OTHER): Payer: BC Managed Care – PPO | Admitting: Family Medicine

## 2013-03-28 DIAGNOSIS — L600 Ingrowing nail: Secondary | ICD-10-CM

## 2013-03-28 NOTE — Progress Notes (Signed)
Subjective:     Patient ID: Gail Cruz, female   DOB: 09-04-1960, 52 y.o.   MRN: [id number]  HPI 52 year old female presents for follow up regarding recent ingrown toenail.  1) Ingrown toenail - Patient recent seen in the [facility] clinic on 8/6 and diagnosed with ingrown toe nail - She was treated with Keflex and supportive care - Patient presents today for ? Removal of nail bed. - Patient reports that following antibiotics her pain resolved.  She denies any pain currently.  No reported fevers, chills, difficulty with ambulation, drainage from the area.  Review of Systems Per HPI    Objective:   Physical Exam General: well appearing female, NAD. Extremities: Left big toe - mild erythema of medial portion of tissue surrounding the nailbed. No obvious infection or drainage.  No evidence of ingrown toenail at this time.     Assessment:     See problem list    Plan:

## 2013-03-28 NOTE — Patient Instructions (Addendum)
It was nice seeing you today.   Use good nail hygiene as discussed.   Ingrown Toenail An ingrown toenail occurs when the sharp edge of your toenail grows into the skin. Causes of ingrown toenails include toenails clipped too far back or poorly fitting shoes. Activities involving sudden stops (basketball, tennis) causing "toe jamming" may lead to an ingrown nail. HOME CARE INSTRUCTIONS   Soak the whole foot in warm soapy water for 20 minutes, 3 times per day.  You may lift the edge of the nail away from the sore skin by wedging a small piece of cotton under the corner of the nail. Be careful not to dig (traumatize) and cause more injury to the area.  Wear shoes that fit well. While the ingrown nail is causing problems, sandals may be beneficial.  Trim your toenails regularly and carefully. Cut your toenails straight across, not in a curve. This will prevent injury to the skin at the corners of the toenail.  Keep your feet clean and dry.  Crutches may be helpful early in treatment if walking is painful.  Antibiotics, if prescribed, should be taken as directed.  Return for a wound check in 2 days or as directed.  Only take over-the-counter or prescription medicines for pain, discomfort, or fever as directed by your caregiver. SEEK IMMEDIATE MEDICAL CARE IF:   You have a fever.  You have increasing pain, redness, swelling, or heat at the wound site.  Your toe is not better in 7 days. If conservative treatment is not successful, surgical removal of a portion or all of the nail may be necessary. MAKE SURE YOU:   Understand these instructions.  Will watch your condition.  Will get help right away if you are not doing well or get worse. Document Released: 07/21/2000 Document Revised: 10/16/2011 Document Reviewed: 07/15/2008 [facility] Patient Information 2014 [place], [place].

## 2013-03-28 NOTE — Assessment & Plan Note (Signed)
Appears to be resolved. Advised good nail care.   Patient agreeable and is please that she doesn't have to her her nail removed.

## 2013-07-25 ENCOUNTER — Telehealth: Payer: Self-pay | Admitting: Family Medicine

## 2013-07-25 NOTE — Telephone Encounter (Signed)
Pt called because she needs a refill on her thyroid medication sent in. jw

## 2013-07-27 MED ORDER — LEVOTHYROXINE SODIUM 100 MCG PO TABS: ORAL_TABLET | ORAL | Status: DC

## 2013-07-27 NOTE — Telephone Encounter (Signed)
Rx sent 

## 2013-10-22 ENCOUNTER — Other Ambulatory Visit: Payer: Self-pay | Admitting: Family Medicine

## 2013-10-29 ENCOUNTER — Encounter: Payer: Self-pay | Admitting: Family Medicine

## 2013-10-29 ENCOUNTER — Telehealth: Payer: Self-pay | Admitting: *Deleted

## 2013-10-29 ENCOUNTER — Ambulatory Visit (INDEPENDENT_AMBULATORY_CARE_PROVIDER_SITE_OTHER): Payer: BC Managed Care – PPO | Admitting: Family Medicine

## 2013-10-29 VITALS — BP 145/78 | HR 85 | Temp 98.1°F | Ht 61.75 in | Wt 303.1 lb

## 2013-10-29 DIAGNOSIS — Z6841 Body Mass Index (BMI) 40.0 and over, adult: Secondary | ICD-10-CM

## 2013-10-29 DIAGNOSIS — E785 Hyperlipidemia, unspecified: Secondary | ICD-10-CM

## 2013-10-29 DIAGNOSIS — F17200 Nicotine dependence, unspecified, uncomplicated: Secondary | ICD-10-CM

## 2013-10-29 DIAGNOSIS — E039 Hypothyroidism, unspecified: Secondary | ICD-10-CM

## 2013-10-29 DIAGNOSIS — E119 Type 2 diabetes mellitus without complications: Secondary | ICD-10-CM

## 2013-10-29 DIAGNOSIS — I10 Essential (primary) hypertension: Secondary | ICD-10-CM

## 2013-10-29 DIAGNOSIS — I872 Venous insufficiency (chronic) (peripheral): Secondary | ICD-10-CM

## 2013-10-29 DIAGNOSIS — Z0000 Encounter for general adult medical examination without abnormal findings: Secondary | ICD-10-CM

## 2013-10-29 LAB — LIPID PANEL
Cholesterol: 172 mg/dL (ref 0–200)
HDL: 43 mg/dL (ref >39)
LDL CALC: 99 mg/dL (ref 0–99)
Total CHOL/HDL Ratio: 4 Ratio
Triglycerides: 151 mg/dL — ABNORMAL HIGH (ref <150)
VLDL: 30 mg/dL (ref 0–40)

## 2013-10-29 LAB — COMPLETE METABOLIC PANEL WITH GFR
ALBUMIN: 3.7 g/dL (ref 3.5–5.2)
ALK PHOS: 74 U/L (ref 39–117)
ALT: 19 U/L (ref 0–35)
AST: 16 U/L (ref 0–37)
BUN: 12 mg/dL (ref 6–23)
CALCIUM: 8.9 mg/dL (ref 8.4–10.5)
CHLORIDE: 105 meq/L (ref 96–112)
CO2: 26 meq/L (ref 19–32)
Creat: 0.69 mg/dL (ref 0.50–1.10)
GFR, Est African American: >89 mL/min
GLUCOSE: 109 mg/dL — AB (ref 70–99)
POTASSIUM: 4.3 meq/L (ref 3.5–5.3)
SODIUM: 140 meq/L (ref 135–145)
TOTAL PROTEIN: 6.5 g/dL (ref 6.0–8.3)
Total Bilirubin: 0.6 mg/dL (ref 0.2–1.2)

## 2013-10-29 LAB — CBC
HEMATOCRIT: 39.4 % (ref 36.0–46.0)
Hemoglobin: 13.1 g/dL (ref 12.0–15.0)
MCH: 27.7 pg (ref 26.0–34.0)
MCHC: 33.2 g/dL (ref 30.0–36.0)
MCV: 83.3 fL (ref 78.0–100.0)
PLATELETS: 228 10*3/uL (ref 150–400)
RBC: 4.73 MIL/uL (ref 3.87–5.11)
RDW: 14.5 % (ref 11.5–15.5)
WBC: 7.3 10*3/uL (ref 4.0–10.5)

## 2013-10-29 LAB — POCT GLYCOSYLATED HEMOGLOBIN (HGB A1C): Hemoglobin A1C: 6.2

## 2013-10-29 NOTE — Assessment & Plan Note (Signed)
TSH today. Will adjust synthroid if needed based on result.

## 2013-10-29 NOTE — Assessment & Plan Note (Signed)
A1C 6.2 today. Diet controlled.  Will continue to monitor closely.

## 2013-10-29 NOTE — Progress Notes (Signed)
   Subjective:    Patient ID: Gail Cruz, female    DOB: 02-Apr-1961, 53 y.o.   MRN: [id number]  HPI 53 year old female presents for annual follow up today.  1) HTN Disease Monitoring: Home BP Monitoring - No   Medications: Lisinopril 40 mg daily Compliance -  Yes. ROS: Denies chest pain, SOB, lightheadedness/dizziness   2) Diabetes mellitus, type 2 - Patient is diet controlled - She does not take any medications and does not check CBG's - ROS: denies numbness/tingling in extremities, lightheadedness/dizziness, polyuria, polydipsia  3) HLD - Current on Lovastatin. - Endorses compliance. - No reports of muscle weakness or fatigue.  4) Hypothyroidism - Doing well - No reports of hot/cold intolerance, hair loss, increasing fatigue - Compliant with Synthroid  5) Morbid obesity - Patient interested in discussing referral to bariatric surgery for gastric bypass  Social Hx reviewed - Patient is a smoker (2-3 cigarettes daily).  Review of Systems Per HPI    Objective:   Physical Exam Filed Vitals:   10/29/13 0836  BP: 145/78  Pulse: 85  Temp: 98.1 F (36.7 C)   Exam: General: well appearing obese female in NAD.  Pleasant, conversant. Psych: normal mood and affect.    Assessment & Plan:  See Problem List

## 2013-10-29 NOTE — Patient Instructions (Addendum)
It was nice to see you today.  We will be in contact regarding your labs.  We will also be in contact regarding your referral to Bariatric surgery.  Follow up with me later this year for a pap smear.  Below is a list of your medications and medical conditions:  Hypothyroidism Hypertension Diabetes mellitus type II, controlled Hyperlipidemia Tobacco abuse Morbid obesity  Lovastatin Lisinopril Synthroid

## 2013-10-29 NOTE — Assessment & Plan Note (Signed)
Patient in need of mammogram. Patient has received letter regarding need.  She is going to set this up soon.

## 2013-10-29 NOTE — Assessment & Plan Note (Signed)
Patient interested in weight loss surgery. Patient to be given info regarding bariatric surgery options in the area.

## 2013-10-29 NOTE — Telephone Encounter (Signed)
LVM for patient to call back. I am placing information on the bariatric surgery up front for her. This is not a referral, she will need to call numbers her self and go from there.

## 2013-10-29 NOTE — Assessment & Plan Note (Signed)
At goal. Continuing ACEI. Obtaining CMP today.

## 2013-10-29 NOTE — Assessment & Plan Note (Signed)
Obtaining Lipid panel today. ASCVD score is 13.8. Patient tolerating Lovastatin.  Based on above score will discuss switch to high potency statin.

## 2013-10-30 LAB — TSH: TSH: 2.979 u[IU]/mL (ref 0.350–4.500)

## 2013-11-03 ENCOUNTER — Encounter: Payer: Self-pay | Admitting: Family Medicine

## 2013-11-27 ENCOUNTER — Other Ambulatory Visit: Payer: Self-pay | Admitting: Family Medicine

## 2013-11-28 ENCOUNTER — Other Ambulatory Visit: Payer: Self-pay | Admitting: Family Medicine

## 2013-11-28 ENCOUNTER — Ambulatory Visit (HOSPITAL_COMMUNITY)
Admission: RE | Admit: 2013-11-28 | Discharge: 2013-11-28 | Disposition: A | Discharge status: Home / Self Care | Payer: BC Managed Care – PPO | Source: Ambulatory Visit | Attending: Internal Medicine | Admitting: Internal Medicine

## 2013-11-28 DIAGNOSIS — Z1231 Encounter for screening mammogram for malignant neoplasm of breast: Secondary | ICD-10-CM

## 2013-12-03 ENCOUNTER — Encounter: Payer: Self-pay | Admitting: Family Medicine

## 2013-12-09 ENCOUNTER — Telehealth (INDEPENDENT_AMBULATORY_CARE_PROVIDER_SITE_OTHER): Payer: Self-pay

## 2013-12-09 NOTE — Telephone Encounter (Signed)
Called and left message for patient regarding upcoming appointment need's to be rescheduled.  Need to speak with [facility]

## 2013-12-12 ENCOUNTER — Ambulatory Visit (INDEPENDENT_AMBULATORY_CARE_PROVIDER_SITE_OTHER): Payer: BC Managed Care – PPO | Admitting: General Surgery

## 2013-12-23 ENCOUNTER — Encounter (INDEPENDENT_AMBULATORY_CARE_PROVIDER_SITE_OTHER): Payer: Self-pay | Admitting: General Surgery

## 2013-12-23 ENCOUNTER — Ambulatory Visit (INDEPENDENT_AMBULATORY_CARE_PROVIDER_SITE_OTHER): Payer: BC Managed Care – PPO | Admitting: General Surgery

## 2013-12-23 VITALS — BP 138/82 | HR 71 | Temp 97.4°F | Resp 16 | Ht 61.0 in | Wt 294.8 lb

## 2013-12-23 DIAGNOSIS — Z6841 Body Mass Index (BMI) 40.0 and over, adult: Secondary | ICD-10-CM

## 2013-12-23 DIAGNOSIS — E785 Hyperlipidemia, unspecified: Secondary | ICD-10-CM

## 2013-12-23 DIAGNOSIS — I10 Essential (primary) hypertension: Secondary | ICD-10-CM

## 2013-12-23 DIAGNOSIS — E039 Hypothyroidism, unspecified: Secondary | ICD-10-CM

## 2013-12-23 DIAGNOSIS — E119 Type 2 diabetes mellitus without complications: Secondary | ICD-10-CM

## 2013-12-23 NOTE — Progress Notes (Signed)
Subjective:   morbid obesity  Patient ID: Gail Cruz, female   DOB: 12/03/1960, 53 y.o.   MRN: [id number]  HPI Patient is a pleasant 53 year old female referred by Dr. Thersa Salt  for consideration for surgical treatment for morbid obesity. She is the daughter of a former colon cancer patient of mine. She  gives a history of progressive obesity since a young adult after the birth of her first child despite multiple attempts at medical management. She was able to lose over 100 pounds with Weight Watchers but then regained her weight plus additional weight. her weight has been affecting her in a number of ways includingdifficulty getting any exercise or even performing routine activities and difficulty at work.  She also has developed illness is directly related to her weight including hyperlipidemia, hypertension and diabetes and is very concerned about her long term health going forward at her current weight.   She has been to our initial information seminar, researched surgical options thoroughly and is interested in gastric bypass.  Past Medical History  Diagnosis Date  . Hypertension   . Hyperlipidemia   . Diabetes mellitus without complication     diet controlled- no medicines  . Hypothyroidism   . QQIWLNLG(921.1)    Past Surgical History  Procedure Laterality Date  . Cesarean section  1991    at [place]  . Cholecystectomy    . Colonoscopy N/A 02/21/2013    Procedure: COLONOSCOPY;  Surgeon: Inda Castle, MD;  Location: [facility];  Service: Endoscopy;  Laterality: N/A;   Current Outpatient Prescriptions  Medication Sig Dispense Refill  . levothyroxine (SYNTHROID, LEVOTHROID) 100 MCG tablet TAKE ONE TABLET BY MOUTH EVERY DAY. TAKE  ON  EMPTY  STOMACH  90 tablet  3  . lisinopril (PRINIVIL,ZESTRIL) 40 MG tablet TAKE ONE TABLET BY MOUTH ONCE DAILY  30 tablet  0  . lovastatin (MEVACOR) 20 MG tablet Take 2 tablets (40 mg total) by mouth at bedtime.  180 tablet  3   No current  facility-administered medications for this visit.   Allergies  Allergen Reactions  . Ciprofloxacin     REACTION: Rash       Review of Systems  Constitutional: Negative.   HENT: Negative.   Respiratory: Negative.   Cardiovascular: Negative.   Gastrointestinal: Negative.        Mild reflux on no medications  Genitourinary: Negative.   Musculoskeletal: Negative.   Psychiatric/Behavioral:       History of depression, not active. Has been hospitalized.       Objective:   Physical Exam BP 138/82  Pulse 71  Temp(Src) 97.4 F (36.3 C) (Temporal)  Resp 16  Ht 5\' 1"  (1.549 m)  Wt 294 lb 12.8 oz (133.72 kg)  BMI 55.73 kg/m2 General: Alert, morbidly obese Caucasian female, in no distress Skin: Warm and dry without rash or infection. HEENT: No palpable masses or thyromegaly. Sclera nonicteric. Pupils equal round and reactive. Oropharynx clear. Lymph nodes: No cervical, supraclavicular, or inguinal nodes palpable. Lungs: Breath sounds clear and equal without increased work of breathing Cardiovascular: Regular rate and rhythm without murmur. No JVD or edema. Peripheral pulses intact. Abdomen: Nondistended. Soft and nontender. There is a palpable mass in the lower abdomen below the umbilicus that feels to be in the abdominal wall, possibly consistent with a chronically incarcerated hernia or fat necrosis. Extremities: No edema or joint swelling or deformity. No chronic venous stasis changes. Neurologic: Alert and fully oriented. Gait normal.    Assessment:  Patient with progressive morbid obesity unresponsive to multiple efforts at medical management who presents with a BMI of 55 point and comorbidities of diabetes mellitus currently diet-controlled, hyperlipidemia and hypertension. I believe there would be very significant medical benefit from surgical weight loss. After our discussion of surgical options currently available the patient has decided to proceed with laparoscopic  Roux-en-Y gastric bypass due to the reasons above. We have discussed the nature of the problem and the risks of remaining morbidly obese. We discussed laparoscopic Roux-en-Y gastric bypass in detail including the nature of the procedure, expected hospitalization and recovery, and major risks of anesthetic complications, bleeding, blood clots and pulmonary emboli, leakage and infection and long-term risks of stricture, ulceration, bowel obstruction, nutritional deficiencies, and failure to lose weight or weight regain. We discussed these problems could lead to death. The patient was given a complete consent form to review and all questions were answered. We will go ahead with preoperative including psychological and nutrition evaluations, H. pylori testing, ultrasound, bone density, and routine lab and x-rays. I will see the patient back following these studies.  Also like to obtain a CT scan of the abdomen and pelvis to evaluate her palpable abdominal wall mass, possible hernia.    Plan:     Begin workup for gastric bypass and evaluation of abdominal wall mass or hernia as above

## 2013-12-26 ENCOUNTER — Ambulatory Visit
Admission: RE | Admit: 2013-12-26 | Discharge: 2013-12-26 | Disposition: A | Discharge status: Home / Self Care | Payer: BC Managed Care – PPO | Source: Ambulatory Visit | Attending: General Surgery | Admitting: General Surgery

## 2013-12-26 DIAGNOSIS — E119 Type 2 diabetes mellitus without complications: Secondary | ICD-10-CM

## 2013-12-26 DIAGNOSIS — Z6841 Body Mass Index (BMI) 40.0 and over, adult: Principal | ICD-10-CM

## 2013-12-26 DIAGNOSIS — I10 Essential (primary) hypertension: Secondary | ICD-10-CM

## 2013-12-26 DIAGNOSIS — E039 Hypothyroidism, unspecified: Secondary | ICD-10-CM

## 2013-12-26 DIAGNOSIS — E785 Hyperlipidemia, unspecified: Secondary | ICD-10-CM

## 2013-12-26 MED ORDER — IOHEXOL 300 MG/ML  SOLN
125.0000 mL | Freq: Once | INTRAMUSCULAR | Status: AC | PRN
  Administered 2013-12-26: 125 mL via INTRAVENOUS

## 2013-12-31 ENCOUNTER — Telehealth (INDEPENDENT_AMBULATORY_CARE_PROVIDER_SITE_OTHER): Payer: Self-pay

## 2013-12-31 ENCOUNTER — Telehealth (INDEPENDENT_AMBULATORY_CARE_PROVIDER_SITE_OTHER): Payer: Self-pay | Admitting: General Surgery

## 2013-12-31 ENCOUNTER — Other Ambulatory Visit (INDEPENDENT_AMBULATORY_CARE_PROVIDER_SITE_OTHER): Payer: Self-pay | Admitting: General Surgery

## 2013-12-31 DIAGNOSIS — R911 Solitary pulmonary nodule: Secondary | ICD-10-CM

## 2013-12-31 NOTE — Telephone Encounter (Signed)
Patient asking for CT  Results , Ct results did indicate Ventral abdominal wall hernia . Advised her I would inform DR. Hoxworth she would like for him to call her about her results. I did not give her the  results .

## 2013-12-31 NOTE — Telephone Encounter (Signed)
Discussed with patient the results of ventral hernia on her CT scan. Also discussed a 6 mm right lower lobe pulmonary nodule. She is a nonsmoker. We discussed repeating her CT scan of the chest in 12 months.

## 2013-12-31 NOTE — Telephone Encounter (Signed)
Tried to call patient about CT scan results. No answer.

## 2014-01-06 ENCOUNTER — Telehealth: Payer: Self-pay | Admitting: Family Medicine

## 2014-01-06 LAB — T4: T4, Total: 9.3 ug/dL (ref 5.0–12.5)

## 2014-01-06 MED ORDER — LISINOPRIL 40 MG PO TABS: ORAL_TABLET | ORAL | Status: DC

## 2014-01-06 NOTE — Telephone Encounter (Signed)
Pt called to request a refill on her BP medication. jw

## 2014-01-07 LAB — URINALYSIS, ROUTINE W REFLEX MICROSCOPIC
Bilirubin Urine: NEGATIVE
Glucose, UA: NEGATIVE mg/dL
HGB URINE DIPSTICK: NEGATIVE
Ketones, ur: NEGATIVE mg/dL
LEUKOCYTES UA: NEGATIVE
NITRITE: NEGATIVE
PH: 5.5 (ref 5.0–8.0)
Protein, ur: NEGATIVE mg/dL
Specific Gravity, Urine: 1.029 (ref 1.005–1.030)
Urobilinogen, UA: 0.2 mg/dL (ref 0.0–1.0)

## 2014-01-14 ENCOUNTER — Encounter (HOSPITAL_COMMUNITY)
Admission: RE | Disposition: A | Discharge status: Home / Self Care | Payer: BC Managed Care – PPO | Source: Ambulatory Visit | Attending: General Surgery

## 2014-01-14 ENCOUNTER — Ambulatory Visit (HOSPITAL_COMMUNITY)
Admission: RE | Admit: 2014-01-14 | Discharge: 2014-01-14 | Disposition: A | Discharge status: Home / Self Care | Payer: BC Managed Care – PPO | Source: Ambulatory Visit | Attending: General Surgery | Admitting: General Surgery

## 2014-01-14 DIAGNOSIS — E669 Obesity, unspecified: Secondary | ICD-10-CM | POA: Insufficient documentation

## 2014-01-14 DIAGNOSIS — Z0189 Encounter for other specified special examinations: Secondary | ICD-10-CM | POA: Insufficient documentation

## 2014-01-14 HISTORY — PX: BREATH TEK H PYLORI: SHX5422

## 2014-01-14 SURGERY — BREATH TEST, FOR HELICOBACTER PYLORI

## 2014-01-14 NOTE — Progress Notes (Signed)
01/14/14 [place]  Referring MD Dr. Excell Seltzer  Baseline Breath At: 0832  Pranactin Given At: 2992  Post-Dose Breath At: 0847  Sample 1 4.2  Sample 2 2.2  Test Postive

## 2014-01-14 NOTE — Progress Notes (Signed)
01/14/14 1440  [place]  Referring MD Dr. Excell Seltzer  Time of Last PO Intake 0000  Baseline Breath At: 0832  Pranactin Given At: 5625  Post-Dose Breath At: 0847  Sample 1 4.2  Sample 2 2.2  Test Postive

## 2014-01-15 ENCOUNTER — Encounter (HOSPITAL_COMMUNITY): Payer: Self-pay | Admitting: General Surgery

## 2014-01-25 ENCOUNTER — Other Ambulatory Visit: Payer: Self-pay | Admitting: Family Medicine

## 2014-01-28 ENCOUNTER — Other Ambulatory Visit: Payer: Self-pay

## 2014-01-28 ENCOUNTER — Ambulatory Visit (HOSPITAL_COMMUNITY)
Admission: RE | Admit: 2014-01-28 | Discharge: 2014-01-28 | Disposition: A | Discharge status: Home / Self Care | Payer: BC Managed Care – PPO | Source: Ambulatory Visit | Attending: General Surgery | Admitting: General Surgery

## 2014-01-28 DIAGNOSIS — I10 Essential (primary) hypertension: Secondary | ICD-10-CM

## 2014-01-28 DIAGNOSIS — K219 Gastro-esophageal reflux disease without esophagitis: Secondary | ICD-10-CM | POA: Insufficient documentation

## 2014-01-28 DIAGNOSIS — E119 Type 2 diabetes mellitus without complications: Secondary | ICD-10-CM

## 2014-01-28 DIAGNOSIS — K449 Diaphragmatic hernia without obstruction or gangrene: Secondary | ICD-10-CM | POA: Insufficient documentation

## 2014-01-28 DIAGNOSIS — Z6841 Body Mass Index (BMI) 40.0 and over, adult: Principal | ICD-10-CM

## 2014-01-28 DIAGNOSIS — Z9089 Acquired absence of other organs: Secondary | ICD-10-CM | POA: Insufficient documentation

## 2014-01-28 DIAGNOSIS — E785 Hyperlipidemia, unspecified: Secondary | ICD-10-CM

## 2014-01-28 DIAGNOSIS — E039 Hypothyroidism, unspecified: Secondary | ICD-10-CM

## 2014-02-05 ENCOUNTER — Ambulatory Visit: Payer: BC Managed Care – PPO | Admitting: Dietician

## 2014-03-11 ENCOUNTER — Encounter: Payer: Self-pay | Admitting: Dietician

## 2014-03-11 ENCOUNTER — Encounter: Payer: BC Managed Care – PPO | Attending: General Surgery | Admitting: Dietician

## 2014-03-11 VITALS — Ht 61.0 in | Wt 292.6 lb

## 2014-03-11 DIAGNOSIS — Z6841 Body Mass Index (BMI) 40.0 and over, adult: Secondary | ICD-10-CM | POA: Diagnosis not present

## 2014-03-11 DIAGNOSIS — Z713 Dietary counseling and surveillance: Secondary | ICD-10-CM | POA: Insufficient documentation

## 2014-03-11 NOTE — Progress Notes (Signed)
  Pre-Op Assessment Visit:  Pre-Operative RYGB Surgery  Medical Nutrition Therapy:  Appt start time: 950   End time:  8850.  Patient was seen on 03/11/2014 for Pre-Operative RYGB Nutrition Assessment. Assessment and letter of approval faxed to [facility] Bariatric Surgery Program coordinator on 03/11/2014.   Preferred Learning Style:   No preference indicated   Learning Readiness:  Contemplating  Handouts given during visit include:  Pre-Op Goals Bariatric Surgery Protein Shakes  Teaching Method Utilized:  Visual Auditory Hands on  Barriers to learning/adherence to lifestyle change: still smoking, seemed reluctant to reduce carbs and fats  Demonstrated degree of understanding via:  Teach Back   Patient to call the [facility] to enroll in Pre-Op and Post-Op Nutrition Education when surgery date is scheduled.

## 2014-03-11 NOTE — Patient Instructions (Signed)
Follow Pre-Op Goals Try Protein Shakes Call [facility] at [phone] when surgery is scheduled to enroll in Pre-Op Class 

## 2014-04-24 ENCOUNTER — Other Ambulatory Visit: Payer: Self-pay | Admitting: Family Medicine

## 2014-06-16 ENCOUNTER — Other Ambulatory Visit (INDEPENDENT_AMBULATORY_CARE_PROVIDER_SITE_OTHER): Payer: Self-pay | Admitting: General Surgery

## 2014-06-29 ENCOUNTER — Encounter: Payer: BC Managed Care – PPO | Attending: General Surgery

## 2014-06-29 VITALS — Ht 61.0 in | Wt 275.0 lb

## 2014-06-29 DIAGNOSIS — Z713 Dietary counseling and surveillance: Secondary | ICD-10-CM | POA: Diagnosis not present

## 2014-06-29 DIAGNOSIS — Z6841 Body Mass Index (BMI) 40.0 and over, adult: Secondary | ICD-10-CM | POA: Diagnosis not present

## 2014-06-30 ENCOUNTER — Encounter (HOSPITAL_COMMUNITY): Payer: Self-pay

## 2014-06-30 ENCOUNTER — Encounter (HOSPITAL_COMMUNITY)
Admission: RE | Admit: 2014-06-30 | Discharge: 2014-06-30 | Disposition: A | Discharge status: Home / Self Care | Payer: BC Managed Care – PPO | Source: Ambulatory Visit | Attending: General Surgery | Admitting: General Surgery

## 2014-06-30 DIAGNOSIS — Z01812 Encounter for preprocedural laboratory examination: Secondary | ICD-10-CM | POA: Diagnosis not present

## 2014-06-30 HISTORY — DX: Claustrophobia: F40.240

## 2014-06-30 HISTORY — DX: Personal history of other diseases of the digestive system: Z87.19

## 2014-06-30 HISTORY — DX: Gastro-esophageal reflux disease without esophagitis: K21.9

## 2014-06-30 HISTORY — DX: Major depressive disorder, single episode, unspecified: F32.9

## 2014-06-30 LAB — COMPREHENSIVE METABOLIC PANEL
ALT: 22 U/L (ref 0–35)
ANION GAP: 11 (ref 5–15)
AST: 18 U/L (ref 0–37)
Albumin: 3.7 g/dL (ref 3.5–5.2)
Alkaline Phosphatase: 75 U/L (ref 39–117)
BUN: 20 mg/dL (ref 6–23)
CO2: 27 mEq/L (ref 19–32)
Calcium: 10.5 mg/dL (ref 8.4–10.5)
Chloride: 101 mEq/L (ref 96–112)
Creatinine, Ser: 0.81 mg/dL (ref 0.50–1.10)
GFR calc non Af Amer: 83 mL/min — ABNORMAL LOW (ref >90)
GLUCOSE: 104 mg/dL — AB (ref 70–99)
Potassium: 4.7 mEq/L (ref 3.7–5.3)
Sodium: 139 mEq/L (ref 137–147)
Total Bilirubin: 0.4 mg/dL (ref 0.3–1.2)
Total Protein: 7.5 g/dL (ref 6.0–8.3)

## 2014-06-30 LAB — CBC WITH DIFFERENTIAL/PLATELET
Basophils Absolute: 0 10*3/uL (ref 0.0–0.1)
Basophils Relative: 0 % (ref 0–1)
EOS ABS: 0.2 10*3/uL (ref 0.0–0.7)
Eosinophils Relative: 2 % (ref 0–5)
HCT: 41.9 % (ref 36.0–46.0)
Hemoglobin: 13.6 g/dL (ref 12.0–15.0)
LYMPHS ABS: 3 10*3/uL (ref 0.7–4.0)
Lymphocytes Relative: 39 % (ref 12–46)
MCH: 27.9 pg (ref 26.0–34.0)
MCHC: 32.5 g/dL (ref 30.0–36.0)
MCV: 86 fL (ref 78.0–100.0)
Monocytes Absolute: 0.5 10*3/uL (ref 0.1–1.0)
Monocytes Relative: 6 % (ref 3–12)
Neutro Abs: 4 10*3/uL (ref 1.7–7.7)
Neutrophils Relative %: 53 % (ref 43–77)
Platelets: 208 10*3/uL (ref 150–400)
RBC: 4.87 MIL/uL (ref 3.87–5.11)
RDW: 14.1 % (ref 11.5–15.5)
WBC: 7.6 10*3/uL (ref 4.0–10.5)

## 2014-06-30 LAB — HCG, SERUM, QUALITATIVE: Preg, Serum: NEGATIVE

## 2014-06-30 NOTE — Progress Notes (Signed)
  Pre-Operative Nutrition Class:  Appt start time: 7340   End time:  1830.  Patient was seen on 06/29/2014 for Pre-Operative Bariatric Surgery Education at the [facility].   Surgery date: 07/13/2014 Surgery type: RYGB Start weight at [facility]: 292.5 lbs on 05/29/2014 Weight today: 275 lbs  TANITA  BODY COMP RESULTS  06/29/14   BMI (kg/m^2) 52   Fat Mass (lbs) 148.5   Fat Free Mass (lbs) 126.5   Total Body Water (lbs) 92.5   Samples given per MNT protocol. Patient educated on appropriate usage: PB2 (qty 1) Lot #: [id number] Exp: 03/2015  Unjury protein powder (strawberry - qty 1) Lot #: [id number] Exp: 09/2015  Celebrate Vitamins Multivitamin (pineapple strawberry - qty 1) Lot #: [id number] Exp: 11/2014  Bariatric Advantage MVI (orange - qty 1) Lot #: [id number] Exp: 12/2015  Bariatric Advantage vitamin B12 (peppermint - qty 1) Lot #: [id number] Exp: 07/2014  The following the learning objectives were met by the patient during this course:  Identify Pre-Op Dietary Goals and will begin 2 weeks pre-operatively  Identify appropriate sources of fluids and proteins   State protein recommendations and appropriate sources pre and post-operatively  Identify Post-Operative Dietary Goals and will follow for 2 weeks post-operatively  Identify appropriate multivitamin and calcium sources  Describe the need for physical activity post-operatively and will follow MD recommendations  State when to call healthcare provider regarding medication questions or post-operative complications  Handouts given during class include:  Pre-Op Bariatric Surgery Diet Handout  Protein Shake Handout  Post-Op Bariatric Surgery Nutrition Handout  [facility]EL[facility] Program Information Flyer  Support Group Information Flyer  [facility] Bariatric Supplements Price List  Follow-Up Plan: Patient will follow-up at [facility] 2 weeks post operatively for diet advancement per MD.

## 2014-06-30 NOTE — Patient Instructions (Signed)
Gail Cruz  06/30/2014   Your procedure is scheduled on: Monday 07/13/14  Report to [facility] at 05:15 AM.  Call this number if you have problems the morning of surgery 336-: [phone]   Remember:   Do not eat food or drink liquids After Midnight.     Take these medicines the morning of surgery with A SIP OF WATER: levothyroxine    Do not wear jewelry, make-up or nail polish.  Do not wear lotions, powders, or perfumes.   Do not shave 48 hours prior to surgery. Men may shave face and neck.  Do not bring valuables to the hospital.  Contacts, dentures or bridgework may not be worn into surgery.  Leave suitcase in the car. After surgery it may be brought to your room.  For patients admitted to the hospital, checkout time is 11:00 AM the day of discharge.   [facility] - Preparing for Surgery Before surgery, you can play an important role.  Because skin is not sterile, your skin needs to be as free of germs as possible.  You can reduce the number of germs on your skin by washing with CHG (chlorahexidine gluconate) soap before surgery.  CHG is an antiseptic cleaner which kills germs and bonds with the skin to continue killing germs even after washing. Please DO NOT use if you have an allergy to CHG or antibacterial soaps.  If your skin becomes reddened/irritated stop using the CHG and inform your nurse when you arrive at Short Stay. Do not shave (including legs and underarms) for at least 48 hours prior to the first CHG shower.  You may shave your face/neck. Please follow these instructions carefully:  1.  Shower with CHG Soap the night before surgery and the  morning of Surgery.  2.  If you choose to wash your hair, wash your hair first as usual with your  normal  shampoo.  3.  After you shampoo, rinse your hair and body thoroughly to remove the  shampoo.                            4.  Use CHG as you would any other liquid soap.  You can apply chg directly  to the  skin and wash                       Gently with a scrungie or clean washcloth.  5.  Apply the CHG Soap to your body ONLY FROM THE NECK DOWN.   Do not use on face/ open                           Wound or open sores. Avoid contact with eyes, ears mouth and genitals (private parts).                       Wash face,  Genitals (private parts) with your normal soap.             6.  Wash thoroughly, paying special attention to the area where your surgery  will be performed.  7.  Thoroughly rinse your body with warm water from the neck down.  8.  DO NOT shower/wash with your normal soap after using and rinsing off  the CHG Soap.  9.  Pat yourself dry with a clean towel.            10.  Wear clean pajamas.            11.  Place clean sheets on your bed the night of your first shower and do not  sleep with pets. Day of Surgery : Do not apply any lotions/deodorants the morning of surgery.  Please wear clean clothes to the hospital/surgery center.  FAILURE TO FOLLOW THESE INSTRUCTIONS MAY RESULT IN THE CANCELLATION OF YOUR SURGERY PATIENT SIGNATURE_________________________________  NURSE SIGNATURE__________________________________  ________________________________________________________________________

## 2014-06-30 NOTE — Progress Notes (Signed)
Chest x-ray 01/28/14 on EPIC, EKG 01/28/14 on EPIC

## 2014-07-12 NOTE — Anesthesia Preprocedure Evaluation (Addendum)
Anesthesia Evaluation  Patient identified by MRN, date of birth, ID band Patient awake    Reviewed: Allergy & Precautions, H&P , NPO status , Patient's Chart, lab work & pertinent test results  History of Anesthesia Complications Negative for: history of anesthetic complications  Airway Mallampati: III  TM Distance: >3 FB Neck ROM: Full    Dental no notable dental hx. (+) Dental Advisory Given, Partial Lower, Poor Dentition   Pulmonary Current Smoker,  breath sounds clear to auscultation  Pulmonary exam normal       Cardiovascular hypertension, Pt. on medications Rhythm:Regular Rate:Normal     Neuro/Psych  Headaches, PSYCHIATRIC DISORDERS Anxiety Depression    GI/Hepatic Neg liver ROS, GERD-  Medicated and Controlled,  Endo/Other  diabetes, Type 2Hypothyroidism Morbid obesity  Renal/GU negative Renal ROS  negative genitourinary   Musculoskeletal negative musculoskeletal ROS (+)   Abdominal (+) + obese,   Peds negative pediatric ROS (+)  Hematology negative hematology ROS (+)   Anesthesia Other Findings   Reproductive/Obstetrics negative OB ROS                            Anesthesia Physical Anesthesia Plan  ASA: III  Anesthesia Plan: General   Post-op Pain Management:    Induction: Intravenous  Airway Management Planned: Oral ETT  Additional Equipment:   Intra-op Plan:   Post-operative Plan: Extubation in OR  Informed Consent: I have reviewed the patients History and Physical, chart, labs and discussed the procedure including the risks, benefits and alternatives for the proposed anesthesia with the patient or authorized representative who has indicated his/her understanding and acceptance.   Dental advisory given  Plan Discussed with: CRNA  Anesthesia Plan Comments:         Anesthesia Quick Evaluation

## 2014-07-13 ENCOUNTER — Inpatient Hospital Stay (HOSPITAL_COMMUNITY): Payer: BC Managed Care – PPO | Admitting: Anesthesiology

## 2014-07-13 ENCOUNTER — Encounter (HOSPITAL_COMMUNITY)
Admission: RE | Disposition: A | Discharge status: Home / Self Care | Payer: Self-pay | Source: Ambulatory Visit | Attending: General Surgery

## 2014-07-13 ENCOUNTER — Inpatient Hospital Stay (HOSPITAL_COMMUNITY)
Admission: RE | Admit: 2014-07-13 | Discharge: 2014-07-15 | DRG: 621 | Disposition: A | Discharge status: Home / Self Care | Payer: BC Managed Care – PPO | Source: Ambulatory Visit | Attending: General Surgery | Admitting: General Surgery

## 2014-07-13 ENCOUNTER — Encounter (HOSPITAL_COMMUNITY): Payer: Self-pay | Admitting: *Deleted

## 2014-07-13 DIAGNOSIS — K429 Umbilical hernia without obstruction or gangrene: Secondary | ICD-10-CM | POA: Diagnosis present

## 2014-07-13 DIAGNOSIS — E119 Type 2 diabetes mellitus without complications: Secondary | ICD-10-CM | POA: Diagnosis present

## 2014-07-13 DIAGNOSIS — I10 Essential (primary) hypertension: Secondary | ICD-10-CM | POA: Diagnosis present

## 2014-07-13 DIAGNOSIS — E039 Hypothyroidism, unspecified: Secondary | ICD-10-CM | POA: Diagnosis present

## 2014-07-13 DIAGNOSIS — E780 Pure hypercholesterolemia: Secondary | ICD-10-CM | POA: Diagnosis present

## 2014-07-13 DIAGNOSIS — Z6841 Body Mass Index (BMI) 40.0 and over, adult: Secondary | ICD-10-CM

## 2014-07-13 DIAGNOSIS — E079 Disorder of thyroid, unspecified: Secondary | ICD-10-CM | POA: Diagnosis present

## 2014-07-13 DIAGNOSIS — Z9884 Bariatric surgery status: Secondary | ICD-10-CM

## 2014-07-13 DIAGNOSIS — E785 Hyperlipidemia, unspecified: Secondary | ICD-10-CM | POA: Diagnosis present

## 2014-07-13 HISTORY — PX: GASTRIC ROUX-EN-Y: SHX5262

## 2014-07-13 LAB — HEMOGLOBIN AND HEMATOCRIT, BLOOD
HCT: 37.4 % (ref 36.0–46.0)
HEMOGLOBIN: 12.4 g/dL (ref 12.0–15.0)

## 2014-07-13 LAB — GLUCOSE, CAPILLARY
GLUCOSE-CAPILLARY: 121 mg/dL — AB (ref 70–99)
GLUCOSE-CAPILLARY: 191 mg/dL — AB (ref 70–99)

## 2014-07-13 LAB — HCG, SERUM, QUALITATIVE: Preg, Serum: NEGATIVE

## 2014-07-13 SURGERY — LAPAROSCOPIC ROUX-EN-Y GASTRIC BYPASS WITH UPPER ENDOSCOPY
Anesthesia: General

## 2014-07-13 MED ORDER — UNJURY CHICKEN SOUP POWDER: 2.0000 [oz_av] | Freq: Four times a day (QID) | ORAL | Status: DC

## 2014-07-13 MED ORDER — SUCCINYLCHOLINE CHLORIDE 20 MG/ML IJ SOLN
INTRAMUSCULAR | Status: DC | PRN
  Administered 2014-07-13: 100 mg via INTRAVENOUS

## 2014-07-13 MED ORDER — PNEUMOCOCCAL VAC POLYVALENT 25 MCG/0.5ML IJ INJ
0.5000 mL | INJECTION | INTRAMUSCULAR | Status: DC | PRN
  Filled 2014-07-13: qty 0.5

## 2014-07-13 MED ORDER — ACETAMINOPHEN 10 MG/ML IV SOLN
1000.0000 mg | Freq: Once | INTRAVENOUS | Status: AC
  Administered 2014-07-13: 1000 mg via INTRAVENOUS
  Filled 2014-07-13: qty 100

## 2014-07-13 MED ORDER — FENTANYL CITRATE 0.05 MG/ML IJ SOLN
INTRAMUSCULAR | Status: AC
  Filled 2014-07-13: qty 2

## 2014-07-13 MED ORDER — METOCLOPRAMIDE HCL 5 MG/ML IJ SOLN
INTRAMUSCULAR | Status: DC | PRN
  Administered 2014-07-13: 10 mg via INTRAVENOUS

## 2014-07-13 MED ORDER — HEPARIN SODIUM (PORCINE) 5000 UNIT/ML IJ SOLN
5000.0000 [IU] | Freq: Three times a day (TID) | INTRAMUSCULAR | Status: DC
  Administered 2014-07-13 – 2014-07-15 (×5): 5000 [IU] via SUBCUTANEOUS
  Filled 2014-07-13 (×8): qty 1

## 2014-07-13 MED ORDER — EPHEDRINE SULFATE 50 MG/ML IJ SOLN
INTRAMUSCULAR | Status: DC | PRN
  Administered 2014-07-13: 5 mg via INTRAVENOUS

## 2014-07-13 MED ORDER — ALBUTEROL SULFATE (2.5 MG/3ML) 0.083% IN NEBU
2.5000 mg | INHALATION_SOLUTION | Freq: Once | RESPIRATORY_TRACT | Status: AC
  Administered 2014-07-13: 2.5 mg via RESPIRATORY_TRACT

## 2014-07-13 MED ORDER — CHLORHEXIDINE GLUCONATE CLOTH 2 % EX PADS: 6.0000 | MEDICATED_PAD | Freq: Once | CUTANEOUS | Status: DC

## 2014-07-13 MED ORDER — ONDANSETRON HCL 4 MG/2ML IJ SOLN: 4.0000 mg | Freq: Once | INTRAMUSCULAR | Status: DC | PRN

## 2014-07-13 MED ORDER — MIDAZOLAM HCL 5 MG/5ML IJ SOLN
INTRAMUSCULAR | Status: DC | PRN
  Administered 2014-07-13: 1 mg via INTRAVENOUS

## 2014-07-13 MED ORDER — LACTATED RINGERS IV SOLN
INTRAVENOUS | Status: DC | PRN
  Administered 2014-07-13 (×3): via INTRAVENOUS

## 2014-07-13 MED ORDER — CHLORHEXIDINE GLUCONATE 0.12 % MT SOLN
15.0000 mL | Freq: Two times a day (BID) | OROMUCOSAL | Status: DC
  Administered 2014-07-13 – 2014-07-15 (×4): 15 mL via OROMUCOSAL
  Filled 2014-07-13 (×6): qty 15

## 2014-07-13 MED ORDER — FENTANYL CITRATE 0.05 MG/ML IJ SOLN
INTRAMUSCULAR | Status: AC
  Filled 2014-07-13: qty 5

## 2014-07-13 MED ORDER — PROPOFOL 10 MG/ML IV BOLUS
INTRAVENOUS | Status: DC | PRN
  Administered 2014-07-13: 150 mg via INTRAVENOUS

## 2014-07-13 MED ORDER — CETYLPYRIDINIUM CHLORIDE 0.05 % MT LIQD
7.0000 mL | Freq: Two times a day (BID) | OROMUCOSAL | Status: DC
  Administered 2014-07-14 (×2): 7 mL via OROMUCOSAL

## 2014-07-13 MED ORDER — LACTATED RINGERS IR SOLN
Status: DC | PRN
  Administered 2014-07-13: 3000 mL

## 2014-07-13 MED ORDER — NEOSTIGMINE METHYLSULFATE 10 MG/10ML IV SOLN
INTRAVENOUS | Status: AC
  Filled 2014-07-13: qty 1

## 2014-07-13 MED ORDER — ENALAPRILAT 1.25 MG/ML IV SOLN
0.6250 mg | Freq: Four times a day (QID) | INTRAVENOUS | Status: DC | PRN
  Filled 2014-07-13: qty 0.5

## 2014-07-13 MED ORDER — BUPIVACAINE-EPINEPHRINE (PF) 0.25% -1:200000 IJ SOLN
INTRAMUSCULAR | Status: AC
  Filled 2014-07-13: qty 30

## 2014-07-13 MED ORDER — OXYCODONE HCL 5 MG/5ML PO SOLN
5.0000 mg | ORAL | Status: DC | PRN
  Administered 2014-07-14 – 2014-07-15 (×3): 5 mg via ORAL
  Filled 2014-07-13 (×3): qty 5

## 2014-07-13 MED ORDER — 0.9 % SODIUM CHLORIDE (POUR BTL) OPTIME
TOPICAL | Status: DC | PRN
  Administered 2014-07-13: 1000 mL

## 2014-07-13 MED ORDER — ACETAMINOPHEN 160 MG/5ML PO SOLN: 325.0000 mg | ORAL | Status: DC | PRN

## 2014-07-13 MED ORDER — STERILE WATER FOR IRRIGATION IR SOLN
Status: DC | PRN
  Administered 2014-07-13: 1500 mL

## 2014-07-13 MED ORDER — ACETAMINOPHEN 160 MG/5ML PO SOLN: 650.0000 mg | ORAL | Status: DC | PRN

## 2014-07-13 MED ORDER — DEXAMETHASONE SODIUM PHOSPHATE 4 MG/ML IJ SOLN
INTRAMUSCULAR | Status: DC | PRN
Start: 2014-07-13 — End: 2014-07-13
  Administered 2014-07-13: 5 mg via INTRAVENOUS

## 2014-07-13 MED ORDER — ONDANSETRON HCL 4 MG/2ML IJ SOLN
INTRAMUSCULAR | Status: AC
  Filled 2014-07-13: qty 2

## 2014-07-13 MED ORDER — METOCLOPRAMIDE HCL 5 MG/ML IJ SOLN
INTRAMUSCULAR | Status: AC
  Filled 2014-07-13: qty 2

## 2014-07-13 MED ORDER — UNJURY VANILLA POWDER: 2.0000 [oz_av] | Freq: Four times a day (QID) | ORAL | Status: DC

## 2014-07-13 MED ORDER — TISSEEL VH 10 ML EX KIT
PACK | CUTANEOUS | Status: DC | PRN
  Administered 2014-07-13: 10 mL

## 2014-07-13 MED ORDER — MORPHINE SULFATE 2 MG/ML IJ SOLN
2.0000 mg | INTRAMUSCULAR | Status: DC | PRN
  Administered 2014-07-13 (×2): 2 mg via INTRAVENOUS
  Administered 2014-07-13 – 2014-07-14 (×4): 4 mg via INTRAVENOUS
  Filled 2014-07-13: qty 1
  Filled 2014-07-13 (×3): qty 2
  Filled 2014-07-13: qty 1
  Filled 2014-07-13: qty 2

## 2014-07-13 MED ORDER — GLYCOPYRROLATE 0.2 MG/ML IJ SOLN
INTRAMUSCULAR | Status: DC | PRN
  Administered 2014-07-13 (×2): 0.2 mg via INTRAVENOUS

## 2014-07-13 MED ORDER — EPHEDRINE SULFATE 50 MG/ML IJ SOLN
INTRAMUSCULAR | Status: AC
  Filled 2014-07-13: qty 1

## 2014-07-13 MED ORDER — ALBUTEROL SULFATE (2.5 MG/3ML) 0.083% IN NEBU
INHALATION_SOLUTION | RESPIRATORY_TRACT | Status: AC
  Filled 2014-07-13: qty 3

## 2014-07-13 MED ORDER — PROPOFOL 10 MG/ML IV BOLUS
INTRAVENOUS | Status: AC
  Filled 2014-07-13: qty 20

## 2014-07-13 MED ORDER — KCL IN DEXTROSE-NACL 20-5-0.9 MEQ/L-%-% IV SOLN
INTRAVENOUS | Status: DC
  Administered 2014-07-13 – 2014-07-14 (×2): via INTRAVENOUS
  Administered 2014-07-14: 100 mL/h via INTRAVENOUS
  Administered 2014-07-14 – 2014-07-15 (×2): via INTRAVENOUS
  Filled 2014-07-13 (×6): qty 1000

## 2014-07-13 MED ORDER — ONDANSETRON HCL 4 MG/2ML IJ SOLN
INTRAMUSCULAR | Status: DC | PRN
  Administered 2014-07-13: 4 mg via INTRAVENOUS

## 2014-07-13 MED ORDER — LIDOCAINE HCL (CARDIAC) 20 MG/ML IV SOLN
INTRAVENOUS | Status: AC
  Filled 2014-07-13: qty 5

## 2014-07-13 MED ORDER — LEVOTHYROXINE SODIUM 100 MCG IV SOLR
50.0000 ug | Freq: Every day | INTRAVENOUS | Status: DC
  Administered 2014-07-14 – 2014-07-15 (×2): 50 ug via INTRAVENOUS
  Filled 2014-07-13 (×3): qty 5

## 2014-07-13 MED ORDER — UNJURY CHOCOLATE CLASSIC POWDER
2.0000 [oz_av] | Freq: Four times a day (QID) | ORAL | Status: DC
  Administered 2014-07-15: 2 [oz_av] via ORAL

## 2014-07-13 MED ORDER — ONDANSETRON HCL 4 MG/2ML IJ SOLN
4.0000 mg | INTRAMUSCULAR | Status: DC | PRN
  Administered 2014-07-14: 4 mg via INTRAVENOUS
  Filled 2014-07-13: qty 2

## 2014-07-13 MED ORDER — BUPIVACAINE-EPINEPHRINE 0.25% -1:200000 IJ SOLN
INTRAMUSCULAR | Status: DC | PRN
  Administered 2014-07-13: 35 mL

## 2014-07-13 MED ORDER — DEXAMETHASONE SODIUM PHOSPHATE 10 MG/ML IJ SOLN
INTRAMUSCULAR | Status: AC
  Filled 2014-07-13: qty 1

## 2014-07-13 MED ORDER — MIDAZOLAM HCL 2 MG/2ML IJ SOLN
INTRAMUSCULAR | Status: AC
  Filled 2014-07-13: qty 2

## 2014-07-13 MED ORDER — CISATRACURIUM BESYLATE (PF) 10 MG/5ML IV SOLN
INTRAVENOUS | Status: DC | PRN
  Administered 2014-07-13: 2 mg via INTRAVENOUS
  Administered 2014-07-13: 1 mg via INTRAVENOUS
  Administered 2014-07-13: 6 mg via INTRAVENOUS
  Administered 2014-07-13: 1 mg via INTRAVENOUS
  Administered 2014-07-13 (×2): 2 mg via INTRAVENOUS

## 2014-07-13 MED ORDER — LIDOCAINE HCL (CARDIAC) 20 MG/ML IV SOLN
INTRAVENOUS | Status: DC | PRN
  Administered 2014-07-13: 50 mg via INTRAVENOUS

## 2014-07-13 MED ORDER — DEXTROSE 5 % IV SOLN
2.0000 g | INTRAVENOUS | Status: AC
  Administered 2014-07-13 (×2): 2 g via INTRAVENOUS

## 2014-07-13 MED ORDER — FENTANYL CITRATE 0.05 MG/ML IJ SOLN
INTRAMUSCULAR | Status: DC | PRN
  Administered 2014-07-13: 25 ug via INTRAVENOUS
  Administered 2014-07-13 (×2): 50 ug via INTRAVENOUS
  Administered 2014-07-13: 25 ug via INTRAVENOUS
  Administered 2014-07-13 (×3): 50 ug via INTRAVENOUS

## 2014-07-13 MED ORDER — CEFOXITIN SODIUM 2 G IV SOLR
INTRAVENOUS | Status: AC
  Filled 2014-07-13 (×2): qty 2

## 2014-07-13 MED ORDER — PHENYLEPHRINE 40 MCG/ML (10ML) SYRINGE FOR IV PUSH (FOR BLOOD PRESSURE SUPPORT)
PREFILLED_SYRINGE | INTRAVENOUS | Status: AC
  Filled 2014-07-13: qty 10

## 2014-07-13 MED ORDER — CISATRACURIUM BESYLATE 20 MG/10ML IV SOLN
INTRAVENOUS | Status: AC
  Filled 2014-07-13: qty 10

## 2014-07-13 MED ORDER — FENTANYL CITRATE 0.05 MG/ML IJ SOLN
25.0000 ug | INTRAMUSCULAR | Status: DC | PRN
  Administered 2014-07-13 (×3): 50 ug via INTRAVENOUS

## 2014-07-13 MED ORDER — PHENYLEPHRINE HCL 10 MG/ML IJ SOLN
INTRAMUSCULAR | Status: DC | PRN
Start: 2014-07-13 — End: 2014-07-13
  Administered 2014-07-13: 40 ug via INTRAVENOUS
  Administered 2014-07-13 (×3): 80 ug via INTRAVENOUS
  Administered 2014-07-13 (×2): 40 ug via INTRAVENOUS

## 2014-07-13 MED ORDER — HEPARIN SODIUM (PORCINE) 5000 UNIT/ML IJ SOLN
5000.0000 [IU] | INTRAMUSCULAR | Status: AC
  Administered 2014-07-13: 5000 [IU] via SUBCUTANEOUS
  Filled 2014-07-13: qty 1

## 2014-07-13 MED ORDER — TISSEEL VH 10 ML EX KIT
PACK | CUTANEOUS | Status: AC
  Filled 2014-07-13: qty 2

## 2014-07-13 MED ORDER — NEOSTIGMINE METHYLSULFATE 10 MG/10ML IV SOLN
INTRAVENOUS | Status: DC | PRN
  Administered 2014-07-13: 3 mg via INTRAVENOUS
  Administered 2014-07-13: 2 mg via INTRAVENOUS

## 2014-07-13 MED ORDER — GLYCOPYRROLATE 0.2 MG/ML IJ SOLN
INTRAMUSCULAR | Status: AC
  Filled 2014-07-13: qty 4

## 2014-07-13 SURGICAL SUPPLY — 84 items
APL SRG 32X5 SNPLK LF DISP (MISCELLANEOUS) ×1
APPLICATOR COTTON TIP 6IN STRL (MISCELLANEOUS) ×2 IMPLANT
APPLIER CLIP ROT 13.4 12 LRG (CLIP)
APR CLP LRG 13.4X12 ROT 20 MLT (CLIP)
BAG SPEC RTRVL LRG 6X4 10 (ENDOMECHANICALS)
BLADE SURG SZ11 CARB STEEL (BLADE) ×2 IMPLANT
CABLE HIGH FREQUENCY MONO STRZ (ELECTRODE) ×2 IMPLANT
CANISTER SUCT 3000ML (MISCELLANEOUS) ×2 IMPLANT
CHLORAPREP W/TINT 26ML (MISCELLANEOUS) ×2 IMPLANT
CLIP APPLIE ROT 13.4 12 LRG (CLIP) IMPLANT
CLIP SUT LAPRA TY ABSORB (SUTURE) ×4 IMPLANT
CUTTER FLEX LINEAR 45M (STAPLE) ×2 IMPLANT
DECANTER SPIKE VIAL GLASS SM (MISCELLANEOUS) ×1 IMPLANT
DEVICE SUTURE ENDOST 10MM (ENDOMECHANICALS) IMPLANT
DEVICE TROCAR PUNCTURE CLOSURE (ENDOMECHANICALS) ×1 IMPLANT
DISSECTOR BLUNT TIP ENDO 5MM (MISCELLANEOUS) IMPLANT
DRAIN PENROSE 18X1/4 LTX STRL (WOUND CARE) ×2 IMPLANT
DRAPE CAMERA CLOSED 9X96 (DRAPES) ×2 IMPLANT
ELECT REM PT RETURN 9FT ADLT (ELECTROSURGICAL) ×2
ELECTRODE REM PT RTRN 9FT ADLT (ELECTROSURGICAL) ×1 IMPLANT
GAUZE SPONGE 4X4 12PLY STRL (GAUZE/BANDAGES/DRESSINGS) ×1 IMPLANT
GAUZE SPONGE 4X4 16PLY XRAY LF (GAUZE/BANDAGES/DRESSINGS) ×2 IMPLANT
GLOVE BIOGEL PI IND STRL 7.5 (GLOVE) ×1 IMPLANT
GLOVE BIOGEL PI INDICATOR 7.5 (GLOVE) ×2
GLOVE SS BIOGEL STRL SZ 7.5 (GLOVE) ×1 IMPLANT
GLOVE SUPERSENSE BIOGEL SZ 7.5 (GLOVE) ×1
GOWN STRL REUS W/TWL XL LVL3 (GOWN DISPOSABLE) ×6 IMPLANT
HEMOSTAT SURGICEL 4X8 (HEMOSTASIS) IMPLANT
HOVERMATT SINGLE USE (MISCELLANEOUS) ×2 IMPLANT
KIT BASIN OR (CUSTOM PROCEDURE TRAY) ×2 IMPLANT
KIT GASTRIC LAVAGE 34FR ADT (SET/KITS/TRAYS/PACK) ×2 IMPLANT
LIQUID BAND (GAUZE/BANDAGES/DRESSINGS) ×2 IMPLANT
NDL SPNL 22GX3.5 QUINCKE BK (NEEDLE) ×1 IMPLANT
NEEDLE SPNL 22GX3.5 QUINCKE BK (NEEDLE) ×2 IMPLANT
PACK CARDIOVASCULAR III (CUSTOM PROCEDURE TRAY) ×2 IMPLANT
PEN SKIN MARKING BROAD (MISCELLANEOUS) ×2 IMPLANT
POUCH SPECIMEN RETRIEVAL 10MM (ENDOMECHANICALS) IMPLANT
RELOAD 45 VASCULAR/THIN (ENDOMECHANICALS) ×2 IMPLANT
RELOAD ENDO STITCH 2.0 (ENDOMECHANICALS)
RELOAD STAPLE 45 2.5 WHT GRN (ENDOMECHANICALS) ×1 IMPLANT
RELOAD STAPLE 45 3.5 BLU ETS (ENDOMECHANICALS) ×1 IMPLANT
RELOAD STAPLE 60 2.6 WHT THN (STAPLE) ×1 IMPLANT
RELOAD STAPLE 60 3.6 BLU REG (STAPLE) ×2 IMPLANT
RELOAD STAPLE 60 3.8 GOLD REG (STAPLE) ×1 IMPLANT
RELOAD STAPLE TA45 3.5 REG BLU (ENDOMECHANICALS) ×2 IMPLANT
RELOAD STAPLER BLUE 60MM (STAPLE) ×3 IMPLANT
RELOAD STAPLER GOLD 60MM (STAPLE) ×1 IMPLANT
RELOAD STAPLER WHITE 60MM (STAPLE) ×1 IMPLANT
RELOAD SUT SNGL STCH ABSRB 2-0 (ENDOMECHANICALS) IMPLANT
RELOAD SUT SNGL STCH BLK 2-0 (ENDOMECHANICALS) IMPLANT
SCISSORS LAP 5X45 EPIX DISP (ENDOMECHANICALS) ×2 IMPLANT
SEALANT SURGICAL APPL DUAL CAN (MISCELLANEOUS) ×2 IMPLANT
SET IRRIG TUBING LAPAROSCOPIC (IRRIGATION / IRRIGATOR) ×2 IMPLANT
SHEARS CURVED HARMONIC AC 45CM (MISCELLANEOUS) ×2 IMPLANT
SLEEVE ADV FIXATION 12X100MM (TROCAR) ×4 IMPLANT
SOLUTION ANTI FOG 6CC (MISCELLANEOUS) ×2 IMPLANT
STAPLE ECHEON FLEX 60 POW ENDO (STAPLE) ×1 IMPLANT
STAPLER ECHELON LONG 60 440 (INSTRUMENTS) IMPLANT
STAPLER RELOAD BLUE 60MM (STAPLE) ×6
STAPLER RELOAD GOLD 60MM (STAPLE) ×2
STAPLER RELOAD WHITE 60MM (STAPLE) ×2
STAPLER VISISTAT 35W (STAPLE) ×2 IMPLANT
SUT DEVICE BRAIDED 0X39 (SUTURE) IMPLANT
SUT DVC SILK 2.0X39 (SUTURE) ×10 IMPLANT
SUT DVC VICRYL PGA 2.0X39 (SUTURE) ×10 IMPLANT
SUT MNCRL AB 4-0 PS2 18 (SUTURE) ×2 IMPLANT
SUT RELOAD ENDO STITCH 2 48X1 (ENDOMECHANICALS)
SUT RELOAD ENDO STITCH 2.0 (ENDOMECHANICALS)
SUT VIC AB 0 UR5 27 (SUTURE) ×3 IMPLANT
SUT VIC AB 2-0 SH 27 (SUTURE)
SUT VIC AB 2-0 SH 27X BRD (SUTURE) IMPLANT
SUTURE RELOAD END STTCH 2 48X1 (ENDOMECHANICALS) IMPLANT
SUTURE RELOAD ENDO STITCH 2.0 (ENDOMECHANICALS) IMPLANT
SYR 20CC LL (SYRINGE) ×3 IMPLANT
SYR 50ML LL SCALE MARK (SYRINGE) ×2 IMPLANT
TOWEL OR 17X26 10 PK STRL BLUE (TOWEL DISPOSABLE) ×2 IMPLANT
TOWEL OR NON WOVEN STRL DISP B (DISPOSABLE) ×2 IMPLANT
TRAY FOLEY CATH 14FRSI W/METER (CATHETERS) ×2 IMPLANT
TROCAR ADV FIXATION 12X100MM (TROCAR) ×2 IMPLANT
TROCAR ADV FIXATION 5X100MM (TROCAR) ×2 IMPLANT
TROCAR BLADELESS OPT 5 100 (ENDOMECHANICALS) ×2 IMPLANT
TROCAR XCEL 12X100 BLDLESS (ENDOMECHANICALS) ×2 IMPLANT
TUBING ENDO SMARTCAP PENTAX (MISCELLANEOUS) ×2 IMPLANT
TUBING FILTER THERMOFLATOR (ELECTROSURGICAL) ×2 IMPLANT

## 2014-07-13 NOTE — Plan of Care (Signed)
Problem: Phase I Progression Outcomes Goal: Pain controlled with appropriate interventions Outcome: Completed/Met Date Met:  07/13/14     

## 2014-07-13 NOTE — H&P (Signed)
History of Present Illness [name] MD; 06/26/2014 12:06 PM) Patient words: pre op gastric by-pass.  The patient is a 53 year old female who presents with obesity. Patient returns for a preop visit prior to planned laparoscopic Roux-en-Y gastric bypass. We reviewed her preoperative workup. No concerns on psychologic or nutrition evaluation. We did get a CT scan of her abdomen and pelvis due to the mass I felt around her umbilicus. This is a fat-containing periumbilical hernia. We discussed that we will evaluate this during surgery and see if a repair would be indicated although definitive repair is best done after weight loss. She generally has been feeling okay with no intercurrent illness. She's been working with the dietitian and has actually lost 21 pounds since her original presentation.   Other Problems Gail Cruz, [name]; 06/26/2014 11:21 AM) High blood pressure Hypercholesterolemia Thyroid Disease  Past Surgical History Gail Cruz, [name]; 06/26/2014 11:21 AM) Cesarean Section - 1 Colon Polyp Removal - Colonoscopy Gallbladder Surgery - Laparoscopic  Diagnostic Studies History Gail Cruz, [name]; 06/26/2014 11:21 AM) Colonoscopy 1-5 years ago Mammogram 1-3 years ago Pap Smear 1-5 years ago  Allergies Gail Cruz, [name]; 06/26/2014 11:22 AM) Ciprofloxacin HCl *FLUOROQUINOLONES*  Medication History Gail Costa, MA; 06/26/2014 11:23 AM) Levothyroxine Sodium (100MCG Tablet, Oral) Active. Lisinopril (40MG  Tablet, Oral) Active. Lovastatin (20MG  Tablet, Oral) Active. Multi Vitamin Daily (Oral) Active.  Social History Gail Cruz, [name]; 06/26/2014 11:21 AM) Alcohol use Remotely quit alcohol use. Caffeine use Tea. No drug use Tobacco use Former smoker.  Family History Gail Cruz, [name]; 06/26/2014 11:21 AM) Breast Cancer Mother. Colon Cancer Father. Hypertension Mother.  Pregnancy / Birth History Gail Cruz, [name]; 06/26/2014  11:21 AM) Age at menarche 83 years. Gravida 2 Irregular periods Maternal age 61-25 Para 1  Review of Systems Gail Low [name]; 06/26/2014 11:21 AM) General Not Present- Appetite Loss, Chills, Fatigue, Fever, Night Sweats, Weight Gain and Weight Loss. Skin Not Present- Change in Wart/Mole, Dryness, Hives, Jaundice, New Lesions, Non-Healing Wounds, Rash and Ulcer. HEENT Not Present- Earache, Hearing Loss, Hoarseness, Nose Bleed, Oral Ulcers, Ringing in the Ears, Seasonal Allergies, Sinus Pain, Sore Throat, Visual Disturbances, Wears glasses/contact lenses and Yellow Eyes. Respiratory Not Present- Bloody sputum, Chronic Cough, Difficulty Breathing, Snoring and Wheezing. Breast Not Present- Breast Mass, Breast Pain, Nipple Discharge and Skin Changes. Cardiovascular Not Present- Chest Pain, Difficulty Breathing Lying Down, Leg Cramps, Palpitations, Rapid Heart Rate, Shortness of Breath and Swelling of Extremities. Gastrointestinal Not Present- Abdominal Pain, Bloating, Bloody Stool, Change in Bowel Habits, Chronic diarrhea, Constipation, Difficulty Swallowing, Excessive gas, Gets full quickly at meals, Hemorrhoids, Indigestion, Nausea, Rectal Pain and Vomiting. Female Genitourinary Not Present- Frequency, Nocturia, Painful Urination, Pelvic Pain and Urgency. Musculoskeletal Not Present- Back Pain, Joint Pain, Joint Stiffness, Muscle Pain, Muscle Weakness and Swelling of Extremities. Neurological Not Present- Decreased Memory, Fainting, Headaches, Numbness, Seizures, Tingling, Tremor, Trouble walking and Weakness. Psychiatric Not Present- Anxiety, Bipolar, Change in Sleep Pattern, Depression, Fearful and Frequent crying. Endocrine Present- Hot flashes. Not Present- Cold Intolerance, Excessive Hunger, Hair Changes, Heat Intolerance and New Diabetes. Hematology Not Present- Easy Bruising, Excessive bleeding, Gland problems, HIV and Persistent Infections.   Vitals Gail Costa MA; 06/26/2014  11:21 AM) 06/26/2014 11:21 AM Weight: 273.2 lb Height: 61in Body Surface Area: 2.31 m Body Mass Index: 51.62 kg/m Temp.: 97.9F(Temporal)  Pulse: 71 (Regular)  Resp.: 18 (Unlabored)  BP: 130/86 (Sitting, Left Arm, Standard)    Physical Exam Marland Kitchen T. Rembert Browe MD; 06/26/2014 12:06 PM) The physical exam findings  are as follows: Note:General: Alert, obese Caucasian female, in no distress Skin: Warm and dry without rash or infection. HEENT: No palpable masses or thyromegaly. Sclera nonicteric. Pupils equal round and reactive. Oropharynx clear. Lymph nodes: No cervical, supraclavicular, or inguinal nodes palpable. Lungs: Breath sounds clear and equal. No wheezing or increased work of breathing. Cardiovascular: Regular rate and rhythm without murmer. No JVD or edema. Peripheral pulses intact. No carotid bruits. Abdomen: Nondistended. Soft and nontender. No masses palpable. No organomegaly. No palpable hernias. Extremities: No edema or joint swelling or deformity. No chronic venous stasis changes. Neurologic: Alert and fully oriented. Gait normal. No focal weakness. Psychiatric: Normal mood and affect. Thought content appropriate with normal judgement and insight    Assessment & Plan Marland Kitchen T. Lanai Conlee MD; 06/26/2014 12:08 PM) MORBID OBESITY (278.01  E66.01) Impression: Current BMI 51.6 with comorbidities of diet-controlled diabetes mellitus, hyperlipidemia and hypertension. Ready to proceed with planned laparoscopic Roux-en-Y gastric bypass. She is given her bowel prep and pain medication prescription. We reviewed the consent form and all her questions were answered. Current Plans  Started OxyCODONE HCl 5MG /5ML, 5-10 Milliliter every four hours, as needed, 200 Milliliter, 06/26/2014, No Refill.

## 2014-07-13 NOTE — Op Note (Signed)
Name:  Gail Cruz MRN: [id number] Date of Surgery: 07/13/2014  Preop Diagnosis:  Morbid Obesity, S/P RYGB  Postop Diagnosis:  Morbid Obesity (BMI - 50.55), S/P RYGB  Procedure:  Upper endoscopy  (Intraoperative)  Surgeon:  Alphonsa Overall, M.D.  Anesthesia:  GET  Indications for procedure: Gail Cruz is a 53 y.o. female whose primary care physician is Thersa Salt, DO and has completed a Roux-en-Y gastric bypass today by Dr. Excell Seltzer.  I am doing an intraoperative upper endoscopy to evaluate the gastric pouch and the gastro-jejunal anastomosis.  Operative Note: The patient is under general anesthesia in Room #1 at [facility].  Dr. Excell Seltzer is laparoscoping the patient while I do an upper endoscopy to evaluate the stomach pouch and gastrojejunal anastomosis.  With the patient intubated, I passed the Pentax endoscope without difficulty down the esophagus.  The esophago-gastric junction was at 36 cm.  The gastro-jejunal anastomosis was at 41 cm.  The mucosa of the stomach looked viable and the staple line was intact without bleeding.  The gastro-jejunal anastomosis looked okay.  While I insufflated the stomach pouch with air, Dr. Excell Seltzer clamped off the efferent limb of the jejunum.  He then flooded the upper abdomen with saline to put the gastric pouch and gastro-jejunal anastomosis under saline.  There was no bubbling or evidence of a leak.  Photos were taken of the anastomosis.  The scope was then withdrawn.  The esophagus was unremarkable and the patient tolerated the endoscopy without difficulty.  Alphonsa Overall, MD, [facility] Pager: [phone] Office phone:  [phone]

## 2014-07-13 NOTE — Interval H&P Note (Signed)
History and Physical Interval Note:  07/13/2014 7:10 AM  Gail Cruz  has presented today for surgery, with the diagnosis of Morbid Obesity  The various methods of treatment have been discussed with the patient and family. After consideration of risks, benefits and other options for treatment, the patient has consented to  Procedure(s): LAPAROSCOPIC ROUX-EN-Y GASTRIC BYPASS WITH UPPER ENDOSCOPY-POSSIBLE HIATAL HERNIA (N/A) as a surgical intervention .  The patient's history has been reviewed, patient examined, no change in status, stable for surgery.  I have reviewed the patient's chart and labs.  Questions were answered to the patient's satisfaction.     Cathi Hazan T

## 2014-07-13 NOTE — Op Note (Signed)
Preop diagnosis: Morbid obesity, Umbilical hernia  Postop diagnosis: Morbid obesity, Umbilical hernia  Body mass index is 50.55 kg/(m^2).  Surgical procedure: Laparoscopic Roux-en-Y gastric bypass  Surgeon: Marland Kitchen T.Omolola Mittman M.D.  Asst.: Alphonsa Overall M.D.  Anesthesia: General  Complications:  None  EBL: Minimal  Drains: None  Disposition: PACU in good condition  Description of procedure: Patient is brought to the operating room and general anesthesia induced. She had received preoperative broad-spectrum IV antibiotics and subcutaneous heparin. The abdomen was widely sterilely prepped and draped. Patient timeout was performed and correct patient and procedure confirmed. Access was obtained with a 12 mm Optiview trocar in the left upper quadrant and pneumoperitoneum established without difficulty. Under direct vision 12 mm trocars were placed laterally in the right upper quadrant, right upper quadrant midclavicular line, and to the left and above the umbilicus for the camera port. A 5 mm trocar was placed laterally in the left upper quadrant. There was a hernia just beneath the umbilicus which contained a large amount of omentum. Using the harmonic scalpel and blunt dissection omentum was brought down out of the hernia defect which was discrete and measured about 2 cm just beneath the umbilicus. The omentum was brought into the upper abdomen and the transverse mesocolon elevated and the ligament of Treitz clearly identified. A 40 cm biliopancreatic limb was then carefully measured from the ligament of Treitz. The small intestine was divided at this point with a single firing of the white load linear stapler. A Penrose drain was sutured to the end of the Roux-en-Y limb for later identification. A 100 cm Roux-en-Y limb was then carefully measured. At this point a side-to-side anastomosis was created between the Roux limb and the end of the biliopancreatic limb. This was accomplished with a single  firing of the 45 mm white load linear stapler. The common enterotomy was closed with a running 2-0 Vicryl begun at either end of the enterotomy and tied centrally. The mesenteric defect was then closed with running 2-0 silk. The omentum was then divided with the harmonic scalpel up towards the transverse colon to allow mobility of the Roux limb toward the gastric pouch. The patient was then placed in steep reversed Trendelenburg. Through a 5 mm subxiphoid site the [facility] retractor was placed and the left lobe of the liver elevated with excellent exposure of the upper stomach and hiatus. The angle of Hiss was then mobilized with the harmonic scalpel. A 4 cm gastric pouch was then carefully measured along the lesser curve of the stomach. Dissection was carried along the lesser curve at this point with the Harmonic scalpel working carefully back toward the lesser sac at right angles to the lesser curve. The free lesser sac was then entered. After being sure all tubes were removed from the stomach an initial firing of the gold load 60 mm linear stapler was fired at right angles across the lesser curve for about 4 cm. The gastric pouch was further mobilized posteriorly and then the pouch was completed with 2 further firings of the 60 mm blue load linear stapler up through the previously dissected angle of His. It was ensured that the pouch was completely mobilized away from the gastric remnant. This created a nice tubular 4-5 cm gastric pouch. The staple line of the gastric remnant was then oversewn with 2-0 silk for hemostasis. The Roux limb was then brought up in an antecolic fashion with the candycane facing to the patient's left without undue tension. The gastrojejunostomy was created  with an initial posterior row of 2-0 Vicryl between the Roux limb and the staple line of the gastric pouch. Enterotomies were then made in the gastric pouch and the Roux limb with the harmonic scalpel and at approximately 2-2-1/2 cm  anastomosis was created with a single firing of the blue load linear stapler. The staple line was inspected and was intact without bleeding. The common enterotomy was then closed with running 2-0 Vicryl begun at either end and tied centrally. The wall tube was then easily passed through the anastomosis and an outer anterior layer of running 2-0 Vicryl was placed. The Ewald tube was removed. With the outlet of the gastrojejunostomy clamped and under saline irrigation the assistant performed upper endoscopy and with the gastric pouch tensely distended with air there was no evidence of leak. The pouch was desufflated. The Terance Hart defect was closed with running 2-0 silk. The abdomen was inspected for any evidence of bleeding or bowel injury and everything looked fine. I felt the umbilical defect should be closed to prevent small bowel incarceration postoperatively. A small transverse incision was made beneath the umbilicus and using the Endo Close 3-0 Vicryl sutures were placed longitudinally and after desufflated the abdomen and these were secured with complete closure of the defect.The Nathanson retractor was removed under direct vision after coating the anastomosis with Tisseel tissue sealant. All CO2 was evacuated and trochars removed. Skin incisions were closed with staples. Sponge needle and instrument counts were correct. The patient was taken to the PACU in good condition.     Edward Jolly MD, FACS  07/13/2014, 10:58 AM

## 2014-07-13 NOTE — Transfer of Care (Signed)
Immediate Anesthesia Transfer of Care Note  Patient: Gail Cruz  Procedure(s) Performed: Procedure(s): LAPAROSCOPIC ROUX-EN-Y GASTRIC BYPASS WITH UPPER ENDOSCOPY,umbilical hernia repair (N/A)  Patient Location: PACU  Anesthesia Type:General  Level of Consciousness: Patient easily awoken, sedated, comfortable, cooperative, following commands, responds to stimulation.   Airway & Oxygen Therapy: Patient spontaneously breathing, ventilating well, oxygen via simple oxygen mask.  Post-op Assessment: Report given to PACU RN, vital signs reviewed and stable, moving all extremities.   Post vital signs: Reviewed and stable.  Complications: No apparent anesthesia complications

## 2014-07-14 ENCOUNTER — Encounter (HOSPITAL_COMMUNITY): Payer: Self-pay | Admitting: General Surgery

## 2014-07-14 ENCOUNTER — Inpatient Hospital Stay (HOSPITAL_COMMUNITY): Payer: BC Managed Care – PPO

## 2014-07-14 LAB — CBC WITH DIFFERENTIAL/PLATELET
BASOS ABS: 0 10*3/uL (ref 0.0–0.1)
BASOS PCT: 0 % (ref 0–1)
EOS ABS: 0 10*3/uL (ref 0.0–0.7)
Eosinophils Relative: 0 % (ref 0–5)
HCT: 35.3 % — ABNORMAL LOW (ref 36.0–46.0)
Hemoglobin: 11.5 g/dL — ABNORMAL LOW (ref 12.0–15.0)
Lymphocytes Relative: 16 % (ref 12–46)
Lymphs Abs: 1.7 10*3/uL (ref 0.7–4.0)
MCH: 27.6 pg (ref 26.0–34.0)
MCHC: 32.6 g/dL (ref 30.0–36.0)
MCV: 84.7 fL (ref 78.0–100.0)
Monocytes Absolute: 0.9 10*3/uL (ref 0.1–1.0)
Monocytes Relative: 8 % (ref 3–12)
NEUTROS ABS: 8.1 10*3/uL — AB (ref 1.7–7.7)
NEUTROS PCT: 76 % (ref 43–77)
PLATELETS: 188 10*3/uL (ref 150–400)
RBC: 4.17 MIL/uL (ref 3.87–5.11)
RDW: 13.9 % (ref 11.5–15.5)
WBC: 10.7 10*3/uL — ABNORMAL HIGH (ref 4.0–10.5)

## 2014-07-14 LAB — HEMOGLOBIN AND HEMATOCRIT, BLOOD
HEMATOCRIT: 36.4 % (ref 36.0–46.0)
Hemoglobin: 11.6 g/dL — ABNORMAL LOW (ref 12.0–15.0)

## 2014-07-14 MED ORDER — IOHEXOL 300 MG/ML  SOLN
50.0000 mL | Freq: Once | INTRAMUSCULAR | Status: AC | PRN
  Administered 2014-07-14: 10 mL via ORAL

## 2014-07-14 NOTE — Progress Notes (Signed)
Patient alert and oriented, pain is controlled. Patient is tolerating fluids, plan to advance to protein shake tomorrow. Reviewed Gastric Bypass discharge instructions with patient and patient is able to articulate understanding. Provided information on BELT program, Support Group and [facility] outpatient pharmacy. All questions answered, will continue to monitor.    

## 2014-07-14 NOTE — Plan of Care (Signed)
Problem: Phase I Progression Outcomes Goal: Initial discharge plan identified Outcome: Completed/Met Date Met:  07/14/14     

## 2014-07-14 NOTE — Plan of Care (Signed)
Problem: Phase I Progression Outcomes Goal: Pulmonary function stable Outcome: Completed/Met Date Met:  07/14/14

## 2014-07-14 NOTE — Plan of Care (Signed)
Problem: Phase I Progression Outcomes Goal: OOB as tolerated unless otherwise ordered Outcome: Completed/Met Date Met:  07/14/14     

## 2014-07-14 NOTE — Plan of Care (Signed)
Problem: Phase I Progression Outcomes Goal: Voiding-avoid urinary catheter unless indicated Outcome: Completed/Met Date Met:  07/14/14     

## 2014-07-14 NOTE — Anesthesia Postprocedure Evaluation (Signed)
  Anesthesia Post-op Note  Patient: Gail Cruz  Procedure(s) Performed: Procedure(s) (LRB): LAPAROSCOPIC ROUX-EN-Y GASTRIC BYPASS WITH UPPER ENDOSCOPY,umbilical hernia repair (N/A)  Patient Location: PACU  Anesthesia Type: General  Level of Consciousness: awake and alert   Airway and Oxygen Therapy: Patient Spontanous Breathing  Post-op Pain: mild  Post-op Assessment: Post-op Vital signs reviewed, Patient's Cardiovascular Status Stable, Respiratory Function Stable, Patent Airway and No signs of Nausea or vomiting  Last Vitals:  Filed Vitals:   07/14/14 1000  BP: 127/74  Pulse: 76  Temp: 37.1 C  Resp: 18    Post-op Vital Signs: stable   Complications: No apparent anesthesia complications

## 2014-07-14 NOTE — Plan of Care (Signed)
Problem: Phase I Progression Outcomes Goal: Bariatric bed/trapeze per MD Outcome: Not Applicable Date Met:  07/14/14     

## 2014-07-14 NOTE — Plan of Care (Signed)
Problem: Phase I Progression Outcomes Goal: Operative site clean or minimal drainage Outcome: Completed/Met Date Met:  07/14/14

## 2014-07-14 NOTE — Plan of Care (Signed)
Problem: Phase I Progression Outcomes Goal: Other Phase I Outcomes/Goals Outcome: Completed/Met Date Met:  07/14/14     

## 2014-07-14 NOTE — Plan of Care (Signed)
Problem: Phase II Progression Outcomes Goal: Pain controlled on oral analgesia Outcome: Completed/Met Date Met:  07/14/14     

## 2014-07-14 NOTE — Discharge Instructions (Signed)

## 2014-07-14 NOTE — Plan of Care (Signed)
Problem: Phase I Progression Outcomes Goal: Diet - NPO Outcome: Completed/Met Date Met:  07/14/14     

## 2014-07-14 NOTE — Plan of Care (Signed)
Problem: Phase II Progression Outcomes Goal: Upper GI SWALLOW ACCEPTABLE Outcome: Completed/Met Date Met:  07/14/14     

## 2014-07-14 NOTE — Progress Notes (Signed)
Patient alert and oriented, Post op day 1.  Provided support and encouragement.  Encouraged pulmonary toilet, ambulation and small sips of liquids.  All questions answered.  Will continue to monitor. 

## 2014-07-14 NOTE — Plan of Care (Signed)
Problem: Phase I Progression Outcomes Goal: CPAP/BI-PAP utilized with sleeping per order Outcome: Not Applicable Date Met:  12/87/86

## 2014-07-14 NOTE — Plan of Care (Signed)
Problem: Phase I Progression Outcomes Goal: Hemodynamically stable Outcome: Completed/Met Date Met:  07/14/14

## 2014-07-14 NOTE — Plan of Care (Signed)
Problem: Phase II Progression Outcomes Goal: Surgical site without signs of infection Outcome: Completed/Met Date Met:  07/14/14     

## 2014-07-14 NOTE — Progress Notes (Signed)
Patient ID: Gail Cruz, female   DOB: May 12, 1961, 53 y.o.   MRN: [id number] 1 Day Post-Op  Subjective: Mild pain and nausea relieved with meds. Up OOB and feels well  Objective: Vital signs in last 24 hours: Temp:  [97.7 F (36.5 C)-98.8 F (37.1 C)] 98.8 F (37.1 C) (12/08 1000) Pulse Rate:  [64-82] 76 (12/08 1000) Resp:  [16-24] 18 (12/08 1000) BP: (99-130)/(52-74) 127/74 mmHg (12/08 1000) SpO2:  [92 %-100 %] 93 % (12/08 1000) Last BM Date: 07/12/14  Intake/Output from previous day: 12/07 0701 - 12/08 0700 In: 4073.3 [I.V.:4073.3] Out: 950 [Urine:900; Blood:50] Intake/Output this shift: Total I/O In: -  Out: 300 [Urine:300]  General appearance: alert, cooperative and no distress GI: normal findings: soft, non-tender Incision/Wound: Clean and dry  Lab Results:   Recent Labs  07/13/14 1752 07/14/14 0500  WBC  --  10.7*  HGB 12.4 11.5*  HCT 37.4 35.3*  PLT  --  188   BMET No results for input(s): NA, K, CL, CO2, GLUCOSE, BUN, CREATININE, CALCIUM in the last 72 hours.   Studies/Results: Dg Ugi W/water Sol Cm  07/14/2014   CLINICAL DATA:  Postop gastric bypass.  EXAM: WATER SOLUBLE UPPER GI SERIES  TECHNIQUE: Single-column upper GI series was performed using water soluble contrast.  CONTRAST:  31mL OMNIPAQUE IOHEXOL 300 MG/ML  SOLN  COMPARISON:  Upper GI 01/28/2014  FLUOROSCOPY TIME:  42 seconds  FINDINGS: Contrast flow readily through the gastroesophageal junction into the gastric remnant. Contrast flowed readily through the gastrojejunostomy into the proximal small bowel. No evidence obstruction or leak. Minimal residual contrast within the esophagus.  IMPRESSION: No evidence of obstruction or leak following Roux-en-Y gastric bypass surgery.   Electronically Signed   By: Suzy Bouchard M.D.   On: 07/14/2014 09:21    Anti-infectives: Anti-infectives    Start     Dose/Rate Route Frequency Ordered Stop   07/13/14 0509  cefOXitin (MEFOXIN) 2 g in dextrose 5 % 50 mL  IVPB     2 g100 mL/hr over 30 Minutes Intravenous On call to O.R. 07/13/14 0509 07/13/14 0920      Assessment/Plan: s/p Procedure(s): LAPAROSCOPIC ROUX-EN-Y GASTRIC BYPASS WITH UPPER ENDOSCOPY,umbilical hernia repair Doing well post op.  No apparent complications.  Start POD1 diet   LOS: 1 day    Gail Cruz 07/14/2014

## 2014-07-14 NOTE — Plan of Care (Signed)
Problem: Food- and Nutrition-Related Knowledge Deficit (NB-1.1) Goal: Nutrition education Formal process to instruct or train a patient/client in a skill or to impart knowledge to help patients/clients voluntarily manage or modify food choices and eating behavior to maintain or improve health. Outcome: Completed/Met Date Met:  07/14/14 Nutrition Education Note  Received consult for diet education per DROP protocol.   12/7 s/p LAPAROSCOPIC ROUX-EN-Y GASTRIC BYPASS WITH UPPER ENDOSCOPY,umbilical hernia repair  Discussed 2 week post op diet with pt. Emphasized that liquids must be non carbonated, non caffeinated, and sugar free. Fluid goals discussed. Pt to follow up with outpatient bariatric RD for further diet progression after 2 weeks. Multivitamins and minerals also reviewed. Teach back method used, pt expressed understanding, expect good compliance.   Diet: First 2 Weeks  You will see the nutritionist about two (2) weeks after your surgery. The nutritionist will increase the types of foods you can eat if you are handling liquids well:  If you have severe vomiting or nausea and cannot handle clear liquids lasting longer than 1 day, call your surgeon  Protein Shake  Drink at least 2 ounces of shake 5-6 times per day  Each serving of protein shakes (usually 8 - 12 ounces) should have a minimum of:  15 grams of protein  And no more than 5 grams of carbohydrate  Goal for protein each day:  Men = 80 grams per day  Women = 60 grams per day  Protein powder may be added to fluids such as non-fat milk or Lactaid milk or Soy milk (limit to 35 grams added protein powder per serving)   Hydration  Slowly increase the amount of water and other clear liquids as tolerated (See Acceptable Fluids)  Slowly increase the amount of protein shake as tolerated  Sip fluids slowly and throughout the day  May use sugar substitutes in small amounts (no more than 6 - 8 packets per day; i.e. Splenda)   Fluid Goal   The first goal is to drink at least 8 ounces of protein shake/drink per day (or as directed by the nutritionist); some examples of protein shakes are [employer], [employer], EAS Edge HP, and Unjury. See handout from pre-op Bariatric Education Class:  Slowly increase the amount of protein shake you drink as tolerated  You may find it easier to slowly sip shakes throughout the day  It is important to get your proteins in first  Your fluid goal is to drink 64 - 100 ounces of fluid daily  It may take a few weeks to build up to this  32 oz (or more) should be clear liquids  And  32 oz (or more) should be full liquids (see below for examples)  Liquids should not contain sugar, caffeine, or carbonation   Clear Liquids:  Water or Sugar-free flavored water (i.e. Fruit H2O, Propel)  Decaffeinated coffee or tea (sugar-free)  Crystal Lite, Wyler's Lite, Minute Maid Lite  Sugar-free Jell-O  Bouillon or broth  Sugar-free Popsicle: *Less than 20 calories each; Limit 1 per day   Full Liquids:  Protein Shakes/Drinks + 2 choices per day of other full liquids  Full liquids must be:  No More Than 12 grams of Carbs per serving  No More Than 3 grams of Fat per serving  Strained low-fat cream soup  Non-Fat milk  Fat-free Lactaid Milk  Sugar-free yogurt (Dannon Lite & Fit, Greek yogurt)     Clayton Bibles, MS, RD, LDN Pager: [phone] After Hours Pager: [phone]

## 2014-07-14 NOTE — Care Management Note (Signed)
    Page 1 of 1   07/14/2014     11:45:14 AM CARE MANAGEMENT NOTE 07/14/2014  Patient:  Gail Cruz,Gail Cruz   Account Number:  [id number]  Date Initiated:  07/14/2014  Documentation initiated by:  Sunday Spillers  Subjective/Objective Assessment:   53 yo female admitted s/p gastric bypass. PTA lived at home with parent.     Action/Plan:   Home when stable   Anticipated DC Date:  07/16/2014   Anticipated DC Plan:  [facility]  CM consult      Choice offered to / List presented to:             Status of service:  Completed, signed off Medicare Important Message given?   (If response is "NO", the following Medicare IM given date fields will be blank) Date Medicare IM given:   Medicare IM given by:   Date Additional Medicare IM given:   Additional Medicare IM given by:    Discharge Disposition:  HOME/SELF CARE  Per UR Regulation:  Reviewed for med. necessity/level of care/duration of stay  If discussed at [place] of Stay Meetings, dates discussed:    Comments:

## 2014-07-15 LAB — CBC WITH DIFFERENTIAL/PLATELET
BASOS ABS: 0 10*3/uL (ref 0.0–0.1)
Basophils Relative: 0 % (ref 0–1)
Eosinophils Absolute: 0.1 10*3/uL (ref 0.0–0.7)
Eosinophils Relative: 2 % (ref 0–5)
HCT: 36.2 % (ref 36.0–46.0)
Hemoglobin: 11.5 g/dL — ABNORMAL LOW (ref 12.0–15.0)
Lymphocytes Relative: 28 % (ref 12–46)
Lymphs Abs: 2.5 10*3/uL (ref 0.7–4.0)
MCH: 27.9 pg (ref 26.0–34.0)
MCHC: 31.8 g/dL (ref 30.0–36.0)
MCV: 87.9 fL (ref 78.0–100.0)
Monocytes Absolute: 0.8 10*3/uL (ref 0.1–1.0)
Monocytes Relative: 8 % (ref 3–12)
NEUTROS ABS: 5.7 10*3/uL (ref 1.7–7.7)
Neutrophils Relative %: 62 % (ref 43–77)
PLATELETS: 158 10*3/uL (ref 150–400)
RBC: 4.12 MIL/uL (ref 3.87–5.11)
RDW: 14.4 % (ref 11.5–15.5)
WBC: 9.2 10*3/uL (ref 4.0–10.5)

## 2014-07-15 NOTE — Discharge Summary (Signed)
   Patient ID: Gail Cruz [id number] 53 y.o. [birth year]  07/13/2014  Discharge date and time: 07/15/2014   Admitting Physician: Excell Seltzer T  Discharge Physician: Excell Seltzer T  Admission Diagnoses: Morbid Obesity Morbid Obesity  Discharge Diagnoses: Same  Operations: Procedure(s): LAPAROSCOPIC ROUX-EN-Y GASTRIC BYPASS WITH UPPER ENDOSCOPY,umbilical hernia repair  Admission Condition: good  Discharged Condition: good  Indication for Admission: Patient is a 53 year old female with progressive morbid obesity unresponsive to medical management who presents with comorbidities of hypertension and hyperlipidemia and chronic joint pain. After extensive preoperative workup and discussion detailed elsewhere we have elected to proceed with laparoscopic Roux-en-Y gastric bypass for treatment for her morbid obesity.  Hospital Course: On the morning of admission the patient underwent an uneventful laparoscopic Roux-en-Y gastric bypass. Her postoperative course was uncomplicated. On the first morning she was comfortable with stable vital signs. Gastrografin swallow showed no leak or obstruction and she was started on water by mouth. She was ambulatory. On the second postoperative day her vital signs are within normal limits. CBC is unremarkable. She denies pain or nausea. Abdomen is soft and nontender and wounds healing well. Tolerating protein shakes without difficulty. She is felt ready for discharge.   Disposition: Home  Patient Instructions:    Medication List    STOP taking these medications        MIDOL 200 MG Caps  Generic drug:  Ibuprofen      TAKE these medications        acetaminophen 500 MG tablet  Commonly known as:  TYLENOL  Take 500-1,000 mg by mouth every 6 (six) hours as needed for headache.     CALCIUM 500 + D PO  Take 3 tablets by mouth every morning.     levothyroxine 100 MCG tablet  Commonly known as:  SYNTHROID, LEVOTHROID  Take 100 mcg by  mouth daily before breakfast.     levothyroxine 100 MCG tablet  Commonly known as:  SYNTHROID, LEVOTHROID  TAKE ONE TABLET BY MOUTH EVERY DAY. TAKE  ON  EMPTY  STOMACH     lisinopril 40 MG tablet  Commonly known as:  PRINIVIL,ZESTRIL  Take 40 mg by mouth every morning.     lisinopril 40 MG tablet  Commonly known as:  PRINIVIL,ZESTRIL  TAKE ONE TABLET BY MOUTH ONCE DAILY     lovastatin 20 MG tablet  Commonly known as:  MEVACOR  Take 40 mg by mouth at bedtime.     lovastatin 20 MG tablet  Commonly known as:  MEVACOR  TAKE TWO TABLETS BY MOUTH AT BEDTIME     multivitamin with minerals Tabs tablet  Take 1 tablet by mouth every morning.     Vitamin B-12 500 MCG Subl  Place 1 each under the tongue every morning.        Activity: activity as tolerated Diet: bariatric protein shakes Wound Care: none needed  Follow-up:  With Dr. Excell Seltzer in 3 weeks.  Signed: Edward Jolly MD, FACS  07/15/2014, 8:45 AM

## 2014-07-20 ENCOUNTER — Telehealth (HOSPITAL_COMMUNITY): Payer: Self-pay

## 2014-07-20 NOTE — Telephone Encounter (Signed)
Made discharge phone call to patient per DROP protocol. Asking the following questions.    1. Do you have someone to care for you now that you are home?  yes 2. Are you having pain now that is not relieved by your pain medication?  no 3. Are you able to drink the recommended daily amount of fluids (48 ounces minimum/day) and protein (60-80 grams/day) as prescribed by the dietitian or nutritional counselor?  yes 4. Are you taking the vitamins and minerals as prescribed?  yes 5. Do you have the "on call" number to contact your surgeon if you have a problem or question?  yes 6. Are your incisions free of redness, swelling or drainage? (If steri strips, address that these can fall off, shower as tolerated) tes 7. Have your bowels moved since your surgery?  If not, are you passing gas?  yes 8. Are you up and walking 3-4 times per day?  yes    1. Do you have an appointment made to see your surgeon in the next month?  yes 2. Were you provided your discharge medications before your surgery or before you were discharged from the hospital and are you taking them without problem?  yes 3. Were you provided phone numbers to the clinic/surgeon's office?  yes 4. Did you watch the patient education video module in the (clinic, surgeon's office, etc.) before your surgery? yes 5. Do you have a discharge checklist that was provided to you in the hospital to reference with instructions on how to take care of yourself after surgery?  yes 6. Did you see a dietitian or nutritional counselor while you were in the hospital?  yes 7. Do you have an appointment to see a dietitian or nutritional counselor in the next month?  yes

## 2014-07-27 ENCOUNTER — Encounter: Payer: Self-pay | Admitting: Family Medicine

## 2014-07-27 ENCOUNTER — Ambulatory Visit (INDEPENDENT_AMBULATORY_CARE_PROVIDER_SITE_OTHER): Payer: BC Managed Care – PPO | Admitting: Family Medicine

## 2014-07-27 VITALS — BP 103/72 | HR 98 | Temp 98.1°F | Ht 61.0 in | Wt 258.8 lb

## 2014-07-27 DIAGNOSIS — E785 Hyperlipidemia, unspecified: Secondary | ICD-10-CM

## 2014-07-27 DIAGNOSIS — Z6841 Body Mass Index (BMI) 40.0 and over, adult: Secondary | ICD-10-CM

## 2014-07-27 DIAGNOSIS — E119 Type 2 diabetes mellitus without complications: Secondary | ICD-10-CM

## 2014-07-27 DIAGNOSIS — Z9884 Bariatric surgery status: Secondary | ICD-10-CM | POA: Diagnosis not present

## 2014-07-27 DIAGNOSIS — E039 Hypothyroidism, unspecified: Secondary | ICD-10-CM

## 2014-07-27 DIAGNOSIS — I10 Essential (primary) hypertension: Secondary | ICD-10-CM

## 2014-07-27 LAB — POCT GLYCOSYLATED HEMOGLOBIN (HGB A1C): Hemoglobin A1C: 5.2

## 2014-07-27 MED ORDER — LISINOPRIL 10 MG PO TABS: 10.0000 mg | ORAL_TABLET | Freq: Every day | ORAL | Status: DC

## 2014-07-27 NOTE — Assessment & Plan Note (Signed)
A1c 5.2 today. Patient is diet controlled.

## 2014-07-27 NOTE — Progress Notes (Signed)
   Subjective:    Patient ID: Gail Cruz, female    DOB: 1961/07/20, 53 y.o.   MRN: [id number]  HPI 53 year old morbidly obese female with past medical history of hypothyroidism, hypertension, hyperlipidemia, and type 2 diabetes presents for follow-up.  1) Morbid obesity  Patient recently underwent Roux-en-Y gastric bypass on 12/7.  She has now lost approximately 40 pounds since her visit in March.  Patient states she is currently feeling significant fatigue.  She is compliant with dietary recommendations and has been following up closely with her surgeon as well as the nutritionist.  She is currently on a liquid diet consisting primarily of protein shakes.  No reports of vomiting. She does note that she is felt nauseated.  No trouble voiding or stooling.  Abdominal pain is mild and located primarily at the incision sites.  2) HTN  Home BP Monitoring - None.  Medications - Lisinopril 40 mg.  Compliance -  Yes.   ROS: Denies chest pain, SOB, lightheadedness/dizziness   3) HLD  Compliant with lovastatin. No reported side effects. Lipid panel below. Lipid Panel     Component Value Date/Time   CHOL 172 10/29/2013 0903   TRIG 151* 10/29/2013 0903   HDL 43 10/29/2013 0903   CHOLHDL 4.0 10/29/2013 0903   VLDL 30 10/29/2013 0903   LDLCALC 99 10/29/2013 0903   4) Hypothyroidism  Stable on Synthroid.  No concerns at this time.  Social Hx: History  Substance Use Topics  . Smoking status: Current Some Day Smoker    Types: Cigarettes  . Smokeless tobacco: Never Used     Comment: none in 2 weeks  . Alcohol Use: No   Review of Systems Per HPI    Objective:   Physical Exam Filed Vitals:   07/27/14 1357  BP: 103/72  Pulse: 98  Temp: 98.1 F (36.7 C)   Exam: General: Obese female, appears fatigued, no acute distress. Cardiovascular: Tachycardic. No murmur. Respiratory: CTAB. No rales, rhonchi, or wheeze. Abdomen: soft, nontender, nondistended.  Incision site  healing well.    Assessment & Plan:  See Problem List

## 2014-07-27 NOTE — Patient Instructions (Signed)
Be sure to stay hydrated.  I have decreased your BP medication to 10 mg daily.  Follow up in ~ 1 month.  Please follow up closely with nutrition and your surgeon.  You're doing great.  Hang in there!  Happy Holidays   Dr. Lacinda Axon

## 2014-07-27 NOTE — Assessment & Plan Note (Signed)
Now status post Roux-en-Y gastric bypass. Patient doing well with diet and is losing weight. I advised her to follow closely with her surgeon as well as her nutritionist. Of note, patient was mildly tachycardic today. Advised continued aggressive fluid intake.

## 2014-07-27 NOTE — Assessment & Plan Note (Signed)
Well-controlled. Continue lovastatin. Patient will likely continue to do well given weight loss.

## 2014-07-27 NOTE — Assessment & Plan Note (Signed)
Blood pressure 103/72 today. Hypertension likely contributing to significant fatigue. Decreasing lisinopril to 10 mg daily. Patient to follow-up in one month.

## 2014-07-27 NOTE — Assessment & Plan Note (Signed)
Stable Continue Synthroid 

## 2014-07-28 ENCOUNTER — Encounter: Payer: BC Managed Care – PPO | Attending: General Surgery

## 2014-07-28 VITALS — Ht 61.0 in | Wt 254.0 lb

## 2014-07-28 DIAGNOSIS — Z713 Dietary counseling and surveillance: Secondary | ICD-10-CM | POA: Insufficient documentation

## 2014-07-28 DIAGNOSIS — Z6841 Body Mass Index (BMI) 40.0 and over, adult: Secondary | ICD-10-CM | POA: Diagnosis not present

## 2014-07-28 NOTE — Progress Notes (Signed)
Bariatric Class:  Appt start time: 1530 end time:  1630.  2 Week Post-Operative Nutrition Class  Patient was seen on 07/28/2014 for Post-Operative Nutrition education [facility].   Surgery date: 07/13/2014 Surgery type: RYGB Start weight at [facility]: 292.5 lbs on 05/29/2014 Weight today: 254 lbs  Weight change: 21 lbs   TANITA  BODY COMP RESULTS  06/29/14 07/28/14   BMI (kg/m^2) 52 48   Fat Mass (lbs) 148.5 143.0   Fat Free Mass (lbs) 126.5 111.0   Total Body Water (lbs) 92.5 81.5    The following the learning objectives were met by the patient during this course:  Identifies Phase 3A (Soft, High Proteins) Dietary Goals and will begin from 2 weeks post-operatively to 2 months post-operatively  Identifies appropriate sources of fluids and proteins   States protein recommendations and appropriate sources post-operatively  Identifies the need for appropriate texture modifications, mastication, and bite sizes when consuming solids  Identifies appropriate multivitamin and calcium sources post-operatively  Describes the need for physical activity post-operatively and will follow MD recommendations  States when to call healthcare provider regarding medication questions or post-operative complications  Handouts given during class include:  Phase 3A: Soft, High Protein Diet Handout  Follow-Up Plan: Patient will follow-up at [facility] in 6 weeks for 2 month post-op nutrition visit for diet advancement per MD.

## 2014-08-10 ENCOUNTER — Telehealth (HOSPITAL_COMMUNITY): Payer: Self-pay

## 2014-08-10 NOTE — Telephone Encounter (Signed)
Patient called in with complaints of persistent nausea, intolerance to food smells, and loose stools.  Patient has advanced to next stage of diet, including soft proteins, and limited vegetables.  Dicussed the following with patient:  1.  Call surgeons office at [phone] to discussed persistent nausea 2.  Looked into other options for protein shakes (patient is currently using Atkins shakes) offered suggestions for Isopure and Syntrax Nectar (both of which have options that are less milky) 3.  Discussed the importance of eating protein rather than drinking it at this stage.  Liquid protein shakes should supplement protein intake not replace.   4.  Gave options for foods.  Patient likes [place] and pizza as well as fish.  A) offered suggestion of [place] pepperoni and low fat cheese roll-ups  B) suggested baked, broiled or grilled fish  C) suggested trying a scrambled egg  5.  Ultimately the patient and I discussed her FEAR of vomiting.  I believe the patient is afraid of what will happen when she eats and the fear is making her nauseated.    A) I talked with the patient about HOW to eat.  Her serving size at this point should be  about 2 ounces of protein; each bite should be dime sized, each bite should be  chewed a minimum of 20 times.  B) I discussed that often times the reason that patients are unable to tolerated certain  foods is because they are not chewing well enough, or are eating too fast or taking too  big of bites.    I encouraged the patient again to contact [facility] to discuss persistent nausea, and to give the options we discussed a try and to get back in contact with me later this week to see how things are going.  Will Continue to Monitor. Vilinda Flake RN

## 2014-08-20 ENCOUNTER — Telehealth: Payer: Self-pay | Admitting: Dietician

## 2014-08-20 NOTE — Telephone Encounter (Signed)
Talked to Sarely today since he is still having intermittent nausea and vomiting. She stated that she did not get her zofran prescription filled since it was too expensive ($97). Encouraged her call the surgeon's office to discuss other option for nausea medication, try sipping on G2 or Powerade Zero if water is unappealing, and recommended that she try clear protein drinks (Unjury Chicken Soup, Unflavlored Unjury added to other drinks, Isopoure, Nectar). Recommended that she continue to chew foods well and eat protein foods when possible.

## 2014-08-26 ENCOUNTER — Encounter (HOSPITAL_COMMUNITY): Payer: Self-pay | Admitting: General Practice

## 2014-08-26 ENCOUNTER — Inpatient Hospital Stay (HOSPITAL_COMMUNITY)
Admission: AD | Admit: 2014-08-26 | Discharge: 2014-08-30 | DRG: 641 | Disposition: A | Discharge status: Home / Self Care | Payer: 59 | Source: Ambulatory Visit | Attending: Family Medicine | Admitting: Family Medicine

## 2014-08-26 ENCOUNTER — Encounter: Payer: Self-pay | Admitting: Family Medicine

## 2014-08-26 ENCOUNTER — Ambulatory Visit (INDEPENDENT_AMBULATORY_CARE_PROVIDER_SITE_OTHER): Payer: 59 | Admitting: Family Medicine

## 2014-08-26 VITALS — BP 119/84 | HR 89 | Temp 98.1°F | Ht 61.0 in | Wt 237.8 lb

## 2014-08-26 DIAGNOSIS — Z800 Family history of malignant neoplasm of digestive organs: Secondary | ICD-10-CM

## 2014-08-26 DIAGNOSIS — R1084 Generalized abdominal pain: Secondary | ICD-10-CM

## 2014-08-26 DIAGNOSIS — E860 Dehydration: Secondary | ICD-10-CM

## 2014-08-26 DIAGNOSIS — F17210 Nicotine dependence, cigarettes, uncomplicated: Secondary | ICD-10-CM | POA: Diagnosis present

## 2014-08-26 DIAGNOSIS — E785 Hyperlipidemia, unspecified: Secondary | ICD-10-CM | POA: Diagnosis present

## 2014-08-26 DIAGNOSIS — E119 Type 2 diabetes mellitus without complications: Secondary | ICD-10-CM | POA: Diagnosis present

## 2014-08-26 DIAGNOSIS — I10 Essential (primary) hypertension: Secondary | ICD-10-CM | POA: Diagnosis present

## 2014-08-26 DIAGNOSIS — E876 Hypokalemia: Secondary | ICD-10-CM | POA: Diagnosis present

## 2014-08-26 DIAGNOSIS — E039 Hypothyroidism, unspecified: Secondary | ICD-10-CM | POA: Diagnosis present

## 2014-08-26 DIAGNOSIS — R109 Unspecified abdominal pain: Secondary | ICD-10-CM | POA: Insufficient documentation

## 2014-08-26 DIAGNOSIS — K219 Gastro-esophageal reflux disease without esophagitis: Secondary | ICD-10-CM | POA: Diagnosis present

## 2014-08-26 DIAGNOSIS — K429 Umbilical hernia without obstruction or gangrene: Secondary | ICD-10-CM | POA: Diagnosis present

## 2014-08-26 DIAGNOSIS — R112 Nausea with vomiting, unspecified: Secondary | ICD-10-CM | POA: Insufficient documentation

## 2014-08-26 DIAGNOSIS — Z9884 Bariatric surgery status: Secondary | ICD-10-CM

## 2014-08-26 DIAGNOSIS — Z6841 Body Mass Index (BMI) 40.0 and over, adult: Secondary | ICD-10-CM

## 2014-08-26 DIAGNOSIS — E669 Obesity, unspecified: Secondary | ICD-10-CM | POA: Diagnosis present

## 2014-08-26 DIAGNOSIS — Z803 Family history of malignant neoplasm of breast: Secondary | ICD-10-CM

## 2014-08-26 LAB — CBC
HEMATOCRIT: 42.8 % (ref 36.0–46.0)
Hemoglobin: 14.8 g/dL (ref 12.0–15.0)
MCH: 27.8 pg (ref 26.0–34.0)
MCHC: 34.6 g/dL (ref 30.0–36.0)
MCV: 80.5 fL (ref 78.0–100.0)
Platelets: 218 10*3/uL (ref 150–400)
RBC: 5.32 MIL/uL — ABNORMAL HIGH (ref 3.87–5.11)
RDW: 15.1 % (ref 11.5–15.5)
WBC: 6.4 10*3/uL (ref 4.0–10.5)

## 2014-08-26 LAB — COMPREHENSIVE METABOLIC PANEL
ALT: 30 U/L (ref 0–35)
AST: 22 U/L (ref 0–37)
Albumin: 3.4 g/dL — ABNORMAL LOW (ref 3.5–5.2)
Alkaline Phosphatase: 89 U/L (ref 39–117)
Anion gap: 15 (ref 5–15)
BILIRUBIN TOTAL: 1.2 mg/dL (ref 0.3–1.2)
BUN: 12 mg/dL (ref 6–23)
CHLORIDE: 92 meq/L — AB (ref 96–112)
CO2: 29 mmol/L (ref 19–32)
Calcium: 9 mg/dL (ref 8.4–10.5)
Creatinine, Ser: 1.21 mg/dL — ABNORMAL HIGH (ref 0.50–1.10)
GFR, EST AFRICAN AMERICAN: 59 mL/min — AB (ref >90)
GFR, EST NON AFRICAN AMERICAN: 51 mL/min — AB (ref >90)
Glucose, Bld: 107 mg/dL — ABNORMAL HIGH (ref 70–99)
POTASSIUM: 2.1 mmol/L — AB (ref 3.5–5.1)
Sodium: 136 mmol/L (ref 135–145)
Total Protein: 7 g/dL (ref 6.0–8.3)

## 2014-08-26 LAB — MAGNESIUM: MAGNESIUM: 1.7 mg/dL (ref 1.5–2.5)

## 2014-08-26 LAB — TSH: TSH: 21.917 u[IU]/mL — ABNORMAL HIGH (ref 0.350–4.500)

## 2014-08-26 LAB — BASIC METABOLIC PANEL
Anion gap: 6 (ref 5–15)
BUN: 11 mg/dL (ref 6–23)
CHLORIDE: 96 meq/L (ref 96–112)
CO2: 34 mmol/L — AB (ref 19–32)
Calcium: 8.2 mg/dL — ABNORMAL LOW (ref 8.4–10.5)
Creatinine, Ser: 1.11 mg/dL — ABNORMAL HIGH (ref 0.50–1.10)
GFR calc Af Amer: 65 mL/min — ABNORMAL LOW (ref >90)
GFR, EST NON AFRICAN AMERICAN: 56 mL/min — AB (ref >90)
Glucose, Bld: 107 mg/dL — ABNORMAL HIGH (ref 70–99)
Potassium: 2.5 mmol/L — CL (ref 3.5–5.1)
SODIUM: 136 mmol/L (ref 135–145)

## 2014-08-26 LAB — PHOSPHORUS: Phosphorus: 2.9 mg/dL (ref 2.3–4.6)

## 2014-08-26 MED ORDER — ACETAMINOPHEN 650 MG RE SUPP: 650.0000 mg | Freq: Four times a day (QID) | RECTAL | Status: DC | PRN

## 2014-08-26 MED ORDER — LISINOPRIL 10 MG PO TABS
10.0000 mg | ORAL_TABLET | Freq: Every day | ORAL | Status: DC
  Administered 2014-08-27 – 2014-08-29 (×2): 10 mg via ORAL
  Filled 2014-08-26 (×4): qty 1

## 2014-08-26 MED ORDER — DEXTROSE-NACL 5-0.9 % IV SOLN
INTRAVENOUS | Status: DC
  Administered 2014-08-26: 14:00:00 via INTRAVENOUS

## 2014-08-26 MED ORDER — POTASSIUM CHLORIDE CRYS ER 20 MEQ PO TBCR
40.0000 meq | EXTENDED_RELEASE_TABLET | Freq: Two times a day (BID) | ORAL | Status: DC
  Administered 2014-08-26 – 2014-08-28 (×6): 40 meq via ORAL
  Filled 2014-08-26 (×11): qty 2

## 2014-08-26 MED ORDER — DOCUSATE SODIUM 100 MG PO CAPS: 100.0000 mg | ORAL_CAPSULE | Freq: Two times a day (BID) | ORAL | Status: DC | PRN

## 2014-08-26 MED ORDER — KCL IN DEXTROSE-NACL 40-5-0.9 MEQ/L-%-% IV SOLN
INTRAVENOUS | Status: DC
  Administered 2014-08-26 – 2014-08-28 (×5): via INTRAVENOUS
  Filled 2014-08-26 (×10): qty 1000

## 2014-08-26 MED ORDER — SODIUM CHLORIDE 0.9 % IV BOLUS (SEPSIS)
1000.0000 mL | Freq: Once | INTRAVENOUS | Status: AC
  Administered 2014-08-26: 1000 mL via INTRAVENOUS

## 2014-08-26 MED ORDER — LEVOTHYROXINE SODIUM 100 MCG PO TABS
100.0000 ug | ORAL_TABLET | Freq: Every day | ORAL | Status: DC
  Filled 2014-08-26: qty 1

## 2014-08-26 MED ORDER — PROMETHAZINE HCL 25 MG/ML IJ SOLN: 12.5000 mg | Freq: Four times a day (QID) | INTRAMUSCULAR | Status: DC | PRN

## 2014-08-26 MED ORDER — ACETAMINOPHEN 325 MG PO TABS
650.0000 mg | ORAL_TABLET | Freq: Four times a day (QID) | ORAL | Status: DC | PRN
  Administered 2014-08-29: 650 mg via ORAL
  Filled 2014-08-26: qty 2

## 2014-08-26 MED ORDER — ONDANSETRON HCL 4 MG/2ML IJ SOLN
4.0000 mg | Freq: Four times a day (QID) | INTRAMUSCULAR | Status: DC | PRN
  Administered 2014-08-26 – 2014-08-30 (×6): 4 mg via INTRAVENOUS
  Filled 2014-08-26 (×6): qty 2

## 2014-08-26 MED ORDER — ONDANSETRON HCL 4 MG PO TABS
4.0000 mg | ORAL_TABLET | Freq: Four times a day (QID) | ORAL | Status: DC | PRN
  Filled 2014-08-26: qty 1

## 2014-08-26 MED ORDER — ADULT MULTIVITAMIN W/MINERALS CH
1.0000 | ORAL_TABLET | Freq: Every morning | ORAL | Status: DC
  Administered 2014-08-28 – 2014-08-29 (×2): 1 via ORAL
  Filled 2014-08-26 (×4): qty 1

## 2014-08-26 MED ORDER — CYANOCOBALAMIN 500 MCG PO TABS
500.0000 ug | ORAL_TABLET | Freq: Every day | ORAL | Status: DC
  Administered 2014-08-27 – 2014-08-30 (×4): 500 ug via ORAL
  Filled 2014-08-26 (×5): qty 1

## 2014-08-26 MED ORDER — POTASSIUM CHLORIDE 10 MEQ/100ML IV SOLN
10.0000 meq | INTRAVENOUS | Status: DC
  Administered 2014-08-26: 10 meq via INTRAVENOUS
  Filled 2014-08-26 (×6): qty 100

## 2014-08-26 MED ORDER — VITAMIN B-12 500 MCG SL SUBL: 1.0000 | SUBLINGUAL_TABLET | Freq: Every morning | SUBLINGUAL | Status: DC

## 2014-08-26 MED ORDER — PRAVASTATIN SODIUM 40 MG PO TABS
40.0000 mg | ORAL_TABLET | Freq: Every day | ORAL | Status: DC
  Administered 2014-08-26 – 2014-08-29 (×4): 40 mg via ORAL
  Filled 2014-08-26 (×5): qty 1

## 2014-08-26 MED ORDER — HEPARIN SODIUM (PORCINE) 5000 UNIT/ML IJ SOLN
5000.0000 [IU] | Freq: Three times a day (TID) | INTRAMUSCULAR | Status: DC
  Administered 2014-08-26 – 2014-08-30 (×12): 5000 [IU] via SUBCUTANEOUS
  Filled 2014-08-26 (×13): qty 1

## 2014-08-26 MED ORDER — LEVOTHYROXINE SODIUM 150 MCG PO TABS
150.0000 ug | ORAL_TABLET | Freq: Every day | ORAL | Status: DC
  Administered 2014-08-27: 150 ug via ORAL
  Filled 2014-08-26 (×2): qty 1

## 2014-08-26 NOTE — Progress Notes (Addendum)
   Subjective:    Patient ID: Gail Cruz, female    DOB: 1961-02-04, 54 y.o.   MRN: [id number]  HPI 54 year old female with DM-2 (diet controlled), hypertension, hyperlipidemia, hypothyroidism, and morbid obesity status post Roux-en-Y gastric bypass (07/13/14) presents today with complaints of nausea and dry heaving.  Patient reports that she feels "like crap".  She's been experiencing significant nausea and vomiting with poor PO intake.  She has intermittently been able to keep food down.  She dates that she mainly "dry heaves" and thus has significantly decreased  PO intake. This is been going on for approximately one month and is been slowly worsening.  She states that she is on a high-protein diet but has not been eating very much at all.  She is also not had much fluid intake (approximately one bottle of water a day).  She feels very weak and fatigued and has little energy.  She endorses compliance with her diet and has been following up closely with nutrition.  She reports she has upcoming appointment with her surgeon on Friday.  Review of Systems Per HPI with the following additions - Patient is passing stool.     Objective:   Physical Exam Filed Vitals:   08/26/14 1008  BP: 119/84  Pulse: 89  Temp: 98.1 F (36.7 C)   Exam: General: Ill-appearing on exam but in no apparent distress at this time. HEENT: NCAT. Dry mucous membranes. Cardiovascular: RRR. No murmurs, rubs, or gallops. Respiratory: CTAB. No rales, rhonchi, or wheeze. Abdomen: soft, nontender, nondistended.    Assessment & Plan:  See Problem List

## 2014-08-26 NOTE — Progress Notes (Signed)
[name] [id number] Code Status: FULL Admission Data: 08/26/2014 11:51 AM Attending Provider:  Mingo Amber VHQ:IONG, Michael Boston, DO Consults/ Treatment Team:    Gail Cruz is a 54 y.o. female patient admitted from ED awake, alert - oriented  X 3 - no acute distress noted.  VSS - Blood pressure 92/50, pulse 88, temperature 98.3 F (36.8 C), temperature source Oral, resp. rate 20, height 5\' 1"  (1.549 m), weight 107.502 kg (237 lb), last menstrual period 07/17/2014, SpO2 98 %.    IV in place, occlusive dsg intact without redness.  Orientation to room, and floor completed with information packet given to patient/family.  Patient declined safety video at this time.  Admission INP armband ID verified with patient/family, and in place.   SR up x 2, fall assessment complete, with patient and family able to verbalize understanding of risk associated with falls, and verbalized understanding to call nsg before up out of bed.  Call light within reach, patient able to voice, and demonstrate understanding.  Skin, clean-dry- intact without evidence of bruising, or skin tears.   No evidence of skin break down noted on exam.     Will cont to eval and treat per MD orders.  Delman Cheadle, RN 08/26/2014 11:51 AM

## 2014-08-26 NOTE — Assessment & Plan Note (Signed)
Patient with significant nausea and vomiting.  Patient is clinically dehydrated. Given that she has been battling this for nearly a month and concern for malnutrition and possible refeeding in the setting of recent Roux-en-Y gastric bypass, will admit for observation.  Planning for IV fluids, nutrition consult, CBC, CMP, TSH, mag, phosphorus.  Surgery also needs to be consulted.

## 2014-08-26 NOTE — Assessment & Plan Note (Signed)
Patient mainly with dry heaving.  She has intermittently been able to keep food down.  I have a low suspicion for obstruction, but given recent surgery it is possible.  Admitting patient to the hospital for IV hydration, labs, nutrition consult.  Will have surgery see her during admission given that this is a postoperative complication.

## 2014-08-26 NOTE — Progress Notes (Signed)
CRITICAL VALUE ALERT  Critical value received: Potassium 2.5  Date of notification:  1/20  Time of notification:  2124  Critical value read back:Yes.    Nurse who received alert:  [place] Dyquan Minks, RN  MD notified (1st page):  Family Medicine on call  Time of first page:  2137  MD notified (2nd page):  Time of second page:  Responding MD: Gerarda Fraction   Time MD responded:  2140  No new orders received. Will admin IV fluids with K and po K.

## 2014-08-26 NOTE — H&P (Signed)
[facility] Admission History and Physical Service Pager: [phone]  Patient name: Gail Cruz Medical record number: [id number] Date of birth: April 23, 1961 Age: 54 y.o. Gender: female  Primary Care Provider: Thersa Salt, DO Consultants: Nutrition Code Status: Full code  Chief Complaint: Dehydration  Assessment and Plan: Gail Cruz is a 54 y.o. female presenting with dehydration . PMH is significant for gastric bypass, hypothyroidism, diabetes mellitus, type 2  #Dehydration: patient with poor PO intake and nausea, most likely contributing. Symptoms all starting after recent gastric bypass surgery and modification of her diet. Diarrhea most likely also contributing. No tachycardia. - NS @150ml /hr - Nutrition consult - Zofran 4mg  q6hrs PRN  #Diarrhea: patient with a somewhat recent admission - obtain C. Diff PCR  #Hypothyroidism: - continue synthroid 12mcg daily  #Hypertension: initially hypotensive to 92 sytolic - hold lisinopril this AM, continue tomorrow as blood pressure allows  #Hyperlipidemia - pravastatin 40mg  daily  FEN/GI: Bariatric diet, NS @150ml /hr Prophylaxis: Heparin subq  Disposition: Admit to inpatient, med-surg, attending, Dr. Mingo Amber  History of Present Illness: Gail Cruz is a 55 y.o. female presenting with weak and dehydrated. Has recent history of gastric bypass in December of 2015. Initially, she was feeling well but then started developing issues with food aversion, nausea and diarrhea (watery, occuring 3-4 times per day). Nausea and diarrhea symptoms have been present for about 2-3 weeks. She has been drinking water (~20 oz) and eating solid foods (chicken, beef, tuna, etc) and has been on a protein-only diet. She has been using Promethazine which has helped with symptoms. No fevers, chills or sweats.  Review Of Systems: Per HPI with the following additions: n/a Otherwise 12 point review of systems was performed and was  unremarkable.  Patient Active Problem List   Diagnosis Date Noted  . S/P gastric bypass 07/27/2014  . Family history of malignant neoplasm of gastrointestinal tract 02/21/2013  . Preventative health care 11/08/2012  . UNSPECIFIED VENOUS INSUFFICIENCY 06/29/2010  . Hyperlipidemia 06/12/2010  . SMOKER 02/10/2010  . Hypothyroidism 10/27/2008  . Morbid obesity with BMI of 50.0-59.9, adult 10/27/2008  . HYPERTENSION, BENIGN ESSENTIAL 10/27/2008  . Diabetes mellitus type II, controlled 10/20/2008   Past Medical History: Past Medical History  Diagnosis Date  . Hypertension   . Hyperlipidemia   . Hypothyroidism   . Headache(784.0)   . Diabetes mellitus without complication   . GERD (gastroesophageal reflux disease)     occasional  . History of hiatal hernia   . Depression 2003  . Claustrophobia    Past Surgical History: Past Surgical History  Procedure Laterality Date  . Cesarean section  1991    at [place]  . Colonoscopy N/A 02/21/2013    Procedure: COLONOSCOPY;  Surgeon: Inda Castle, MD;  Location: [facility] END[facility]COPY;  Service: Endoscopy;  Laterality: N/A;  . Breath tek h pylori N/A 01/14/2014    Procedure: BREATH TEK H PYLORI;  Surgeon: Edward Jolly, MD;  Location: [facility] ENDOSCOPY;  Service: General;  Laterality: N/A;  . Cholecystectomy    . Gastric roux-en-y N/A 07/13/2014    Procedure: LAPAROSCOPIC ROUX-EN-Y GASTRIC BYPASS WITH UPPER ENDOSCOPY,umbilical hernia repair;  Surgeon: Excell Seltzer, MD;  Location: [facility] ORS;  Service: General;  Laterality: N/A;   Social History: History  Substance Use Topics  . Smoking status: Current Some Day Smoker    Types: Cigarettes  . Smokeless tobacco: Never Used     Comment: none in 2 weeks  . Alcohol Use: No   Additional social history:  Previous smoker of 1PPD for 20 years; quit 2 years ago  Please also refer to relevant sections of EMR.  Family History: Family History  Problem Relation Age of Onset  . Colon cancer Father   .  Breast cancer Mother 58    died 11/07/2010   Allergies and Medications: Allergies  Allergen Reactions  . Ciprofloxacin     REACTION: Rash   No current facility-administered medications on file prior to encounter.   Current Outpatient Prescriptions on File Prior to Encounter  Medication Sig Dispense Refill  . acetaminophen (TYLENOL) 500 MG tablet Take 500-1,000 mg by mouth every 6 (six) hours as needed for headache.    . Calcium Carbonate-Vitamin D (CALCIUM 500 + D PO) Take 3 tablets by mouth every morning.     . Cyanocobalamin (VITAMIN B-12) 500 MCG SUBL Place 1 each under the tongue every morning.    Marland Kitchen levothyroxine (SYNTHROID, LEVOTHROID) 100 MCG tablet Take 100 mcg by mouth daily before breakfast.    . lisinopril (PRINIVIL,ZESTRIL) 10 MG tablet Take 1 tablet (10 mg total) by mouth daily. 90 tablet 0  . lovastatin (MEVACOR) 20 MG tablet Take 40 mg by mouth at bedtime.    . Multiple Vitamin (MULTIVITAMIN WITH MINERALS) TABS tablet Take 1 tablet by mouth every morning.      Objective: BP 118/65 mmHg  Pulse 68  Temp(Src) 98.3 F (36.8 C) (Oral)  Resp 18  Ht 5\' 1"  (1.549 m)  Wt 237 lb (107.502 kg)  BMI 44.80 kg/m2  SpO2 100%  LMP 07/17/2014 Exam: General: Fair appearing female, mother at bedside, no distress HEENT: PERRL,  Cardiovascular: Regular rate and rhythm, no murmur Respiratory: Clear to auscultation bilaterally Abdomen: Soft, mild epigastric tenderness, no guarding or rebound Extremities: Legs without pitting edema Skin: Normal skin turgor Neuro: Alert, oriented x3, CN grossly intact  Labs and Imaging: CBC BMET  No results for input(s): WBC, HGB, HCT, PLT in the last 168 hours. No results for input(s): NA, K, CL, CO2, BUN, CREATININE, GLUCOSE, CALCIUM in the last 168 hours.    Cordelia Poche, MD 08/26/2014, 11:26 AM PGY-2, [place] Intern pager: [phone], text pages welcome

## 2014-08-26 NOTE — Progress Notes (Signed)
CRITICAL VALUE ALERT  Critical value received:  K 2.1  Date of notification: 08/26/2014  Time of notification:  1:49 PM  Critical value read back:Yes.    Nurse who received alert:  Merrily Pew  MD notified (1st page):  Family Medicine Teaching Service Pager  Time of first page:  1:50 PM  MD notified (2nd page):  Time of second page: 1:57 PM  Responding MD:  Dr. Gerarda Fraction  Time MD responded: 2:01 PM    Clarification asked if patient should be placed on cardiac monitor. Order from Dr. Gerarda Fraction to place on monitor.

## 2014-08-27 ENCOUNTER — Encounter (HOSPITAL_COMMUNITY): Payer: Self-pay | Admitting: General Surgery

## 2014-08-27 ENCOUNTER — Observation Stay (HOSPITAL_COMMUNITY): Payer: 59

## 2014-08-27 DIAGNOSIS — E039 Hypothyroidism, unspecified: Secondary | ICD-10-CM | POA: Diagnosis present

## 2014-08-27 DIAGNOSIS — I10 Essential (primary) hypertension: Secondary | ICD-10-CM | POA: Diagnosis present

## 2014-08-27 DIAGNOSIS — R109 Unspecified abdominal pain: Secondary | ICD-10-CM | POA: Insufficient documentation

## 2014-08-27 DIAGNOSIS — E669 Obesity, unspecified: Secondary | ICD-10-CM | POA: Diagnosis present

## 2014-08-27 DIAGNOSIS — E876 Hypokalemia: Secondary | ICD-10-CM | POA: Diagnosis present

## 2014-08-27 DIAGNOSIS — E785 Hyperlipidemia, unspecified: Secondary | ICD-10-CM | POA: Diagnosis present

## 2014-08-27 DIAGNOSIS — K429 Umbilical hernia without obstruction or gangrene: Secondary | ICD-10-CM | POA: Diagnosis present

## 2014-08-27 DIAGNOSIS — E119 Type 2 diabetes mellitus without complications: Secondary | ICD-10-CM | POA: Diagnosis present

## 2014-08-27 DIAGNOSIS — Z9884 Bariatric surgery status: Secondary | ICD-10-CM | POA: Diagnosis not present

## 2014-08-27 DIAGNOSIS — Z800 Family history of malignant neoplasm of digestive organs: Secondary | ICD-10-CM | POA: Diagnosis not present

## 2014-08-27 DIAGNOSIS — R1084 Generalized abdominal pain: Secondary | ICD-10-CM

## 2014-08-27 DIAGNOSIS — E860 Dehydration: Secondary | ICD-10-CM | POA: Diagnosis present

## 2014-08-27 DIAGNOSIS — Z6841 Body Mass Index (BMI) 40.0 and over, adult: Secondary | ICD-10-CM | POA: Diagnosis not present

## 2014-08-27 DIAGNOSIS — F17210 Nicotine dependence, cigarettes, uncomplicated: Secondary | ICD-10-CM | POA: Diagnosis present

## 2014-08-27 DIAGNOSIS — Z803 Family history of malignant neoplasm of breast: Secondary | ICD-10-CM | POA: Diagnosis not present

## 2014-08-27 DIAGNOSIS — K219 Gastro-esophageal reflux disease without esophagitis: Secondary | ICD-10-CM | POA: Diagnosis present

## 2014-08-27 LAB — CBC
HCT: 38.4 % (ref 36.0–46.0)
Hemoglobin: 12.9 g/dL (ref 12.0–15.0)
MCH: 27.4 pg (ref 26.0–34.0)
MCHC: 33.6 g/dL (ref 30.0–36.0)
MCV: 81.7 fL (ref 78.0–100.0)
Platelets: 205 10*3/uL (ref 150–400)
RBC: 4.7 MIL/uL (ref 3.87–5.11)
RDW: 15.5 % (ref 11.5–15.5)
WBC: 7.3 10*3/uL (ref 4.0–10.5)

## 2014-08-27 LAB — BASIC METABOLIC PANEL
Anion gap: 10 (ref 5–15)
BUN: 10 mg/dL (ref 6–23)
CHLORIDE: 100 meq/L (ref 96–112)
CO2: 29 mmol/L (ref 19–32)
Calcium: 8.3 mg/dL — ABNORMAL LOW (ref 8.4–10.5)
Creatinine, Ser: 1.1 mg/dL (ref 0.50–1.10)
GFR calc non Af Amer: 57 mL/min — ABNORMAL LOW (ref >90)
GFR, EST AFRICAN AMERICAN: 66 mL/min — AB (ref >90)
Glucose, Bld: 109 mg/dL — ABNORMAL HIGH (ref 70–99)
Potassium: 2.8 mmol/L — ABNORMAL LOW (ref 3.5–5.1)
Sodium: 139 mmol/L (ref 135–145)

## 2014-08-27 LAB — CLOSTRIDIUM DIFFICILE BY PCR: CDIFFPCR: NEGATIVE

## 2014-08-27 MED ORDER — VITAMIN B-1 100 MG PO TABS
100.0000 mg | ORAL_TABLET | Freq: Every day | ORAL | Status: DC
  Administered 2014-08-27 – 2014-08-30 (×4): 100 mg via ORAL
  Filled 2014-08-27 (×4): qty 1

## 2014-08-27 MED ORDER — PRO-STAT SUGAR FREE PO LIQD
30.0000 mL | Freq: Three times a day (TID) | ORAL | Status: DC
  Administered 2014-08-27: 30 mL via ORAL
  Filled 2014-08-27 (×10): qty 30

## 2014-08-27 MED ORDER — LEVOTHYROXINE SODIUM 125 MCG PO TABS
125.0000 ug | ORAL_TABLET | Freq: Every day | ORAL | Status: DC
  Administered 2014-08-28 – 2014-08-30 (×3): 125 ug via ORAL
  Filled 2014-08-27 (×4): qty 1

## 2014-08-27 MED ORDER — FOLIC ACID 1 MG PO TABS
1.0000 mg | ORAL_TABLET | Freq: Every day | ORAL | Status: DC
  Administered 2014-08-27 – 2014-08-30 (×4): 1 mg via ORAL
  Filled 2014-08-27 (×4): qty 1

## 2014-08-27 NOTE — Consult Note (Signed)
Subjective: This is a 54 yo patient known to Dr. Excell Seltzer for recent Roux-en-Y gastric bypass on 07-13-14.  This went well and the patient was doing well initially after surgery.  Several days to a week later, she started developing nausea.  She has been placed on phenergan as an outpatient (zofran is too expensive), which helps some.  She states she sips on water throughout the day, but that's it.  She denies any abdominal pain.  She denies any use of NSAIDs.  She is not smoking.  She was taken off of her protein shakes as these make her nauseated and vomit as well.  The patient was supposed to see Dr. Excell Seltzer tomorrow, but due to dehydration, came to [facility] where she was admitted.  She was found to have a low K at 2.1.  This is currently being replaced.  She did have a 2 view abdominal film that revealed some mildly dilated loops of small bowel in the LUQ, but no other acute findings.  We have been asked to see the patient for further recommendations  Objective: Vital signs in last 24 hours: Temp:  [98.1 F (36.7 C)-98.4 F (36.9 C)] 98.1 F (36.7 C) 09/21/22 0506) Pulse Rate:  [68-71] 71 09/21/2022 0506) Resp:  [17-20] 20 2022-09-21 0506) BP: (101-118)/(46-66) 112/66 mmHg 09/21/2022 0952) SpO2:  [96 %-100 %] 96 % September 21, 2022 0506) Last BM Date: 08/26/14  Intake/Output from previous day: 01/20 0701 - 2022/09/21 0700 In: 2640.7 [P.O.:454; I.V.:1186.7; IV Piggyback:1000] Out: -  Intake/Output this shift: Total I/O In: 695 [P.O.:220; I.V.:475] Out: -   PE: Abd: soft, NT, morbidly obese, +BS, incisions are all well-healed Heart: regular Lungs: CTAB ExT: no cyanosis, clubbing, or edema  Lab Results:   Recent Labs  08/26/14 1250 2014-09-21 0621  WBC 6.4 7.3  HGB 14.8 12.9  HCT 42.8 38.4  PLT 218 205   BMET  Recent Labs  08/26/14 2045 Sep 21, 2014 0621  NA 136 139  K 2.5* 2.8*  CL 96 100  CO2 34* 29  GLUCOSE 107* 109*  BUN 11 10  CREATININE 1.11* 1.10  CALCIUM 8.2* 8.3*   PT/INR No results  for input(s): LABPROT, INR in the last 72 hours. CMP     Component Value Date/Time   NA 139 09-21-14 0621   K 2.8* 09/21/14 0621   CL 100 September 21, 2014 0621   CO2 29 2014/09/21 0621   GLUCOSE 109* 09-21-2014 0621   BUN 10 September 21, 2014 0621   CREATININE 1.10 09/21/2014 0621   CREATININE 0.69 10/29/2013 0903   CALCIUM 8.3* Sep 21, 2014 0621   PROT 7.0 08/26/2014 1250   ALBUMIN 3.4* 08/26/2014 1250   AST 22 08/26/2014 1250   ALT 30 08/26/2014 1250   ALKPHOS 89 08/26/2014 1250   BILITOT 1.2 08/26/2014 1250   GFRNONAA 57* 2014-09-21 0621   GFRNONAA >89 10/29/2013 0903   GFRAA 66* Sep 21, 2014 0621   GFRAA >89 10/29/2013 0903   Lipase  No results found for: LIPASE     Studies/Results: Dg Abd 2 Views  Sep 21, 2014   CLINICAL DATA:  Abdominal pain, generalized. Previous gastric bypass surgery.  EXAM: ABDOMEN - 2 VIEW  COMPARISON:  Upper GI in 07/14/2014  FINDINGS: There is gas within the small and large bowel. No evidence for free air on the decubitus images. Multiple calcifications in the pelvis. Small bowel loops in the left abdomen are mildly dilated with a loop measuring up to 3.6 cm.  IMPRESSION: Gas-filled dilated loops of small bowel in  the left abdomen. Findings can be associated with a partial small bowel obstruction.   Electronically Signed   By: Markus Daft M.D.   On: 08/27/2014 11:27    Anti-infectives: Anti-infectives    None       Assessment/Plan  1. S/p Roux-en Y gastric bypass on 07-13-14 by Dr. Excell Seltzer 2. N/V/D 3. Dehydration 4. Hypokalemia  Plan: 1. I have discussed this patient with Dr. Excell Seltzer and Dr. Redmond Pulling.  We agree with IV hydration as this may help her nausea some.  We would also recommend continuing the multivitamin as well as thiamine and folate.  Her abdominal films do show some slightly dilated small bowel in her LUQ.  Dr. Excell Seltzer has recommended a CT scan to rule out a complication such as internal hernia (which we have low suspicion for given her  symptoms) along with making sure her anastomoses are patent and contrast can pass.  Some patients develop nausea and vomiting after this type of surgery and it can take several months to resolve.  This may be the case and she may just need symptom control, but we need to rule out a post operative issue first.  Thank you for this consultation.  We will follow along with you.  LOS: 1 day    Torian Thoennes E 08/27/2014, 12:31 PM Pager: [phone]

## 2014-08-27 NOTE — Plan of Care (Signed)
Problem: Phase I Progression Outcomes Goal: Voiding-avoid urinary catheter unless indicated Outcome: Progressing Patient due to void. Pt has no distension or urge to void.  Goal: Hemodynamically stable Outcome: Progressing K levels slowing increasing with oral and IV medication orders

## 2014-08-27 NOTE — Progress Notes (Signed)
Pt did not void over night. Bladder scan completed - 17 ml of urine voided. On coming nurse notified - will continue to monitor pt.

## 2014-08-27 NOTE — Progress Notes (Signed)
Family Medicine Teaching Service Daily Progress Note Intern Pager: [phone]  Patient name: Gail Cruz Medical record number: [id number] Date of birth: 04-Dec-1960 Age: 54 y.o. Gender: female  Primary Care Provider: Thersa Salt, DO Consultants: nutrition, Gen Surg Code Status: full  Pt Overview and Major Events to Date:  1/20 - admit to FPTS for dehydration  Assessment and Plan:  Gail Cruz is a 54 y.o. female presenting with dehydration . PMH is significant for gastric bypass, hypothyroidism, diabetes mellitus, type 2  #Dehydration: patient with poor PO intake and nausea, most likely contributing. Symptoms all starting after recent gastric bypass surgery (roux-en-y, 12//7/15) and modification of her diet. Diarrhea most likely also contributing. No tachycardia. - IV hydration - Nutrition consult - Zofran 4mg  q6hrs PRN - Will consult Gen surg today   #Diarrhea: patient with a somewhat recent admission. No BM since admission. - obtain C. Diff PCR  #Hypokalemia: K 2.1 on admission. K 2.8 this AM - s/p 60mEq of KDur - Continue KDur 50mEq BID - Continue to monitor - KCl in IVF  #Hypothyroidism: TSH 21.917 on admission - continue synthroid 123mcg daily (was previously on 150mcg) - repeat TSH as OP  #Hypertension: initially hypotensive to 92 sytolic. Currently normotensive. - hold lisinopril this AM, continue tomorrow as blood pressure allows  #Hyperlipidemia - pravastatin 40mg  daily  FEN/GI: Bariatric diet, D5NS +KCl @100ml /hr Prophylaxis: Heparin subq  Disposition: Pending clinical improvement and Gen Surg recs  Subjective:  Has abd pain this AM, feels like gas pain. Feels about the same overall.  Objective: Temp:  [98.1 F (36.7 C)-98.4 F (36.9 C)] 98.1 F (36.7 C) (01/21 0506) Pulse Rate:  [68-89] 71 (01/21 0506) Resp:  [17-20] 20 (01/21 0506) BP: (92-119)/(46-84) 117/46 mmHg (01/21 0506) SpO2:  [96 %-100 %] 96 % (01/21 0506) Weight:  [237 lb (107.502 kg)-237  lb 12.8 oz (107.865 kg)] 237 lb (107.502 kg) (01/20 1100) Physical Exam: General: Fair appearing female, mother at bedside, no distress Cardiovascular: Regular rate and rhythm, no murmur Respiratory: Clear to auscultation bilaterally Abdomen: Soft, mild epigastric, LUQ, LLQ tenderness, no guarding or rebound Extremities: Legs without pitting edema Skin: Normal skin turgor Neuro: Alert, oriented x3, CN grossly intact  Laboratory:  Recent Labs Lab 08/26/14 1250  WBC 6.4  HGB 14.8  HCT 42.8  PLT 218    Recent Labs Lab 08/26/14 1250 08/26/14 2045  NA 136 136  K 2.1* 2.5*  CL 92* 96  CO2 29 34*  BUN 12 11  CREATININE 1.21* 1.11*  CALCIUM 9.0 8.2*  PROT 7.0  --   BILITOT 1.2  --   ALKPHOS 89  --   ALT 30  --   AST 22  --   GLUCOSE 107* 107*    Imaging/Diagnostic Tests: none  Lavon Paganini, MD 08/27/2014, 7:26 AM PGY-1, [place] Intern pager: [phone], text pages welcome

## 2014-08-27 NOTE — Progress Notes (Signed)
INITIAL NUTRITION ASSESSMENT  DOCUMENTATION CODES Per approved criteria  -Morbid Obesity   INTERVENTION: - Prostat TID, each supplement provides 100 kcal and 15 g protein.  - Recommend addition of 2 chewable multivitamins per day with 1500 mg calcium citrate (chewable) and 350-500 mcg sublingual B12. - RD provided pt with education regarding a bariatric diet focusing on protein and liquids and avoiding sugary foods and beverages, high fat foods, and caffeine. Stressed importance of compliance with bariatric vitamin regimen. - RD will continue to monitor.  NUTRITION DIAGNOSIS: Inadequate oral intake related to nausea and vomiting as evidenced by rapid wt loss and reported poor po intake.   Goal: Pt to meet >/= 90% of their estimated nutrition needs  Monitor:  Weight trend, acceptance of supplements, level of knowledge, and labs  Reason for Assessment: Consult for pt s/p gastric by-pass  54 y.o. female  Admitting Dx: <principal problem not specified>  ASSESSMENT: 54 y.o. female presenting with dehydration . PMH is significant for gastric bypass, hypothyroidism, diabetes mellitus, type 2  - Spoke with pt and mother regarding recent gastric by-pass surgery.  - Pt has lost 35 lbs in the past 2 months, and 66 lbs in the past year.  - Pt has been non-compliant with post bariatric surgery instructions due to nausea and vomiting. She has not been taking her vitamins or drinking protein shakes. Pt reports eating pizza rolls and pepperoni. She reports episodes of severe diarrhea (dumping syndrome?).  - Pt educated on importance of focusing on high protein foods and fluids. She was encouraged to re-start her vitamin regimen once at home. Pt given ideas on ways to improve fluid and protein intake. Encouraged to stay away from sugary foods and beverages and high-fat and fried foods. Pt given ideas on non-carbonated, non-caffeinated, sugar-free beverages.  - No signs of fat or muscle wasting.    Labs: K low CBGs 115-191  Height: Ht Readings from Last 1 Encounters:  08/26/14 5\' 1"  (1.549 m)    Weight: Wt Readings from Last 1 Encounters:  08/26/14 237 lb (107.502 kg)    Ideal Body Weight: 47.8 kg  % Ideal Body Weight: 225%  Wt Readings from Last 10 Encounters:  08/26/14 237 lb (107.502 kg)  08/26/14 237 lb 12.8 oz (107.865 kg)  07/28/14 254 lb (115.214 kg)  07/27/14 258 lb 12.8 oz (117.391 kg)  07/13/14 267 lb 6 oz (121.281 kg)  06/30/14 275 lb (124.739 kg)  03/11/14 292 lb 9.6 oz (132.722 kg)  12/23/13 294 lb 12.8 oz (133.72 kg)  10/29/13 303 lb 1.6 oz (137.485 kg)  03/12/13 284 lb (128.822 kg)   BMI:  Body mass index is 44.8 kg/(m^2).  Estimated Nutritional Needs: Kcal: 1400-1600 Protein: 60-80 g Fluid: >1.6 L/day  Skin: Intact  Diet Order: Diet bariatric full liquid  EDUCATION NEEDS: -Education needs addressed   Intake/Output Summary (Last 24 hours) at 08/27/14 1531 Last data filed at 08/27/14 1452  Gross per 24 hour  Intake   2959 ml  Output      0 ml  Net   2959 ml    Last BM: 1/21   Labs:   Recent Labs Lab 08/26/14 1250 08/26/14 2045 08/27/14 0621  NA 136 136 139  K 2.1* 2.5* 2.8*  CL 92* 96 100  CO2 29 34* 29  BUN 12 11 10   CREATININE 1.21* 1.11* 1.10  CALCIUM 9.0 8.2* 8.3*  MG 1.7  --   --   PHOS 2.9  --   --  GLUCOSE 107* 107* 109*    CBG (last 3)  No results for input(s): GLUCAP in the last 72 hours.  Scheduled Meds: . vitamin B-12  500 mcg Oral Daily  . folic acid  1 mg Oral Daily  . heparin  5,000 Units Subcutaneous 3 times per day  . [START ON 08/28/2014] levothyroxine  125 mcg Oral QAC breakfast  . lisinopril  10 mg Oral Daily  . multivitamin with minerals  1 tablet Oral q morning - 10a  . potassium chloride  40 mEq Oral BID  . pravastatin  40 mg Oral q1800  . thiamine  100 mg Oral Daily    Continuous Infusions: . dextrose 5 % and 0.9 % NaCl with KCl 40 mEq/L 100 mL/hr at 08/27/14 4982    Past  Medical History  Diagnosis Date  . Hypertension   . Hyperlipidemia   . Hypothyroidism   . Headache(784.0)   . GERD (gastroesophageal reflux disease)     occasional  . History of hiatal hernia   . Depression 2003  . Claustrophobia   . Diabetes mellitus without complication     Past Surgical History  Procedure Laterality Date  . Cesarean section  1991    at [place]  . Colonoscopy N/A 02/21/2013    Procedure: COLONOSCOPY;  Surgeon: Inda Castle, MD;  Location: [facility] ENDOSCOPY;  Service: Endoscopy;  Laterality: N/A;  . Breath tek h pylori N/A 01/14/2014    Procedure: BREATH TEK H PYLORI;  Surgeon: Edward Jolly, MD;  Location: [facility] ENDOSCOPY;  Service: General;  Laterality: N/A;  . Cholecystectomy    . Gastric roux-en-y N/A 07/13/2014    Procedure: LAPAROSCOPIC ROUX-EN-Y GASTRIC BYPASS WITH UPPER ENDOSCOPY,umbilical hernia repair;  Surgeon: Excell Seltzer, MD;  Location: [facility] ORS;  Service: General;  Laterality: N/A;    Laurette Schimke MS, RD, LDN

## 2014-08-28 ENCOUNTER — Inpatient Hospital Stay (HOSPITAL_COMMUNITY): Payer: 59

## 2014-08-28 ENCOUNTER — Encounter (HOSPITAL_COMMUNITY): Payer: Self-pay

## 2014-08-28 DIAGNOSIS — R112 Nausea with vomiting, unspecified: Secondary | ICD-10-CM | POA: Insufficient documentation

## 2014-08-28 DIAGNOSIS — E876 Hypokalemia: Secondary | ICD-10-CM

## 2014-08-28 LAB — CBC
HCT: 35.3 % — ABNORMAL LOW (ref 36.0–46.0)
HEMOGLOBIN: 11.4 g/dL — AB (ref 12.0–15.0)
MCH: 27.4 pg (ref 26.0–34.0)
MCHC: 32.3 g/dL (ref 30.0–36.0)
MCV: 84.9 fL (ref 78.0–100.0)
Platelets: 162 10*3/uL (ref 150–400)
RBC: 4.16 MIL/uL (ref 3.87–5.11)
RDW: 16 % — AB (ref 11.5–15.5)
WBC: 6.7 10*3/uL (ref 4.0–10.5)

## 2014-08-28 LAB — BASIC METABOLIC PANEL
ANION GAP: 5 (ref 5–15)
BUN: 9 mg/dL (ref 6–23)
CALCIUM: 8.3 mg/dL — AB (ref 8.4–10.5)
CO2: 28 mmol/L (ref 19–32)
CREATININE: 1.15 mg/dL — AB (ref 0.50–1.10)
Chloride: 106 mEq/L (ref 96–112)
GFR, EST AFRICAN AMERICAN: 63 mL/min — AB (ref >90)
GFR, EST NON AFRICAN AMERICAN: 54 mL/min — AB (ref >90)
GLUCOSE: 96 mg/dL (ref 70–99)
Potassium: 2.9 mmol/L — ABNORMAL LOW (ref 3.5–5.1)
Sodium: 139 mmol/L (ref 135–145)

## 2014-08-28 MED ORDER — POTASSIUM CHLORIDE CRYS ER 20 MEQ PO TBCR
40.0000 meq | EXTENDED_RELEASE_TABLET | Freq: Once | ORAL | Status: AC
  Administered 2014-08-28: 40 meq via ORAL

## 2014-08-28 MED ORDER — PROMETHAZINE HCL 12.5 MG PO TABS
6.2500 mg | ORAL_TABLET | Freq: Three times a day (TID) | ORAL | Status: DC
  Administered 2014-08-28 – 2014-08-29 (×3): 6.25 mg via ORAL
  Filled 2014-08-28 (×6): qty 1

## 2014-08-28 MED ORDER — PANTOPRAZOLE SODIUM 40 MG PO TBEC
40.0000 mg | DELAYED_RELEASE_TABLET | Freq: Every day | ORAL | Status: DC
  Administered 2014-08-28 – 2014-08-29 (×2): 40 mg via ORAL
  Filled 2014-08-28 (×2): qty 1

## 2014-08-28 MED ORDER — IOHEXOL 300 MG/ML  SOLN
25.0000 mL | INTRAMUSCULAR | Status: AC
  Administered 2014-08-28: 25 mL via ORAL

## 2014-08-28 MED ORDER — IOHEXOL 300 MG/ML  SOLN
100.0000 mL | Freq: Once | INTRAMUSCULAR | Status: AC | PRN
  Administered 2014-08-28: 100 mL via INTRAVENOUS

## 2014-08-28 NOTE — Progress Notes (Signed)
Family Medicine Teaching Service Daily Progress Note Intern Pager: [phone]  Patient name: Gail Cruz Medical record number: [id number] Date of birth: [birth year] Age: 54 y.o. Gender: female  Primary Care Provider: Thersa Salt, DO Consultants: nutrition, Gen Surg Code Status: full  Pt Overview and Major Events to Date:  1/20 - admit to FPTS for dehydration  Assessment and Plan:  Gail Cruz is a 54 y.o. female presenting with dehydration . PMH is significant for gastric bypass, hypothyroidism, diabetes mellitus, type 2  #Dehydration: patient with poor PO intake and nausea, most likely contributing. Symptoms all starting after recent gastric bypass surgery (roux-en-y, 12//7/15) and modification of her diet. Diarrhea most likely also contributing. No tachycardia. - IV hydration - Nutrition consult - Zofran 4mg  q6hrs PRN - gen surg following, obtained CT abd pelvis w/ contrast which was relatively unremarkable (hernia containing fat, no indications for acute intervention at this time). Continue IVF, rec zofran before PO intake (consider low dose phenergan if unable to afford zofran) - as pt unable to afford zofran as outpt, go ahead and start low dose phenergan PO (6.25mg ) prior to meals  #Diarrhea: C diff negative. 2 episodes runny stools yesterday. - continue to monitor, replete electrolytes prn - consider loperamide?  #Hypokalemia: K 2.1 on admission. K 2.9 this AM, despite 26meq PO BID - Continue KDur 39mEq BID, give extra dose of 69meq PO once this morning - Continue to monitor, may need to increase standing potassium order, with caution - KCl in IVF - repeat BMET in AM  #Hypothyroidism: TSH 21.917 on admission - continue synthroid 127mcg daily (was previously on 123mcg) - repeat TSH as OP  #Hypertension: initially hypotensive to 92 sytolic. Currently still with soft BP's in systolics of 71'G - hold lisinopril this AM, continue tomorrow as blood pressure  allows  #Hyperlipidemia - pravastatin 40mg  daily  FEN/GI: Bariatric diet, D5NS +KCl @100ml /hr Prophylaxis: Heparin subq  Disposition: Pending clinical improvement  Subjective:  Feeling okay today. No specific complaints. Tolerating PO clears, planning for tilapia at lunchtime. States she is completely unable to afford zofran as the copay is $97. Phenergan is much more affordable for her ($5 copay).  Objective: Temp:  [97.6 F (36.4 C)] 97.6 F (36.4 C) (01/22 0455) Pulse Rate:  [65-76] 76 (01/22 0900) Resp:  [16-19] 19 (01/22 0455) BP: (93-101)/(52-64) 97/56 mmHg (01/22 0900) SpO2:  [96 %-100 %] 100 % (01/22 0455) Physical Exam: General: well appearing female, no distress Cardiovascular: Regular rate and rhythm, no murmur Respiratory: Clear to auscultation bilaterally Abdomen: Soft, nontender to palpation. NABS. Extremities: Legs without pitting edema Skin: Normal skin turgor Neuro: Alert, oriented x3, speech normal, no gross deficits  Laboratory:  Recent Labs Lab 08/26/14 1250 08/27/14 0621 08/28/14 0543  WBC 6.4 7.3 6.7  HGB 14.8 12.9 11.4*  HCT 42.8 38.4 35.3*  PLT 218 205 162    Recent Labs Lab 08/26/14 1250 08/26/14 2045 08/27/14 0621 08/28/14 0543  NA 136 136 139 139  K 2.1* 2.5* 2.8* 2.9*  CL 92* 96 100 106  CO2 29 34* 29 28  BUN 12 11 10 9   CREATININE 1.21* 1.11* 1.10 1.15*  CALCIUM 9.0 8.2* 8.3* 8.3*  PROT 7.0  --   --   --   BILITOT 1.2  --   --   --   ALKPHOS 89  --   --   --   ALT 30  --   --   --   AST 22  --   --   --  GLUCOSE 107* 107* 109* 96    Imaging/Diagnostic Tests: CT Abd Pelvis W contrast 1/22 1. Diffuse colonic fluid levels suggesting diarrheal illness or less likely mild ileus. 2. No evidence of postoperative abscess or anastomotic breakdown. 3. Large infraumbilical hernia with new congestion of the herniated fat.  Leeanne Rio, MD 08/28/2014, 11:32 AM PGY-3, [place] Intern pager:  [phone], text pages welcome

## 2014-08-28 NOTE — Progress Notes (Signed)
Subjective: Less nausea. No pain. Wants more solids  Objective: Vital signs in last 24 hours: Temp:  [97.6 F (36.4 C)] 97.6 F (36.4 C) (01/22 0455) Pulse Rate:  [65-76] 65 (01/22 0455) Resp:  [16-19] 19 (01/22 0455) BP: (93-112)/(52-66) 93/64 mmHg (01/22 0455) SpO2:  [96 %-100 %] 100 % (01/22 0455) Last BM Date: 08/27/14  Intake/Output from previous day: 01/21 0701 - 01/22 0700 In: 1912.7 [P.O.:696; I.V.:1216.7] Out: -  Intake/Output this shift:    Alert, nad Obese, soft, nd  Lab Results:   Recent Labs  08/27/14 0621 08/28/14 0543  WBC 7.3 6.7  HGB 12.9 11.4*  HCT 38.4 35.3*  PLT 205 162   BMET  Recent Labs  08/27/14 0621 08/28/14 0543  NA 139 139  K 2.8* 2.9*  CL 100 106  CO2 29 28  GLUCOSE 109* 96  BUN 10 9  CREATININE 1.10 1.15*  CALCIUM 8.3* 8.3*   PT/INR No results for input(s): LABPROT, INR in the last 72 hours. ABG No results for input(s): PHART, HCO3 in the last 72 hours.  Invalid input(s): PCO2, PO2  Studies/Results: Ct Abdomen Pelvis W Contrast  08/28/2014   CLINICAL DATA:  Nausea and vomiting after Roux-en-Y bypass 07/13/2014.  EXAM: CT ABDOMEN AND PELVIS WITH CONTRAST  TECHNIQUE: Multidetector CT imaging of the abdomen and pelvis was performed using the standard protocol following bolus administration of intravenous contrast.  CONTRAST:  182mL OMNIPAQUE IOHEXOL 300 MG/ML  SOLN  COMPARISON:  12/26/2013  FINDINGS: BODY WALL: There is a small fatty umbilical hernia which is unchanged. Neighboring fat infiltration is likely from laparoscopic access. A relatively narrowed necked infraumbilical midline hernia is similar in size, but has new fat infiltration. The infiltrated fat measures 13 cm.  LOWER CHEST: Subsegmental atelectasis at the right base.  ABDOMEN/PELVIS:  Liver: Fatty infiltration of the liver without focal abnormality.  Biliary: Cholecystectomy.  Pancreas: Unremarkable.  Spleen: Unremarkable.  Adrenals: Unremarkable.  Kidneys and  ureters: No hydronephrosis or stone.  Bladder: Unremarkable.  Reproductive: Unremarkable.  Bowel: Status post interval gastric bypass. There is no evidence of anastomotic breakdown or mechanical obstruction. The colon is diffusely fluid-filled with fluid levels reaching to the rectum. Oral contrast has reached the distal transverse colon suggesting this is a diarrheal illness over a ileus. No small bowel dilatation. No contrast within the excluded stomach. There is no complicating abscess. Normal appendix.  Retroperitoneum: No mass or adenopathy.  Peritoneum: No ascites or pneumoperitoneum.  Vascular: No acute abnormality.  OSSEOUS: No acute abnormalities.  IMPRESSION: 1. Diffuse colonic fluid levels suggesting diarrheal illness or less likely mild ileus. 2. No evidence of postoperative abscess or anastomotic breakdown. 3. Large infraumbilical hernia with new congestion of the herniated fat.   Electronically Signed   By: Jorje Guild M.D.   On: 08/28/2014 07:09   Dg Abd 2 Views  08/27/2014   CLINICAL DATA:  Abdominal pain, generalized. Previous gastric bypass surgery.  EXAM: ABDOMEN - 2 VIEW  COMPARISON:  Upper GI in 07/14/2014  FINDINGS: There is gas within the small and large bowel. No evidence for free air on the decubitus images. Multiple calcifications in the pelvis. Small bowel loops in the left abdomen are mildly dilated with a loop measuring up to 3.6 cm.  IMPRESSION: Gas-filled dilated loops of small bowel in the left abdomen. Findings can be associated with a partial small bowel obstruction.   Electronically Signed   By: Markus Daft M.D.   On: 08/27/2014 11:27  Anti-infectives: Anti-infectives    None      Assessment/Plan: Acute kidney injury - resolved Hypokalemia Dehydration Nausea S/p LRYGB 55/9741 Umbilical hernia with omentum  Ct wnl. Has umbilical hernia with omentum - nothing to do about now. No internal hernia. Contrast in colon. Seems to have this phenomena we see  occasionally with nausea with PO intake.  She can have bariatric advanced diet. I rediscussed proper eating techniques.  Occasionally, these pts have to premedicate with zofran prior to po intake.   She apparently didn't have money/funding for zofran. Would rec SW consult to see if she can get medication assistance. Would try to avoid phenergan if possible since it may make lethargic. But if zofran not an option would do low dose phenergan.   Primary team to address electrolyte Stress importance to pt of taking recommended supplements.    Vitamins and Minerals . Start 1 day after surgery unless otherwise directed by your surgeon . 2 Chewable Multivitamin / Multimineral Supplement with iron (i.e. Centrum for Adults) . Vitamin B-12, 350-500 micrograms sub-lingual (place tablet under the tongue) each day . Chewable Calcium Citrate with Vitamin D-3 (Example: 3 Chewable Calcium  Plus 600 with Vitamin D-3) o Take 500 mg three (3) times a day for a total of 1500 mg each day o Do not take all 3 doses of calcium at one time as it may cause constipation, and you can only absorb 500 mg at a time o Do not mix multivitamins containing iron with calcium supplements;  take 2 hours apart o Do not substitute Tums (calcium carbonate) for your calcium . Menstruating women and those at risk for anemia ( a blood disease that causes weakness) may need extra iron o Talk to your doctor to see if you need more iron . If you need extra iron: Total daily Iron recommendation (including Vitamins) is 50 to 100 mg Iron/day . Do not stop taking or change any vitamins or minerals until you talk to your nutritionist or surgeon . Your nutritionist and/or surgeon must approve all vitamin and mineral supplements     Gail Cruz. Redmond Pulling, MD, FACS General, Bariatric, & Minimally Invasive Surgery [facility], [place]   LOS: 2 days    Gayland Curry 08/28/2014

## 2014-08-28 NOTE — Progress Notes (Signed)
Pt refused to drink last cup of contrast. Zofran was given before the first cup on contrast was administered. RN notified CT.

## 2014-08-28 NOTE — Discharge Summary (Signed)
[facility] Discharge Summary  Patient name: Gail Cruz Medical record number: [id number] Date of birth: July 08, 1961 Age: 54 y.o. Gender: female Date of Admission: 08/26/2014  Date of Discharge: 08/30/2014  Admitting Physician: Alveda Reasons, MD  Primary Care Provider: Thersa Salt, DO Consultants: Gen surg  Indication for Hospitalization: dehydration  Discharge Diagnoses/Problem List:  Dehydration Diarrhea Hypokalemia Hypothyroidism Hypertension Hyperlipidemia   Disposition: home  Discharge Condition: stable  Discharge Exam: See progress note from day of discharge  Brief Hospital Course: Gail Cruz is a 55 y.o. female presenting with dehydration . PMH is significant for gastric bypass, hypothyroidism, diabetes mellitus, type 2  #Dehydration: patient with poor PO intake, nausea, and diarrhea prior to admission, most likely contributing. Symptoms all starting after recent gastric bypass surgery (roux-en-y, 12//7/15) and modification of her diet. Patient admitted for IV hydration. Given Zofran 4mg  q6h prn for nausea (switched to phenergan prior to discharge for affordability). Gen surgery was consulted after admission to evaluate for possible post-op complications.  CT abd/pelvis with contrast was relatively unremarkable (hernia containing fat, no indications for acute intervention at this time). Patient ambulating and tolerating PO intake without emesis prior to discharge.  #Diarrhea: Patient reporting few days of diarrhea prior to admission. C diff negative. Resolved prior to discharge.  #Hypokalemia: K 2.1 on admission. Required multiple doses of KDur, and then K suddenly up to 5-5.4.  No further supplementation given.  #Hypothyroidism: TSH 21.917 on admission. Home synthroid 138mcg daily increased to 156mcg daily.  #Hypertension: Hypotensive to 47M systolic throughout admission.  Home lisinopril discontinued.  All other chronic medical  conditions stable throughout admission and managed with home regimens.  Issues for Follow Up:  - f/u nausea and PO intake - Repeat BMET - f/u K - f/u BP - Repeat TSH in 6 weeks   Significant Procedures: none  Significant Labs and Imaging:   Recent Labs Lab 08/26/14 1250 08/27/14 0621 08/28/14 0543  WBC 6.4 7.3 6.7  HGB 14.8 12.9 11.4*  HCT 42.8 38.4 35.3*  PLT 218 205 162    Recent Labs Lab 08/26/14 1250  08/27/14 0621 08/28/14 0543 08/29/14 0436 08/29/14 1651 08/30/14 0500  NA 136  < > 139 139 137 138 137  K 2.1*  < > 2.8* 2.9* 5.0 5.4* 5.0  CL 92*  < > 100 106 112 110 109  CO2 29  < > 29 28 19 25 23   GLUCOSE 107*  < > 109* 96 84 79 80  BUN 12  < > 10 9 9 10 14   CREATININE 1.21*  < > 1.10 1.15* 1.08 1.00 1.03  CALCIUM 9.0  < > 8.3* 8.3* 8.3* 8.5 8.1*  MG 1.7  --   --   --   --   --   --   PHOS 2.9  --   --   --   --   --   --   ALKPHOS 89  --   --   --   --   --   --   AST 22  --   --   --   --   --   --   ALT 30  --   --   --   --   --   --   ALBUMIN 3.4*  --   --   --   --   --   --   < > = values in this interval not displayed.   TSH  21.917  CT Abd Pelvis W contrast 1/22 1. Diffuse colonic fluid levels suggesting diarrheal illness or less likely mild ileus. 2. No evidence of postoperative abscess or anastomotic breakdown. 3. Large infraumbilical hernia with new congestion of the herniated fat.  Results/Tests Pending at Time of Discharge: none  Discharge Medications:    Medication List    STOP taking these medications        lisinopril 10 MG tablet  Commonly known as:  PRINIVIL,ZESTRIL      TAKE these medications        acetaminophen 500 MG tablet  Commonly known as:  TYLENOL  Take 500-1,000 mg by mouth every 6 (six) hours as needed for headache.     cyanocobalamin 500 MCG tablet  Take 1 tablet (500 mcg total) by mouth daily.     folic acid 1 MG tablet  Commonly known as:  FOLVITE  Take 1 tablet (1 mg total) by mouth daily.      levothyroxine 125 MCG tablet  Commonly known as:  SYNTHROID, LEVOTHROID  Take 1 tablet (125 mcg total) by mouth daily before breakfast.     lovastatin 20 MG tablet  Commonly known as:  MEVACOR  Take 40 mg by mouth at bedtime.     multivitamin tablet  Take 1 tablet by mouth daily.     pantoprazole 40 MG tablet  Commonly known as:  PROTONIX  Take 1 tablet (40 mg total) by mouth daily.     promethazine 12.5 MG tablet  Commonly known as:  PHENERGAN  Take 0.5 tablets (6.25 mg total) by mouth every 8 (eight) hours as needed for nausea or vomiting.     thiamine 100 MG tablet  Take 1 tablet (100 mg total) by mouth daily.        Discharge Instructions: Please refer to Patient Instructions section of EMR for full details.  Patient was counseled important signs and symptoms that should prompt return to medical care, changes in medications, dietary instructions, activity restrictions, and follow up appointments.   Follow-Up Appointments: Follow-up Information    Follow up with HOXWORTH,BENJAMIN T, MD In 1 week.   Specialty:  General Surgery   Contact information:   [place] [address] [phone]       Follow up with Chrisandra Netters, MD On 09/01/2014.   Specialty:  Family Medicine   Why:  at 2:30pm for hospital follow up appointment   Contact information:   [place] [address] [phone]       Lavon Paganini, MD 08/30/2014, 3:46 PM PGY-1, [place]

## 2014-08-29 LAB — BASIC METABOLIC PANEL
ANION GAP: 3 — AB (ref 5–15)
Anion gap: 6 (ref 5–15)
BUN: 9 mg/dL (ref 6–23)
BUN: 10 mg/dL (ref 6–23)
CHLORIDE: 112 mmol/L (ref 96–112)
CO2: 19 mmol/L (ref 19–32)
CO2: 25 mmol/L (ref 19–32)
CREATININE: 1.08 mg/dL (ref 0.50–1.10)
Calcium: 8.3 mg/dL — ABNORMAL LOW (ref 8.4–10.5)
Calcium: 8.5 mg/dL (ref 8.4–10.5)
Chloride: 110 mmol/L (ref 96–112)
Creatinine, Ser: 1 mg/dL (ref 0.50–1.10)
GFR calc Af Amer: 68 mL/min — ABNORMAL LOW (ref >90)
GFR calc non Af Amer: 64 mL/min — ABNORMAL LOW (ref >90)
GFR, EST AFRICAN AMERICAN: 74 mL/min — AB (ref >90)
GFR, EST NON AFRICAN AMERICAN: 58 mL/min — AB (ref >90)
GLUCOSE: 79 mg/dL (ref 70–99)
Glucose, Bld: 84 mg/dL (ref 70–99)
POTASSIUM: 5.4 mmol/L — AB (ref 3.5–5.1)
Potassium: 5 mmol/L (ref 3.5–5.1)
SODIUM: 137 mmol/L (ref 135–145)
Sodium: 138 mmol/L (ref 135–145)

## 2014-08-29 MED ORDER — SODIUM CHLORIDE 0.9 % IV BOLUS (SEPSIS)
500.0000 mL | Freq: Once | INTRAVENOUS | Status: AC
  Administered 2014-08-29: 500 mL via INTRAVENOUS

## 2014-08-29 NOTE — Progress Notes (Signed)
Family Medicine Teaching Service Daily Progress Note Intern Pager: [phone]  Patient name: Gail Cruz Medical record number: [id number] Date of birth: May 03, 1961 Age: 54 y.o. Gender: female  Primary Care Provider: Thersa Salt, DO Consultants: nutrition, Gen Surg Code Status: full  Pt Overview and Major Events to Date:  1/20 - admit to FPTS for dehydration  Assessment and Plan:  Gail Cruz is a 54 y.o. female presenting with dehydration . PMH is significant for gastric bypass, hypothyroidism, diabetes mellitus, type 2.  #Dehydration: patient with poor PO intake and nausea, most likely contributing. Symptoms all starting after recent gastric bypass surgery (roux-en-y, 12//7/15) and modification of her diet. Diarrhea most likely also contributing. No tachycardia. - Nutrition consulted, appreciate recs - Zofran 4mg  q6hrs PRN - gen surg following, obtained CT abd pelvis w/ contrast which was relatively unremarkable (hernia containing fat, no indications for acute intervention at this time). Continue IVF, rec zofran or low dose phenergan before PO intake - did well with scheduled 6.25mg  phenergan prior to meals. Will d/c this today and see how pt does without antiemetic - SLIV - encourage OOB  #Diarrhea: C diff negative. 2 episodes runny stools yesterday. - continue to monitor, replete electrolytes prn - consider loperamide?  #Hypokalemia: K 2.1 on admission. Has required many doses Kdur. K 5.0 this morning, question if this is accurate.  - d/c K supplementation (was getting PO and in IVF) - repeat BMET in PM - SLIV  #Hypothyroidism: TSH 21.917 on admission - continue synthroid 176mcg daily (was previously on 158mcg) - repeat TSH as OP  #Hypertension: initially hypotensive to 92 sytolic. Currently still with soft BP's in systolics of 17'E - hold lisinopril this AM, restart as blood pressure allows  #Hyperlipidemia - pravastatin 40mg  daily  FEN/GI: Bariatric diet,  SLIV Prophylaxis: Heparin subq  Disposition: Pending clinical improvement. Anticipate d/c to home tomorrow.  Subjective:  No complaints. Loose stools x 2 in last 24 hrs. Tolerated phenergan before meals. Feels mildly queasy this morning.  Objective: Temp:  [97.4 F (36.3 C)-98 F (36.7 C)] 98 F (36.7 C) (01/23 0610) Pulse Rate:  [73-85] 85 (01/23 0610) Resp:  [18-19] 19 (01/22 2123) BP: (64-115)/(50-80) 101/66 mmHg (01/23 0610) SpO2:  [98 %-100 %] 99 % (01/23 0610) Physical Exam: General: well appearing female, no distress, lying in bed Cardiovascular: Regular rate and rhythm, no murmur Respiratory: Clear to auscultation bilaterally, NWOB Abdomen: Soft, nontender to palpation. NABS. Extremities: no calf tenderness bilat Skin: Normal skin turgor Neuro: Alert, oriented x3, speech normal, no gross deficits  Laboratory:  Recent Labs Lab 08/26/14 1250 08/27/14 0621 08/28/14 0543  WBC 6.4 7.3 6.7  HGB 14.8 12.9 11.4*  HCT 42.8 38.4 35.3*  PLT 218 205 162    Recent Labs Lab 08/26/14 1250  08/27/14 0621 08/28/14 0543 08/29/14 0436  NA 136  < > 139 139 137  K 2.1*  < > 2.8* 2.9* 5.0  CL 92*  < > 100 106 112  CO2 29  < > 29 28 19   BUN 12  < > 10 9 9   CREATININE 1.21*  < > 1.10 1.15* 1.08  CALCIUM 9.0  < > 8.3* 8.3* 8.3*  PROT 7.0  --   --   --   --   BILITOT 1.2  --   --   --   --   ALKPHOS 89  --   --   --   --   ALT 30  --   --   --   --  AST 22  --   --   --   --   GLUCOSE 107*  < > 109* 96 84  < > = values in this interval not displayed.  Imaging/Diagnostic Tests: CT Abd Pelvis W contrast 1/22 1. Diffuse colonic fluid levels suggesting diarrheal illness or less likely mild ileus. 2. No evidence of postoperative abscess or anastomotic breakdown. 3. Large infraumbilical hernia with new congestion of the herniated fat.  Leeanne Rio, MD 08/29/2014, 9:47 AM PGY-3, [place] Intern pager: [phone], text pages welcome

## 2014-08-29 NOTE — Progress Notes (Signed)
Subjective: Feels better, did well with liquids, no complaints, doesn't know if she can get home today  Objective: Vital signs in last 24 hours: Temp:  [97.4 F (36.3 C)-98 F (36.7 C)] 98 F (36.7 C) (01/23 0610) Pulse Rate:  [73-85] 85 (01/23 0610) Resp:  [18-19] 19 (01/22 2123) BP: (64-115)/(50-80) 101/66 mmHg (01/23 0610) SpO2:  [98 %-100 %] 99 % (01/23 0610) Last BM Date: 08/28/14  Intake/Output from previous day: 01/22 0701 - 01/23 0700 In: 1191.7 [I.V.:1191.7] Out: -  Intake/Output this shift:    GI: soft nt nd incisions healed  Lab Results:   Recent Labs  08/27/14 0621 08/28/14 0543  WBC 7.3 6.7  HGB 12.9 11.4*  HCT 38.4 35.3*  PLT 205 162   BMET  Recent Labs  08/28/14 0543 08/29/14 0436  NA 139 137  K 2.9* 5.0  CL 106 112  CO2 28 19  GLUCOSE 96 84  BUN 9 9  CREATININE 1.15* 1.08  CALCIUM 8.3* 8.3*   PT/INR No results for input(s): LABPROT, INR in the last 72 hours. ABG No results for input(s): PHART, HCO3 in the last 72 hours.  Invalid input(s): PCO2, PO2  Studies/Results: Ct Abdomen Pelvis W Contrast  08/28/2014   CLINICAL DATA:  Nausea and vomiting after Roux-en-Y bypass 07/13/2014.  EXAM: CT ABDOMEN AND PELVIS WITH CONTRAST  TECHNIQUE: Multidetector CT imaging of the abdomen and pelvis was performed using the standard protocol following bolus administration of intravenous contrast.  CONTRAST:  144mL OMNIPAQUE IOHEXOL 300 MG/ML  SOLN  COMPARISON:  12/26/2013  FINDINGS: BODY WALL: There is a small fatty umbilical hernia which is unchanged. Neighboring fat infiltration is likely from laparoscopic access. A relatively narrowed necked infraumbilical midline hernia is similar in size, but has new fat infiltration. The infiltrated fat measures 13 cm.  LOWER CHEST: Subsegmental atelectasis at the right base.  ABDOMEN/PELVIS:  Liver: Fatty infiltration of the liver without focal abnormality.  Biliary: Cholecystectomy.  Pancreas: Unremarkable.  Spleen:  Unremarkable.  Adrenals: Unremarkable.  Kidneys and ureters: No hydronephrosis or stone.  Bladder: Unremarkable.  Reproductive: Unremarkable.  Bowel: Status post interval gastric bypass. There is no evidence of anastomotic breakdown or mechanical obstruction. The colon is diffusely fluid-filled with fluid levels reaching to the rectum. Oral contrast has reached the distal transverse colon suggesting this is a diarrheal illness over a ileus. No small bowel dilatation. No contrast within the excluded stomach. There is no complicating abscess. Normal appendix.  Retroperitoneum: No mass or adenopathy.  Peritoneum: No ascites or pneumoperitoneum.  Vascular: No acute abnormality.  OSSEOUS: No acute abnormalities.  IMPRESSION: 1. Diffuse colonic fluid levels suggesting diarrheal illness or less likely mild ileus. 2. No evidence of postoperative abscess or anastomotic breakdown. 3. Large infraumbilical hernia with new congestion of the herniated fat.   Electronically Signed   By: Jorje Guild M.D.   On: 08/28/2014 07:09   Dg Abd 2 Views  08/27/2014   CLINICAL DATA:  Abdominal pain, generalized. Previous gastric bypass surgery.  EXAM: ABDOMEN - 2 VIEW  COMPARISON:  Upper GI in 07/14/2014  FINDINGS: There is gas within the small and large bowel. No evidence for free air on the decubitus images. Multiple calcifications in the pelvis. Small bowel loops in the left abdomen are mildly dilated with a loop measuring up to 3.6 cm.  IMPRESSION: Gas-filled dilated loops of small bowel in the left abdomen. Findings can be associated with a partial small bowel obstruction.   Electronically Signed   By:  Markus Daft M.D.   On: 08/27/2014 11:27    Anti-infectives: Anti-infectives    None      Assessment/Plan:  S/p LRYGB 07/2014 Hypokalemia resolved  See Dr Laqueta Linden note for instructions, she can be discharged from my standpoint.    [facility] 08/29/2014

## 2014-08-29 NOTE — Progress Notes (Signed)
MD notified that patient's manual BP is 90/52.  500cc bolus ordered.  BP rechecked after bolus resulting 90/54 manually.  MD made aware.  No new orders given; patient asymptomatic.  Will continue to monitor patient.

## 2014-08-30 LAB — BASIC METABOLIC PANEL
ANION GAP: 5 (ref 5–15)
BUN: 14 mg/dL (ref 6–23)
CO2: 23 mmol/L (ref 19–32)
Calcium: 8.1 mg/dL — ABNORMAL LOW (ref 8.4–10.5)
Chloride: 109 mmol/L (ref 96–112)
Creatinine, Ser: 1.03 mg/dL (ref 0.50–1.10)
GFR calc Af Amer: 72 mL/min — ABNORMAL LOW (ref >90)
GFR, EST NON AFRICAN AMERICAN: 62 mL/min — AB (ref >90)
GLUCOSE: 80 mg/dL (ref 70–99)
Potassium: 5 mmol/L (ref 3.5–5.1)
Sodium: 137 mmol/L (ref 135–145)

## 2014-08-30 MED ORDER — THERA VITAL M PO TABS
1.0000 | ORAL_TABLET | Freq: Every day | ORAL | Status: AC
Start: 2014-08-30 — End: ?

## 2014-08-30 MED ORDER — LEVOTHYROXINE SODIUM 125 MCG PO TABS: 125.0000 ug | ORAL_TABLET | Freq: Every day | ORAL | Status: DC

## 2014-08-30 MED ORDER — PROMETHAZINE HCL 12.5 MG PO TABS: 6.2500 mg | ORAL_TABLET | Freq: Three times a day (TID) | ORAL | Status: DC | PRN

## 2014-08-30 MED ORDER — FOLIC ACID 1 MG PO TABS: 1.0000 mg | ORAL_TABLET | Freq: Every day | ORAL | Status: DC

## 2014-08-30 MED ORDER — CYANOCOBALAMIN 500 MCG PO TABS: 500.0000 ug | ORAL_TABLET | Freq: Every day | ORAL | Status: DC

## 2014-08-30 MED ORDER — PANTOPRAZOLE SODIUM 40 MG PO TBEC: 40.0000 mg | DELAYED_RELEASE_TABLET | Freq: Every day | ORAL | Status: DC

## 2014-08-30 MED ORDER — THIAMINE HCL 100 MG PO TABS: 100.0000 mg | ORAL_TABLET | Freq: Every day | ORAL | Status: DC

## 2014-08-30 NOTE — Progress Notes (Signed)
Patient ID: Gail Cruz, female   DOB: 1960-10-21, 54 y.o.   MRN: [id number]  [facility]  Patient doing well.  Planning discharge today if transportation issues can be resolved.  Will ask Dr. Lear Ng nurse to schedule follow up appt at [place] office.  Earnstine Regal, MD, [facility] Office: [phone]

## 2014-08-30 NOTE — Discharge Instructions (Signed)
Please follow up with the general surgeons as directed. Schedule an appointment this week with the nutritionist to follow up. You also have an appointment scheduled at the [facility], at which time your bloodwork (potassium) will need to be rechecked.  Use phenergan (promethazine) as needed for nausea or vomiting, but use caution as it may make you sleepy.  We slightly increased the dose of your synthroid while you were here to 125 mcg daily.  A new prescription has been sent in for you.  The instructions below are from the surgeons regarding your supplements:  Vitamins and Minerals  Start 1 day after surgery unless otherwise directed by your surgeon  2 Chewable Multivitamin / Multimineral Supplement with iron (i.e. Centrum for Adults)  Vitamin B-12, 350-500 micrograms sub-lingual (place tablet under the tongue) each day  Chewable Calcium Citrate with Vitamin D-3 (Example: 3 Chewable Calcium Plus 600 with Vitamin D-3)  Take 500 mg three (3) times a day for a total of 1500 mg each day  Do not take all 3 doses of calcium at one time as it may cause constipation, and you can only absorb 500 mg at a time  Do not mix multivitamins containing iron with calcium supplements; take 2 hours apart  Do not substitute Tums (calcium carbonate) for your calcium  Menstruating women and those at risk for anemia ( a blood disease that causes weakness) may need extra iron  Talk to your doctor to see if you need more iron  If you need extra iron: Total daily Iron recommendation (including Vitamins) is 50 to 100 mg Iron/day  Do not stop taking or change any vitamins or minerals until you talk to your nutritionist or surgeon  Your nutritionist and/or surgeon must approve all vitamin and mineral supplements

## 2014-08-30 NOTE — Social Work (Signed)
CSW assessed patient needs for transportation and provided a bus pass for discharge home.   No further CSW needs. Christene Lye MSW, [place]

## 2014-08-30 NOTE — Progress Notes (Signed)
Patient was discharged home by MD order; discharged instructions  review and give to patient with care notes; IV DIC; skin intact; patient will be escorted to the car by nurse tech via wheelchair.  

## 2014-08-30 NOTE — Progress Notes (Signed)
MD notified that patient manual BP is 88/52.  Patient is asymptomatic.  MD stated he wanted BP to be checked again at 08:00.  Will continue to monitor patient.

## 2014-08-30 NOTE — Progress Notes (Signed)
Family Medicine Teaching Service Daily Progress Note Intern Pager: [phone]  Patient name: Gail Cruz Medical record number: [id number] Date of birth: [birth year] Age: 54 y.o. Gender: female  Primary Care Provider: Thersa Salt, DO Consultants: nutrition, Gen Surg Code Status: full  Pt Overview and Major Events to Date:  1/20 - admit to FPTS for dehydration  Assessment and Plan:  Gail Cruz is a 54 y.o. female presenting with dehydration . PMH is significant for gastric bypass, hypothyroidism, diabetes mellitus, type 2.  #Dehydration: patient with poor PO intake and nausea, most likely contributing. Symptoms all starting after recent gastric bypass surgery (roux-en-y, 12//7/15) and modification of her diet. Diarrhea most likely also contributing. No tachycardia. - Nutrition consulted, appreciate recs - Zofran 4mg  q6hrs PRN - gen surg following, obtained CT abd pelvis w/ contrast which was relatively unremarkable (hernia containing fat, no indications for acute intervention at this time). Continue IVF, rec zofran or low dose phenergan before PO intake - SLIV - encourage OOB - d/c home today with prn phenergan, outpt surg & nutrition f/u  #Diarrhea: C diff negative - continue to monitor, replete electrolytes prn  #Hypokalemia: K 2.1 on admission. Had required many doses Kdur then K suddenly up to 5.0>>5.4>>5.0 today - no further K supplementation, will need BMET upon f/u  #Hypothyroidism: TSH 21.917 on admission - continue synthroid 199mcg daily (was previously on 170mcg) - repeat TSH as OP  #Hypertension: initially hypotensive to 92 sytolic. Currently still with soft BP's in systolics of 16'W - d/c lisinopril upon discharge   #Hyperlipidemia - pravastatin 40mg  daily  FEN/GI: Bariatric diet, SLIV Prophylaxis: Heparin subq  Disposition: Pending clinical improvement. Anticipate d/c to home tomorrow.  Subjective:  No complaints. Still feels mildly queasy this morning but  tolerating PO well without antiemetics. Soft BP's overnight but pt states able to ambulate without significant dizziness.  Objective: Temp:  [98.3 F (36.8 C)-98.9 F (37.2 C)] 98.3 F (36.8 C) (01/24 0524) Pulse Rate:  [72-76] 76 (01/24 0524) Resp:  [18-20] 20 (01/24 0524) BP: (88-98)/(37-60) 97/37 mmHg (01/24 0524) SpO2:  [99 %-100 %] 99 % (01/24 0524) Physical Exam: General: well appearing female, no distress, sitting up in bed eating breakfast Cardiovascular: Regular rate and rhythm, no murmur Respiratory: Clear to auscultation bilaterally, NWOB Abdomen: Soft, nontender to palpation. NABS. Extremities: no calf tenderness bilat Skin: Normal skin turgor Neuro: Alert, oriented x3, speech normal, no gross deficits  Laboratory:  Recent Labs Lab 08/26/14 1250 08/27/14 0621 08/28/14 0543  WBC 6.4 7.3 6.7  HGB 14.8 12.9 11.4*  HCT 42.8 38.4 35.3*  PLT 218 205 162    Recent Labs Lab 08/26/14 1250  08/29/14 0436 08/29/14 1651 08/30/14 0500  NA 136  < > 137 138 137  K 2.1*  < > 5.0 5.4* 5.0  CL 92*  < > 112 110 109  CO2 29  < > 19 25 23   BUN 12  < > 9 10 14   CREATININE 1.21*  < > 1.08 1.00 1.03  CALCIUM 9.0  < > 8.3* 8.5 8.1*  PROT 7.0  --   --   --   --   BILITOT 1.2  --   --   --   --   ALKPHOS 89  --   --   --   --   ALT 30  --   --   --   --   AST 22  --   --   --   --  GLUCOSE 107*  < > 84 79 80  < > = values in this interval not displayed.  Imaging/Diagnostic Tests: CT Abd Pelvis W contrast 1/22 1. Diffuse colonic fluid levels suggesting diarrheal illness or less likely mild ileus. 2. No evidence of postoperative abscess or anastomotic breakdown. 3. Large infraumbilical hernia with new congestion of the herniated fat.  Leeanne Rio, MD 08/30/2014, 9:08 AM PGY-3, [place] Intern pager: [phone], text pages welcome

## 2014-09-01 ENCOUNTER — Ambulatory Visit (INDEPENDENT_AMBULATORY_CARE_PROVIDER_SITE_OTHER): Payer: 59 | Admitting: Family Medicine

## 2014-09-01 ENCOUNTER — Encounter: Payer: Self-pay | Admitting: Family Medicine

## 2014-09-01 VITALS — BP 113/78 | HR 76 | Temp 98.6°F | Ht 61.0 in | Wt 250.2 lb

## 2014-09-01 DIAGNOSIS — E860 Dehydration: Secondary | ICD-10-CM

## 2014-09-01 LAB — BASIC METABOLIC PANEL
BUN: 12 mg/dL (ref 6–23)
CALCIUM: 9 mg/dL (ref 8.4–10.5)
CO2: 24 mEq/L (ref 19–32)
Chloride: 103 mEq/L (ref 96–112)
Creat: 0.88 mg/dL (ref 0.50–1.10)
GLUCOSE: 93 mg/dL (ref 70–99)
Potassium: 3.7 mEq/L (ref 3.5–5.3)
SODIUM: 134 meq/L — AB (ref 135–145)

## 2014-09-01 NOTE — Progress Notes (Signed)
Patient ID: Gail Cruz, female   DOB: 01/31/1961, 54 y.o.   MRN: [id number]  HPI:  Hospital follow-up: Patient reports she's been feeling well. Did have a small episode of vomiting this morning, but thinks she ate a bad piece of cheese. Has generally tolerated oral intake. Finally feeling normal.  She was hospitalized for dehydration in the setting of recently undergoing gastric bypass procedure. Her lisinopril was held upon discharge due to low blood pressures. She was quite hypokalemic during her admission, and then suddenly became mildly hyperkalemic after oral repletion.  ROS: See HPI.  [place]: History of diabetes, morbid obesity, hypertension, hypothyroidism, tobacco abuse  PHYSICAL EXAM: BP 113/78 mmHg  Pulse 76  Temp(Src) 98.6 F (37 C) (Oral)  Ht 5\' 1"  (1.549 m)  Wt 250 lb 3.2 oz (113.49 kg)  BMI 47.30 kg/m2  LMP 08/28/2014 Gen: NAD, pleasant, cooperative HEENT: MMM Lungs: NWOB Abd: obese, soft, NTTP Neuro: grossly nonfocal, speech intact Ext: 1+ lower extremity edema bilaterally   ASSESSMENT/PLAN:  Hospital follow-up: Doing well. Appears well-hydrated today. Abdominal exam benign. -Check BMET to ensure potassium issues have resolved -Continue to hold lisinopril as blood pressure is great today without it -Encouraged patient to pick up a Phenergan prescription from her pharmacy -Follow-up with surgery and nutritionist within the next week -Gave note to be out of work through her surgery appointment -Follow-up with PCP in 3-4 weeks  FOLLOW UP: F/u in 3-4 weeks with PCP for routine medical problems  [place] J. Ardelia Mems, [place]

## 2014-09-01 NOTE — Patient Instructions (Addendum)
Stop taking the lisinopril. We'll call you with your lab results. Phenergan (promethazine) is available at your pharmacy. I wrote you out of work through February 3, at which point you'll see the Psychologist, sport and exercise. Follow up with Dr. Lacinda Axon in 3-4 weeks.  Call us with any problems.  Be well, Dr. Ardelia Mems

## 2014-09-03 ENCOUNTER — Telehealth: Payer: Self-pay | Admitting: *Deleted

## 2014-09-03 NOTE — Telephone Encounter (Signed)
Pt informed. Sebrena Engh, CMA  

## 2014-09-03 NOTE — Telephone Encounter (Signed)
-----   Message from Leeanne Rio, MD sent at 09/03/2014 11:34 AM EST ----- Please call pt and let her know that her potassium and kidneys look good.

## 2014-09-09 ENCOUNTER — Encounter: Payer: 59 | Attending: General Surgery | Admitting: Dietician

## 2014-09-09 VITALS — Ht 61.0 in | Wt 252.5 lb

## 2014-09-09 DIAGNOSIS — Z713 Dietary counseling and surveillance: Secondary | ICD-10-CM | POA: Insufficient documentation

## 2014-09-09 DIAGNOSIS — Z6841 Body Mass Index (BMI) 40.0 and over, adult: Secondary | ICD-10-CM | POA: Diagnosis not present

## 2014-09-09 NOTE — Patient Instructions (Addendum)
Goals:  Follow Phase 3B: High Protein + Non-Starchy Vegetables  Eat 3-6 small meals/snacks, every 3-5 hrs  Increase lean protein foods to meet 60g goal  Increase fluid intake to 64oz +  Add one water bottle per day  Avoid drinking 15 minutes before, during and 30 minutes after eating  Aim for >30 min of physical activity daily  Try [place] Amber sauces/dressing or sugar BBQ or Ketchup, or fat free [place] Yogurt with Hidden [place] Oil  Limit carbs (bread, pasta) for now

## 2014-09-09 NOTE — Progress Notes (Signed)
  Follow-up visit:  8 Weeks Post-Operative RYGB Surgery  Medical Nutrition Therapy:  Appt start time: 1400 end time:  1435.  Primary concerns today: Post-operative Bariatric Surgery Nutrition Management. Returns with a 27.5 lbs fat mass loss. Feeling very good now and has a lot of energy. Was admitted to the hospital for dehydrated. Not able to tolerate protein shakes but tolerating meat, fish, ham, and zucchini.   Nausea is a lot better than it was. Drinking milk, water, G2, and crystal light. Tried a half biscuit and macaroni and cheese.    Surgery date: 07/13/2014 Surgery type: RYGB Start weight at [facility]: 292.5 lbs on 05/29/2014 Weight today: 252.5 lbs  Weight change: 1.5 lbs, 27.5 lbsfat mass loss Total weight loss: 40 lbs  TANITA  BODY COMP RESULTS  06/29/14 07/28/14 09/09/14   BMI (kg/m^2) 52 48 47.7   Fat Mass (lbs) 148.5 143.0 115.5   Fat Free Mass (lbs) 126.5 111.0 137.5   Total Body Water (lbs) 92.5 81.5 100.5    Preferred Learning Style:   No preference indicated   Learning Readiness:   Ready  24-hr recall: B (8AM): egg, shredded cheese, and [place] bacon and fat free thousand dressing and 1/2 English muffin(10 g) Snk (AM): none L (12PM): 2 oz chicken with fat free thousand dressing (14 g) Snk (3PM): 2-3 oz flounder with zucchini(14-21 g) D (7 PM): chicken salad with an egg (14 oz) Snk (PM): 4-8 oz milk (6-12 g)  Fluid intake: 34 oz water,6 oz G2, 4-8 oz milk = 44-48 oz Estimated total protein intake: 56-63 g  Medications: see list Supplementation: taking, swallowing multivitamin (50 and over)  Using straws: did one time Drinking while eating: No Hair loss: No Carbonated beverages: No N/V/D/C: No (better now) Dumping syndrome: when she drank [employer]  Recent physical activity:  Walking some   Progress Towards Goal(s):  In progress.  Handouts given during visit include:  Phase 3B High Protein + Non Starchy   Nutritional Diagnosis:  [place]-3.3  Overweight/obesity related to past poor dietary habits and physical inactivity as evidenced by patient w/ recent RYGB surgery following dietary guidelines for continued weight loss.    Intervention:  Nutrition education/diet advancement Goals:  Follow Phase 3B: High Protein + Non-Starchy Vegetables  Eat 3-6 small meals/snacks, every 3-5 hrs  Increase lean protein foods to meet 60g goal  Increase fluid intake to 64oz +  Add one water bottle per day  Avoid drinking 15 minutes before, during and 30 minutes after eating  Aim for >30 min of physical activity daily  Try [place] Amber sauces/dressing or sugar BBQ or Ketchup, or fat free [place] Yogurt with [employer] packets  Limit carbs (bread, pasta) for now  Teaching Method Utilized:  Visual Auditory Hands on  Barriers to learning/adherence to lifestyle change:   Demonstrated degree of understanding via:  Teach Back   Monitoring/Evaluation:  Dietary intake, exercise, and body weight. Follow up in 1 months for 3 month post-op visit.

## 2014-09-10 ENCOUNTER — Inpatient Hospital Stay: Payer: 59 | Admitting: Family Medicine

## 2014-09-16 ENCOUNTER — Telehealth: Payer: Self-pay | Admitting: Family Medicine

## 2014-09-16 NOTE — Telephone Encounter (Signed)
Blanca from [facility] called and needs [place] to do a compass referral for the patient follow up on his Bariatric surgery on March 2. The doctor will be  Dr. Excell Seltzer his NPI [id number]. The diagnostic codes are Z98.84 and E66.01. Please fax this to [phone] attention [place]. [email or web address]

## 2014-09-16 NOTE — Telephone Encounter (Signed)
Faxed out Referral # [id number]

## 2014-09-23 ENCOUNTER — Encounter: Payer: Self-pay | Admitting: Family Medicine

## 2014-09-23 ENCOUNTER — Ambulatory Visit (INDEPENDENT_AMBULATORY_CARE_PROVIDER_SITE_OTHER): Payer: 59 | Admitting: Family Medicine

## 2014-09-23 VITALS — BP 145/75 | HR 72 | Temp 98.5°F | Ht 61.0 in | Wt 245.8 lb

## 2014-09-23 DIAGNOSIS — E785 Hyperlipidemia, unspecified: Secondary | ICD-10-CM

## 2014-09-23 DIAGNOSIS — L0390 Cellulitis, unspecified: Secondary | ICD-10-CM | POA: Insufficient documentation

## 2014-09-23 DIAGNOSIS — Z6841 Body Mass Index (BMI) 40.0 and over, adult: Secondary | ICD-10-CM

## 2014-09-23 DIAGNOSIS — E039 Hypothyroidism, unspecified: Secondary | ICD-10-CM

## 2014-09-23 DIAGNOSIS — I10 Essential (primary) hypertension: Secondary | ICD-10-CM

## 2014-09-23 DIAGNOSIS — L03115 Cellulitis of right lower limb: Secondary | ICD-10-CM

## 2014-09-23 LAB — LIPID PANEL
CHOLESTEROL: 113 mg/dL (ref 0–200)
HDL: 37 mg/dL — AB (ref >39)
LDL CALC: 57 mg/dL (ref 0–99)
Total CHOL/HDL Ratio: 3.1 Ratio
Triglycerides: 97 mg/dL (ref <150)
VLDL: 19 mg/dL (ref 0–40)

## 2014-09-23 MED ORDER — DOXYCYCLINE HYCLATE 100 MG PO TABS: 100.0000 mg | ORAL_TABLET | Freq: Two times a day (BID) | ORAL | Status: DC

## 2014-09-23 NOTE — Assessment & Plan Note (Signed)
Given weight loss, patient is eager to see if weight loss has made an impact as she would like to come off medications. Lipid panel today.

## 2014-09-23 NOTE — Assessment & Plan Note (Signed)
Future TSH ordered (due at beginning of March since recent dose change).

## 2014-09-23 NOTE — Progress Notes (Signed)
   Subjective:    Patient ID: Gail Cruz, female    DOB: 1961-03-11, 54 y.o.   MRN: [id number]  HPI 54 year old female with diet controlled DM-2, HTN, HLD and morbid obesity s/p gastric bypass presents for follow up.  1) Recent Hospitalization  Patient was recently admitted from 1/20 - 1/24 with nausea/vomiting and dehydration.    She reports that she is now doing very well.  Her nausea and vomiting has resolved.   She is doing well with her PO intake and is following the recommendations from her nutritionist.   She is continuing to lose weight and is pleased with her success thus far.  2) Right leg redness  Patient reports that she has recently noticed some redness and mild swelling of her right lower leg.  She has had a recent break in the skin.  No reported fever, chills.  She is feeling well.  Social Hx - Former Smoker.  Review of Systems Per HPI    Objective:   Physical Exam Filed Vitals:   09/23/14 1353  BP: 145/75  Pulse: 72  Temp: 98.5 F (36.9 C)   Exam: General: well appearing obese female in NAD. Cardiovascular: RRR. No murmurs, rubs, or gallops. Respiratory: CTAB. No rales, rhonchi, or wheeze. Abdomen: soft, nontender, nondistended. Skin: Small superficial wound noted with surrounding erythema.     Assessment & Plan:  See Problem List

## 2014-09-23 NOTE — Patient Instructions (Signed)
It was nice to see you today.  Continue your medications as prescribed.  I will be in touch regarding your labs.  Please come in and get your TSH drawn at the beginning of March.  Follow up with me in ~ 6 months.

## 2014-09-23 NOTE — Assessment & Plan Note (Signed)
Mild. Will treat w/ Doxycycline.

## 2014-09-23 NOTE — Assessment & Plan Note (Signed)
Doing well currently.  She is pleased with her success. Patient to continue to follow up with surgery and nutrition.

## 2014-09-23 NOTE — Assessment & Plan Note (Signed)
BP mildly elevated today. Patient currently off all medications. Will continue to follow closely. Will not add medication on today.

## 2014-09-24 ENCOUNTER — Telehealth: Payer: Self-pay | Admitting: Family Medicine

## 2014-09-24 NOTE — Telephone Encounter (Signed)
Informed patient of lipid panel results. Trial off statin x 3 months.

## 2014-10-07 ENCOUNTER — Other Ambulatory Visit: Payer: 59

## 2014-10-09 ENCOUNTER — Other Ambulatory Visit: Payer: 59

## 2014-10-09 DIAGNOSIS — E039 Hypothyroidism, unspecified: Secondary | ICD-10-CM

## 2014-10-09 NOTE — Progress Notes (Signed)
[facility] phlebotomist drew:  TSH

## 2014-10-10 LAB — TSH: TSH: 0.799 u[IU]/mL (ref 0.350–4.500)

## 2014-10-11 ENCOUNTER — Other Ambulatory Visit: Payer: Self-pay | Admitting: Family Medicine

## 2014-10-12 ENCOUNTER — Telehealth: Payer: Self-pay | Admitting: *Deleted

## 2014-10-12 NOTE — Telephone Encounter (Signed)
LVM for patient to call back. ?

## 2014-10-12 NOTE — Telephone Encounter (Signed)
-----   Message from Coral Spikes, DO sent at 10/11/2014  9:13 AM EST ----- Please inform of normal TSH.

## 2014-10-13 NOTE — Telephone Encounter (Signed)
Message delivered

## 2014-10-14 ENCOUNTER — Encounter: Payer: 59 | Attending: General Surgery | Admitting: Dietician

## 2014-10-14 VITALS — Ht 61.0 in | Wt 230.0 lb

## 2014-10-14 DIAGNOSIS — Z6841 Body Mass Index (BMI) 40.0 and over, adult: Secondary | ICD-10-CM | POA: Diagnosis not present

## 2014-10-14 DIAGNOSIS — Z713 Dietary counseling and surveillance: Secondary | ICD-10-CM | POA: Insufficient documentation

## 2014-10-14 NOTE — Patient Instructions (Addendum)
Goals:  Follow Phase 3B: High Protein + Non-Starchy Vegetables  Eat 3-6 small meals/snacks, every 3-5 hrs  Increase lean protein foods to meet 60g goal  Increase fluid intake to 64oz + (3 bottles per day)  Avoid drinking 15 minutes before, during and 30 minutes after eating  Aim for >30 min of physical activity daily, plan to start walking 30 for 3-4 x week  Take a multivitamin with iron  Eat protein first and then non starchy vegetables  Have 1 slice of bread or 1/2 English muffin per week (after protein)

## 2014-10-14 NOTE — Progress Notes (Signed)
  Follow-up visit:  12 Weeks Post-Operative RYGB Surgery  Medical Nutrition Therapy:  Appt start time: 1400 end time:  0165.  Primary concerns today: Post-operative Bariatric Surgery Nutrition Management. Returns with a 22.5 lb weight loss (mostly water and fat free mass). Has been eating a lot of vegetables. Energy level is good. Has not been nauseas for awhile.   Not exercising much yet.   Surgery date: 07/13/2014 Surgery type: RYGB Start weight at [facility]: 292.5 lbs on 05/29/2014 Weight today: 230.0 lbs  Weight change: 22.5 lbs Total weight loss: 62.5 lbs Goal: 150 lbs  TANITA  BODY COMP RESULTS  06/29/14 07/28/14 09/09/14 10/14/14   BMI (kg/m^2) 52 48 47.7 43.5   Fat Mass (lbs) 148.5 143.0 115.5 115.0   Fat Free Mass (lbs) 126.5 111.0 137.5 115.0   Total Body Water (lbs) 92.5 81.5 100.5 84.0    Preferred Learning Style:   No preference indicated   Learning Readiness:   Ready  24-hr recall: B (8AM): egg with 2 pieces of [place] bacon (10 g) Snk (AM): none or 1 oz montery jack (7 g) L (12PM): 2-3 oz chicken with vegetables or chicken salad with egg with fat free mayo (14-21 g) Snk (PM): 1 oz montery jack/ham/turkey  (7 g) D (7 PM):2-3 oz protein  (14-21 oz) Snk (PM): 4-8 oz milk sometimes   Fluid intake: 34-51 oz water, 6 oz G2, 4-8 oz milk = 44 - 61 oz Estimated total protein intake: 58-70 g  Medications: see list Supplementation: taking, swallowing multivitamin (50 and over)  Using straws: No Drinking while eating: No Hair loss: No Carbonated beverages: No N/V/D/C: No Dumping syndrome: No  Recent physical activity:  Walking at work, not a lot  Progress Towards Goal(s):  In progress.  Handouts given during visit include:  Phase 3B High Protein + Non Starchy   Nutritional Diagnosis:  [place]-3.3 Overweight/obesity related to past poor dietary habits and physical inactivity as evidenced by patient w/ recent RYGB surgery following dietary guidelines for continued  weight loss.    Intervention:  Nutrition education/diet reinforcement Goals:  Follow Phase 3B: High Protein + Non-Starchy Vegetables  Eat 3-6 small meals/snacks, every 3-5 hrs  Increase lean protein foods to meet 60g goal  Increase fluid intake to 64oz + (3 bottles per day)  Avoid drinking 15 minutes before, during and 30 minutes after eating  Aim for >30 min of physical activity daily, plan to start walking 30 for 3-4 x week  Take a multivitamin with iron  Eat protein first and then non starchy vegetables  Have 1 slice of bread or 1/2 English muffin per week (after protein)  Teaching Method Utilized:  Visual Auditory Hands on  Barriers to learning/adherence to lifestyle change: none  Demonstrated degree of understanding via:  Teach Back   Monitoring/Evaluation:  Dietary intake, exercise, and body weight. Follow up in 3 months for 6 month post-op visit.

## 2014-10-23 ENCOUNTER — Other Ambulatory Visit: Payer: Self-pay | Admitting: Family Medicine

## 2014-10-23 DIAGNOSIS — Z1231 Encounter for screening mammogram for malignant neoplasm of breast: Secondary | ICD-10-CM

## 2014-10-26 ENCOUNTER — Other Ambulatory Visit: Payer: Self-pay | Admitting: Family Medicine

## 2014-11-20 ENCOUNTER — Other Ambulatory Visit: Payer: Self-pay | Admitting: Family Medicine

## 2014-12-01 ENCOUNTER — Ambulatory Visit (HOSPITAL_COMMUNITY)
Admission: RE | Admit: 2014-12-01 | Discharge: 2014-12-01 | Disposition: A | Discharge status: Home / Self Care | Payer: 59 | Source: Ambulatory Visit | Attending: Family Medicine | Admitting: Family Medicine

## 2014-12-01 DIAGNOSIS — Z1231 Encounter for screening mammogram for malignant neoplasm of breast: Secondary | ICD-10-CM | POA: Diagnosis not present

## 2014-12-02 ENCOUNTER — Encounter: Payer: Self-pay | Admitting: Family Medicine

## 2014-12-23 ENCOUNTER — Other Ambulatory Visit: Payer: Self-pay | Admitting: Family Medicine

## 2014-12-23 IMAGING — RF DG UGI W/ KUB
12 of 15 series · 15 of 24 positions shown · non-contrast
Comparison: CT 12/26/2013

CLINICAL DATA: Preop bariatric surgery

EXAM:
UPPER GI SERIES WITH KUB
TECHNIQUE: After obtaining a scout radiograph a routine upper GI series was
performed using thin and high density barium.
FLUOROSCOPY TIME:  1 min

[Series 1: run · 1 of 2 slices shown (1 of 11)]
[im 1/2]
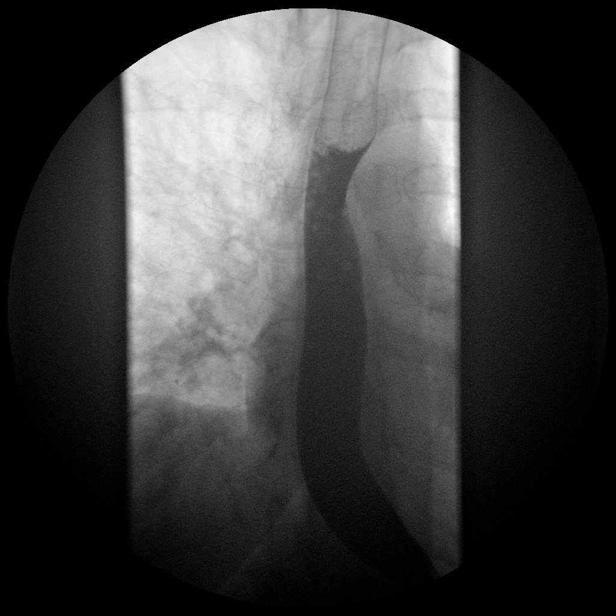

[Series 3: run · 1 of 2 slices shown (2 of 11)]
[im 1/2]
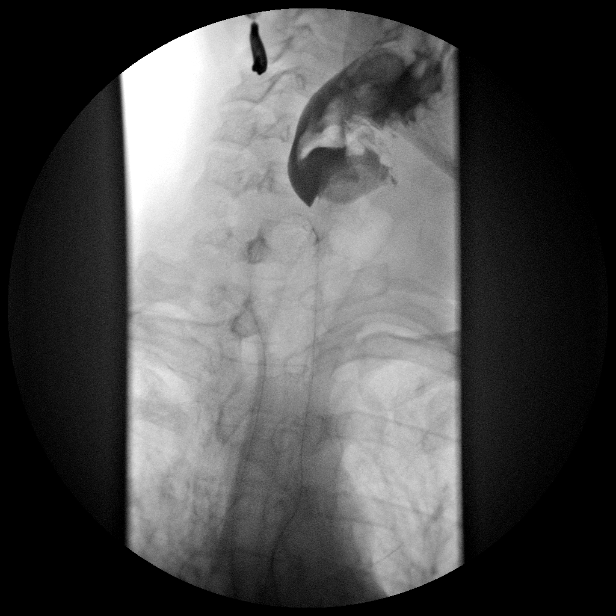

[Series 5: run · 2 of 2 slices shown (3 of 11)]
[im 1/2]
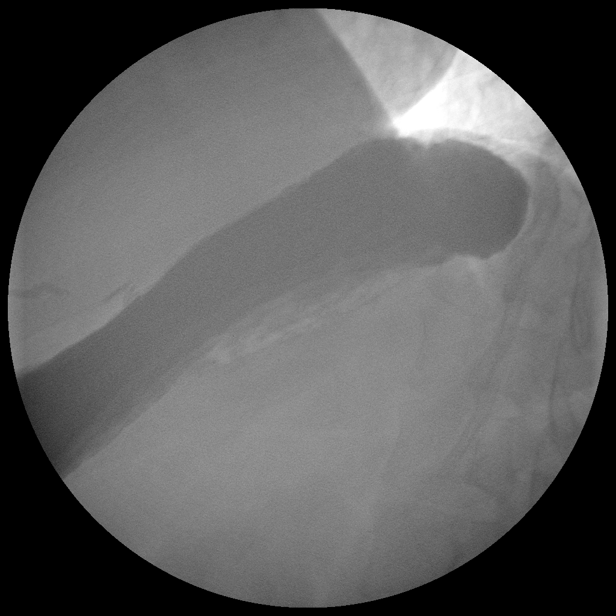
[im 2/2]
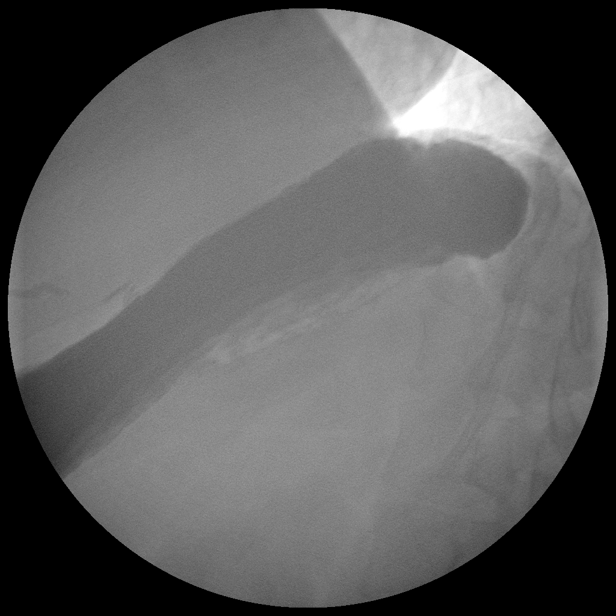

[Series 6: run · 1 of 2 slices shown (4 of 11)]
[im 2/2]
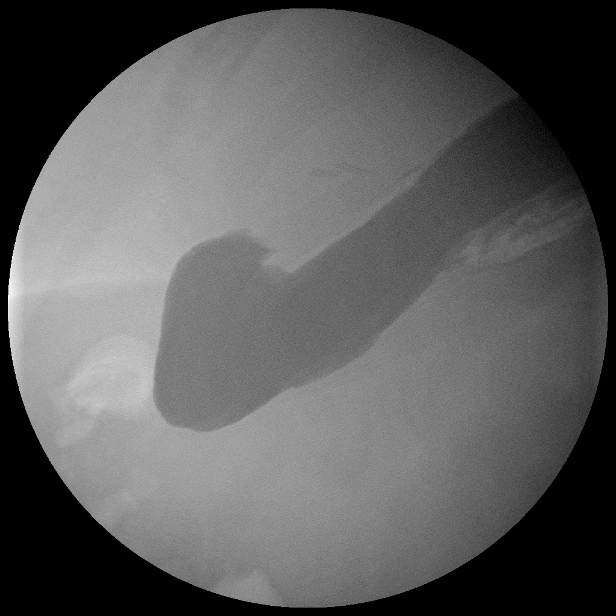

[Series 7: run · 1 of 2 slices shown (5 of 11)]
[im 1/2]
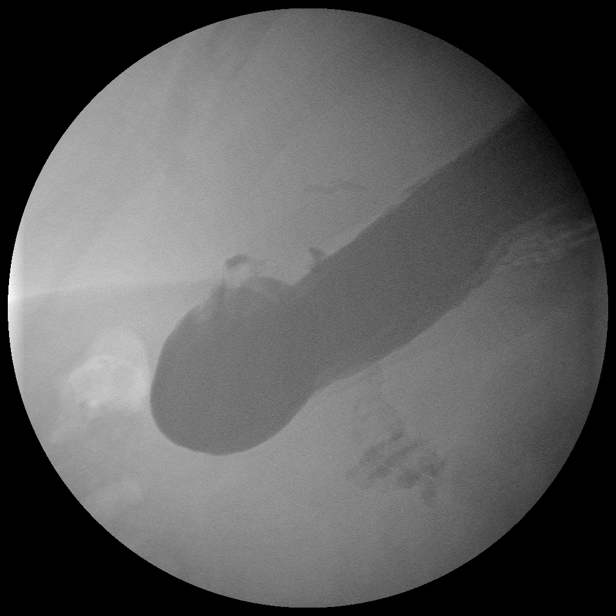

[Series 8: run · 1 of 2 slices shown (6 of 11)]
[im 1/2]
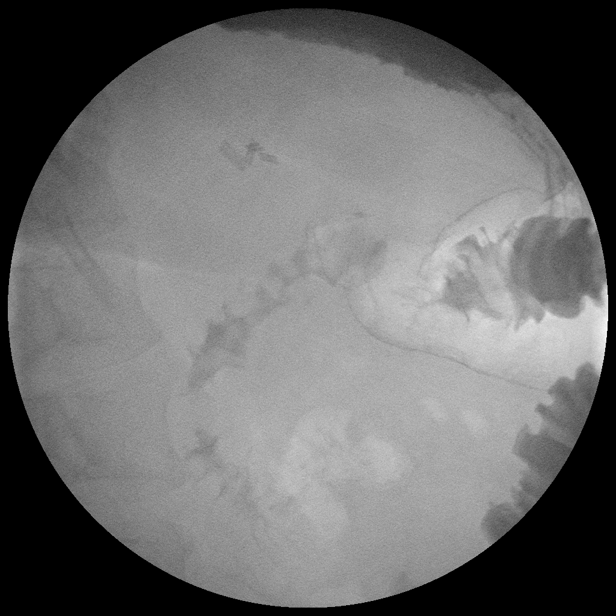

[Series 9: run · 2 of 2 slices shown (7 of 11)]
[im 1/2]
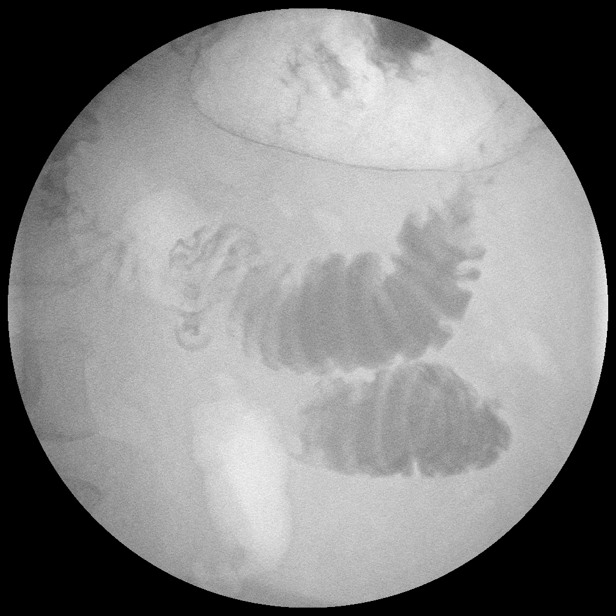
[im 2/2]
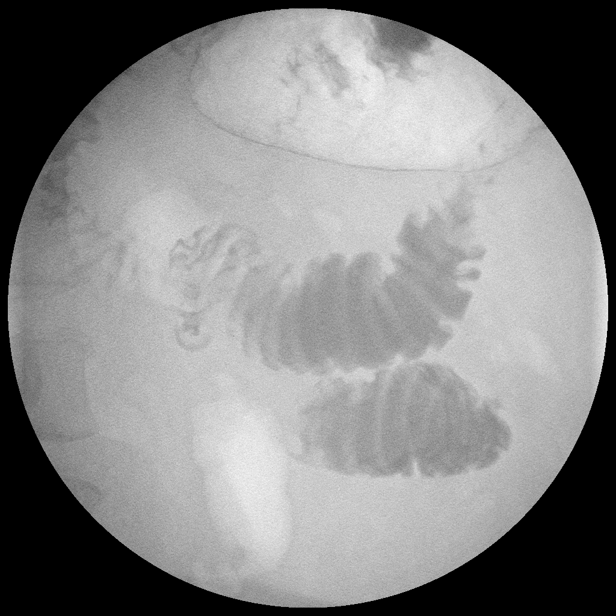

[Series 10: run · 1 of 2 slices shown (8 of 11)]
[im 2/2]
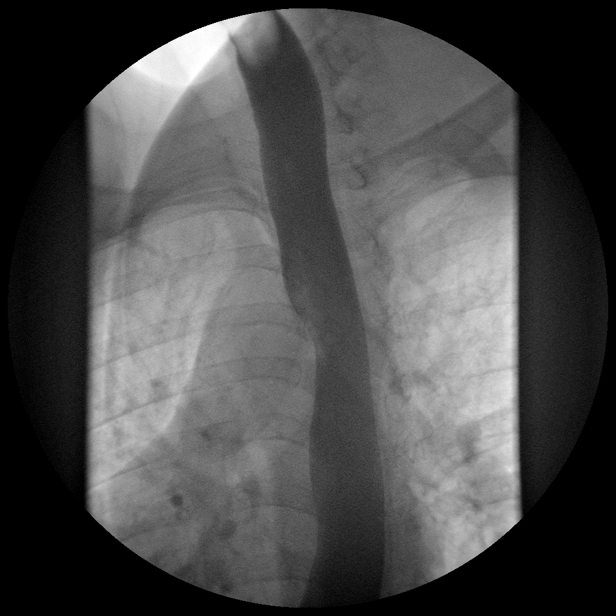

[Series 11: run · 1 of 2 slices shown (9 of 11)]
[im 1/2]
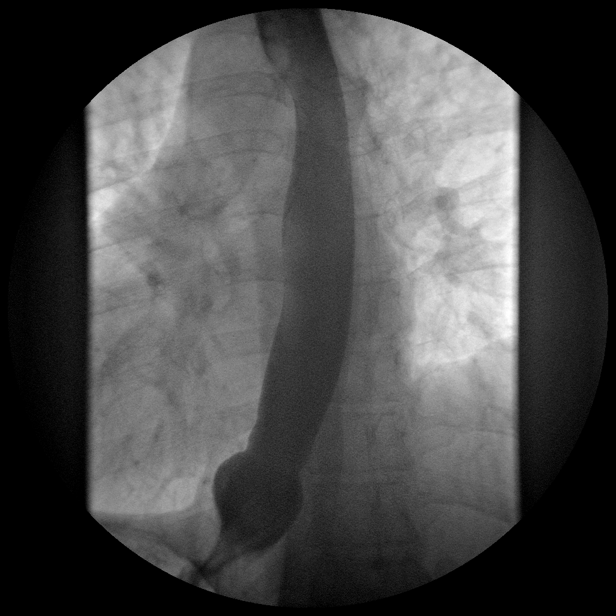

[Series 12: run · 1 of 2 slices shown (10 of 11)]
[im 1/2]
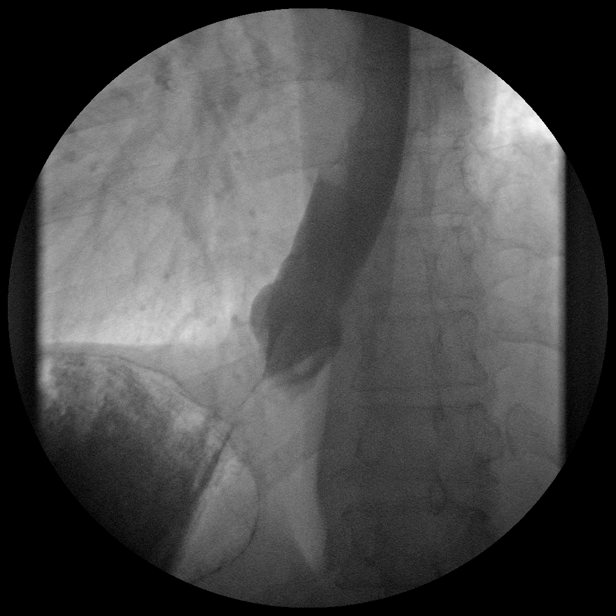

[Series 13: run · 2 of 2 slices shown (11 of 11)]
[im 1/2]
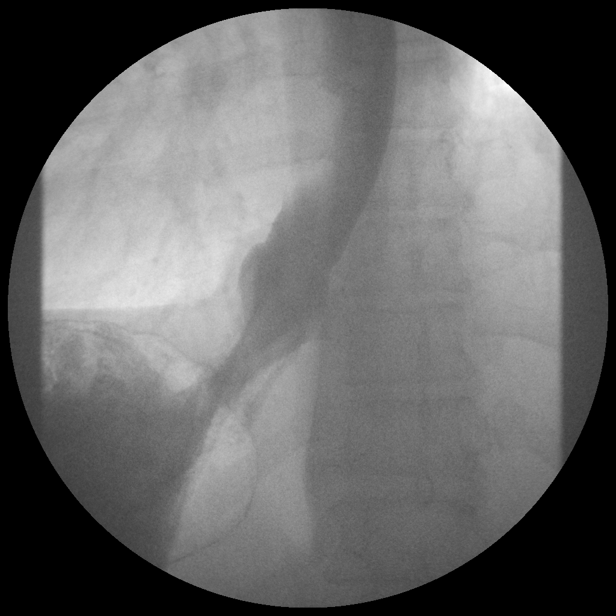
[im 2/2]
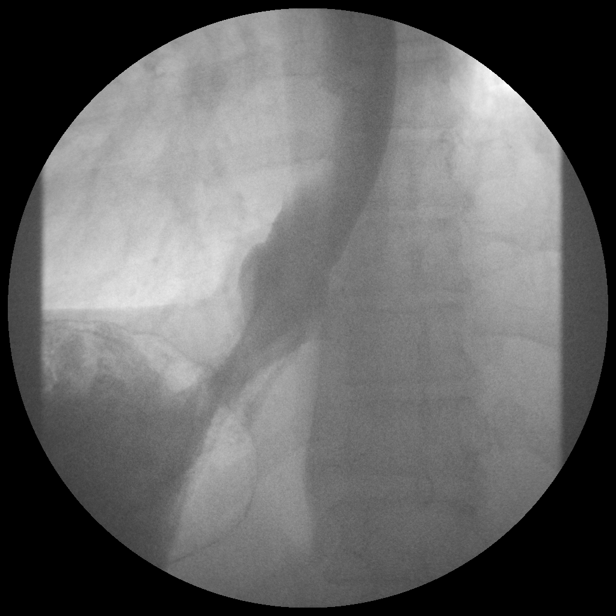

[Series 1002: view not recorded · 0.20mm/px · 1 of 1 slices shown]
[im 1/1]
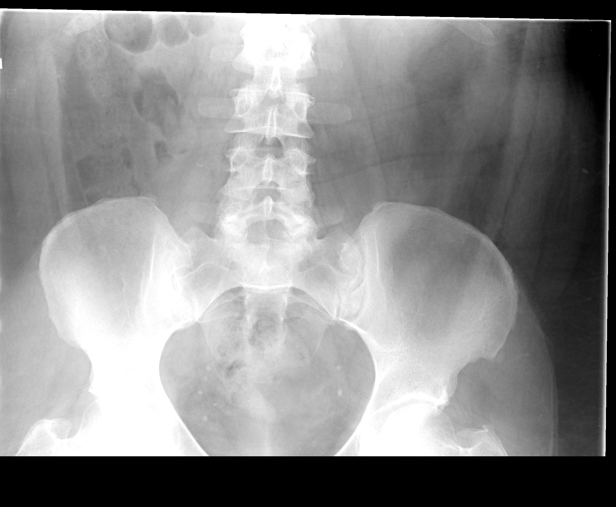

[15 of 24 positions shown; findings below may reference images not displayed]

FINDINGS: On the scout radiograph cholecystectomy clips noted in the right
upper quadrant of the abdomen. There is no evidence of esophageal
mass or stricture. No findings of esophagitis are seen. There is a
small hiatal hernia. Esophageal motility is within normal limits.
There is no evidence of gastroesophageal reflux. The stomach,
duodenal bulb and sweep appear normal.
IMPRESSION: 1. Small hiatal hernia.

## 2014-12-29 ENCOUNTER — Ambulatory Visit
Admission: RE | Admit: 2014-12-29 | Discharge: 2014-12-29 | Disposition: A | Discharge status: Home / Self Care | Payer: 59 | Source: Ambulatory Visit | Attending: General Surgery | Admitting: General Surgery

## 2014-12-29 DIAGNOSIS — R911 Solitary pulmonary nodule: Secondary | ICD-10-CM

## 2015-01-13 ENCOUNTER — Encounter: Payer: 59 | Attending: General Surgery | Admitting: Dietician

## 2015-01-13 ENCOUNTER — Encounter: Payer: Self-pay | Admitting: Dietician

## 2015-01-13 VITALS — Ht 61.0 in | Wt 202.5 lb

## 2015-01-13 DIAGNOSIS — Z6841 Body Mass Index (BMI) 40.0 and over, adult: Secondary | ICD-10-CM | POA: Diagnosis not present

## 2015-01-13 DIAGNOSIS — Z713 Dietary counseling and surveillance: Secondary | ICD-10-CM | POA: Diagnosis not present

## 2015-01-13 NOTE — Patient Instructions (Addendum)
Goals:  Follow Phase 3B: High Protein + Non-Starchy Vegetables  Eat 3-6 small meals/snacks, every 3-5 hrs  Increase lean protein foods to meet 60g goal  Aim to have about 3 oz of protein (deck of cards) at lunch and dinner  Increase fluid intake to 64oz   Avoid drinking 15 minutes before, during and 30 minutes after eating  Aim for >30 min of physical activity daily, Continue walking 30 for 4-5 x week  Eat protein first and then non starchy vegetables, if you are still hungry add fruit  Have 1 slice of bread or 1/2 English muffin per week (after protein)  Try to add biotin or hair/skin/nails vitamin   Think about having tea with splenda or stevia  Surgery date: 07/13/2014 Surgery type: RYGB Start weight at [facility]: 292.5 lbs on 05/29/2014 Weight today: 202.5 lbs  Weight change: 27.5 lbs Total weight loss: 90.0 lbs Goal: 150 lbs  TANITA  BODY COMP RESULTS  06/29/14 07/28/14 09/09/14 10/14/14 01/13/15   BMI (kg/m^2) 52 48 47.7 43.5 38.3   Fat Mass (lbs) 148.5 143.0 115.5 115.0 88.5   Fat Free Mass (lbs) 126.5 111.0 137.5 115.0 114.0   Total Body Water (lbs) 92.5 81.5 100.5 84.0 83.5

## 2015-01-13 NOTE — Progress Notes (Signed)
  Follow-up visit:  6 Months Post-Operative RYGB Surgery  Medical Nutrition Therapy:  Appt start time: 1350 end time:  1425.  Primary concerns today: Post-operative Bariatric Surgery Nutrition Management. Returns with a 27.5 lb weight loss since last visit. Has been feeling great. Energy level is great. Has been drinking a lot of fluid.   Works 12 hours. Having 1 slice of bread per week. Having some triscuits at snacks.  Surgery date: 07/13/2014 Surgery type: RYGB Start weight at [facility]: 292.5 lbs on 05/29/2014 Weight today: 202.5 lbs  Weight change: 27.5 lbs Total weight loss: 90.0 lbs Goal: 150 lbs  TANITA  BODY COMP RESULTS  06/29/14 07/28/14 09/09/14 10/14/14 01/13/15   BMI (kg/m^2) 52 48 47.7 43.5 38.3   Fat Mass (lbs) 148.5 143.0 115.5 115.0 88.5   Fat Free Mass (lbs) 126.5 111.0 137.5 115.0 114.0   Total Body Water (lbs) 92.5 81.5 100.5 84.0 83.5    Preferred Learning Style:   No preference indicated   Learning Readiness:   Ready  24-hr recall: B (8AM): egg with 2 pieces of [place] bacon with tomato 10 g) Snk (AM): none or triscuits with 2% cheese (7 g) L (12PM): 2-3 oz chicken with sauteed vegetables or chicken/tuna salad with egg with fat free mayo (14-21 g) Snk (PM): 1 oz montery jack or carrots  (0-7 g) D (7 PM):2-3 oz protein or chili beans (14-21 oz) Snk (PM): 4  oz milk sometimes (4 g)  Fluid intake: 60 oz water, 4 oz milk = 64 oz Estimated total protein intake: 49-63 g  Medications: see list Supplementation: taking  Using straws: No Drinking while eating: No Hair loss: Yes Carbonated beverages: No N/V/D/C: No Dumping syndrome: No  Recent physical activity:  Walking 20-25 minutes 4-5 x week   Progress Towards Goal(s):  In progress.  Handouts given out during appointment: High Protein Snacks   Nutritional Diagnosis:  [place]-3.3 Overweight/obesity related to past poor dietary habits and physical inactivity as evidenced by patient w/ recent RYGB surgery  following dietary guidelines for continued weight loss.    Intervention:  Nutrition education/diet reinforcement Goals:  Follow Phase 3B: High Protein + Non-Starchy Vegetables  Eat 3-6 small meals/snacks, every 3-5 hrs  Increase lean protein foods to meet 60g goal  Aim to have about 3 oz of protein (deck of cards) at lunch and dinner  Increase fluid intake to 64oz   Avoid drinking 15 minutes before, during and 30 minutes after eating  Aim for >30 min of physical activity daily, Continue walking 30 for 4-5 x week  Eat protein first and then non starchy vegetables, if you are still hungry add fruit  Have 1 slice of bread or 1/2 English muffin per week (after protein)  Try to add biotin or hair/skin/nails vitamin   Think about having tea with splenda or stevia  Teaching Method Utilized:  Visual Auditory Hands on  Barriers to learning/adherence to lifestyle change: none  Demonstrated degree of understanding via:  Teach Back   Monitoring/Evaluation:  Dietary intake, exercise, and body weight. Follow up in 3 months for 9 month post-op visit.

## 2015-01-25 ENCOUNTER — Other Ambulatory Visit: Payer: Self-pay | Admitting: Family Medicine

## 2015-02-13 ENCOUNTER — Emergency Department (HOSPITAL_COMMUNITY): Payer: 59

## 2015-02-13 ENCOUNTER — Encounter (HOSPITAL_COMMUNITY): Payer: Self-pay | Admitting: Emergency Medicine

## 2015-02-13 ENCOUNTER — Emergency Department (HOSPITAL_COMMUNITY)
Admission: EM | Admit: 2015-02-13 | Discharge: 2015-02-13 | Disposition: A | Discharge status: Home / Self Care | Payer: 59 | Attending: Emergency Medicine | Admitting: Emergency Medicine

## 2015-02-13 DIAGNOSIS — F329 Major depressive disorder, single episode, unspecified: Secondary | ICD-10-CM | POA: Diagnosis not present

## 2015-02-13 DIAGNOSIS — Y939 Activity, unspecified: Secondary | ICD-10-CM | POA: Diagnosis not present

## 2015-02-13 DIAGNOSIS — E039 Hypothyroidism, unspecified: Secondary | ICD-10-CM | POA: Diagnosis not present

## 2015-02-13 DIAGNOSIS — X58XXXA Exposure to other specified factors, initial encounter: Secondary | ICD-10-CM | POA: Diagnosis not present

## 2015-02-13 DIAGNOSIS — R109 Unspecified abdominal pain: Secondary | ICD-10-CM | POA: Diagnosis present

## 2015-02-13 DIAGNOSIS — Z87891 Personal history of nicotine dependence: Secondary | ICD-10-CM | POA: Insufficient documentation

## 2015-02-13 DIAGNOSIS — E119 Type 2 diabetes mellitus without complications: Secondary | ICD-10-CM | POA: Insufficient documentation

## 2015-02-13 DIAGNOSIS — S4992XA Unspecified injury of left shoulder and upper arm, initial encounter: Secondary | ICD-10-CM | POA: Diagnosis not present

## 2015-02-13 DIAGNOSIS — E785 Hyperlipidemia, unspecified: Secondary | ICD-10-CM | POA: Insufficient documentation

## 2015-02-13 DIAGNOSIS — Y998 Other external cause status: Secondary | ICD-10-CM | POA: Diagnosis not present

## 2015-02-13 DIAGNOSIS — K219 Gastro-esophageal reflux disease without esophagitis: Secondary | ICD-10-CM | POA: Diagnosis not present

## 2015-02-13 DIAGNOSIS — Y929 Unspecified place or not applicable: Secondary | ICD-10-CM | POA: Insufficient documentation

## 2015-02-13 DIAGNOSIS — Z79899 Other long term (current) drug therapy: Secondary | ICD-10-CM | POA: Diagnosis not present

## 2015-02-13 DIAGNOSIS — R1012 Left upper quadrant pain: Secondary | ICD-10-CM

## 2015-02-13 DIAGNOSIS — I10 Essential (primary) hypertension: Secondary | ICD-10-CM | POA: Insufficient documentation

## 2015-02-13 LAB — HEPATIC FUNCTION PANEL
ALBUMIN: 3.4 g/dL — AB (ref 3.5–5.0)
ALK PHOS: 77 U/L (ref 38–126)
ALT: 21 U/L (ref 14–54)
AST: 20 U/L (ref 15–41)
Bilirubin, Direct: 0.1 mg/dL (ref 0.1–0.5)
Indirect Bilirubin: 0.6 mg/dL (ref 0.3–0.9)
Total Bilirubin: 0.7 mg/dL (ref 0.3–1.2)
Total Protein: 6.5 g/dL (ref 6.5–8.1)

## 2015-02-13 LAB — CBC WITH DIFFERENTIAL/PLATELET
Basophils Absolute: 0 10*3/uL (ref 0.0–0.1)
Basophils Relative: 0 % (ref 0–1)
EOS ABS: 0.2 10*3/uL (ref 0.0–0.7)
EOS PCT: 2 % (ref 0–5)
HCT: 42.9 % (ref 36.0–46.0)
HEMOGLOBIN: 14.1 g/dL (ref 12.0–15.0)
LYMPHS ABS: 2.8 10*3/uL (ref 0.7–4.0)
Lymphocytes Relative: 34 % (ref 12–46)
MCH: 27.6 pg (ref 26.0–34.0)
MCHC: 32.9 g/dL (ref 30.0–36.0)
MCV: 84 fL (ref 78.0–100.0)
Monocytes Absolute: 0.5 10*3/uL (ref 0.1–1.0)
Monocytes Relative: 5 % (ref 3–12)
NEUTROS PCT: 59 % (ref 43–77)
Neutro Abs: 4.9 10*3/uL (ref 1.7–7.7)
Platelets: 210 10*3/uL (ref 150–400)
RBC: 5.11 MIL/uL (ref 3.87–5.11)
RDW: 15.2 % (ref 11.5–15.5)
WBC: 8.4 10*3/uL (ref 4.0–10.5)

## 2015-02-13 LAB — I-STAT CHEM 8, ED
BUN: 23 mg/dL — ABNORMAL HIGH (ref 6–20)
Calcium, Ion: 1.22 mmol/L (ref 1.12–1.23)
Chloride: 107 mmol/L (ref 101–111)
Creatinine, Ser: 0.7 mg/dL (ref 0.44–1.00)
Glucose, Bld: 124 mg/dL — ABNORMAL HIGH (ref 65–99)
HEMATOCRIT: 44 % (ref 36.0–46.0)
HEMOGLOBIN: 15 g/dL (ref 12.0–15.0)
Potassium: 4.7 mmol/L (ref 3.5–5.1)
Sodium: 144 mmol/L (ref 135–145)
TCO2: 24 mmol/L (ref 0–100)

## 2015-02-13 LAB — LIPASE, BLOOD: Lipase: 18 U/L — ABNORMAL LOW (ref 22–51)

## 2015-02-13 LAB — URINALYSIS, ROUTINE W REFLEX MICROSCOPIC
BILIRUBIN URINE: NEGATIVE
Glucose, UA: NEGATIVE mg/dL
Ketones, ur: 15 mg/dL — AB
LEUKOCYTES UA: NEGATIVE
Nitrite: NEGATIVE
PH: 5 (ref 5.0–8.0)
Protein, ur: NEGATIVE mg/dL
Specific Gravity, Urine: 1.029 (ref 1.005–1.030)
Urobilinogen, UA: 0.2 mg/dL (ref 0.0–1.0)

## 2015-02-13 LAB — I-STAT CG4 LACTIC ACID, ED: Lactic Acid, Venous: 1.28 mmol/L (ref 0.5–2.0)

## 2015-02-13 LAB — AMYLASE: Amylase: 36 U/L (ref 28–100)

## 2015-02-13 LAB — URINE MICROSCOPIC-ADD ON

## 2015-02-13 MED ORDER — SODIUM CHLORIDE 0.9 % IV BOLUS (SEPSIS)
500.0000 mL | Freq: Once | INTRAVENOUS | Status: AC
  Administered 2015-02-13: 500 mL via INTRAVENOUS

## 2015-02-13 MED ORDER — PANTOPRAZOLE SODIUM 40 MG PO TBEC: 40.0000 mg | DELAYED_RELEASE_TABLET | Freq: Every day | ORAL | Status: DC

## 2015-02-13 NOTE — ED Notes (Signed)
Patient transported to X-ray 

## 2015-02-13 NOTE — ED Provider Notes (Signed)
CSN: [id number]     Arrival date & time 02/13/15  1327 History   First MD Initiated Contact with Patient 02/13/15 1533     Chief Complaint  Patient presents with  . Abdominal Pain  . Arm Injury    HPI  Patient presents with concern of left-sided abdominal pain, radiating to the left shoulder. Patient was in her usual state of health until this morning, approximately 8 hours ago, which had several episodes of sharp, crampy, uncomfortable sensation in the left lateral abdomen, with bursts of pain concurrently in the left shoulder, but not with pain in the left chest. There was no clear precipitant, and no clear relieving or exacerbating factors.  No dyspnea, lightheadedness, syncope, nausea, vomiting, diarrhea. Patient has no new medication, no new activity. Patient is a notable history of gastric bypass 8 months ago, has lost 100 pounds.   Past Medical History  Diagnosis Date  . Hypertension   . Hyperlipidemia   . Hypothyroidism   . Headache(784.0)   . GERD (gastroesophageal reflux disease)     occasional  . History of hiatal hernia   . Depression November 19, 2001  . Claustrophobia   . Diabetes mellitus without complication    Past Surgical History  Procedure Laterality Date  . Cesarean section  1991    at [place]  . Colonoscopy N/A 02/21/2013    Procedure: COLONOSCOPY;  Surgeon: Inda Castle, MD;  Location: [facility];  Service: Endoscopy;  Laterality: N/A;  . Breath tek h pylori N/A 01/14/2014    Procedure: BREATH TEK H PYLORI;  Surgeon: Edward Jolly, MD;  Location: [facility] ENDOSCOPY;  Service: General;  Laterality: N/A;  . Cholecystectomy    . Gastric roux-en-y N/A 07/13/2014    Procedure: LAPAROSCOPIC ROUX-EN-Y GASTRIC BYPASS WITH UPPER ENDOSCOPY,umbilical hernia repair;  Surgeon: Excell Seltzer, MD;  Location: [facility] ORS;  Service: General;  Laterality: N/A;   Family History  Problem Relation Age of Onset  . Colon cancer Father   . Breast cancer Mother 68    died 11/20/2010   History   Substance Use Topics  . Smoking status: Former Smoker    Types: Cigarettes  . Smokeless tobacco: Former Systems developer    Quit date: 08/07/2012     Comment: none in 2 weeks  . Alcohol Use: No   OB History    No data available     Review of Systems  Constitutional:       Per HPI, otherwise negative  HENT:       Per HPI, otherwise negative  Respiratory:       Per HPI, otherwise negative  Cardiovascular:       Per HPI, otherwise negative  Gastrointestinal: Negative for vomiting.  Endocrine:       Negative aside from HPI  Genitourinary:       Neg aside from HPI   Musculoskeletal:       Per HPI, otherwise negative  Skin: Negative.   Neurological: Negative for syncope.      Allergies  Ciprofloxacin  Home Medications   Prior to Admission medications   Medication Sig Start Date End Date Taking? Authorizing Provider  acetaminophen (TYLENOL) 500 MG tablet Take 500-1,000 mg by mouth every 6 (six) hours as needed for headache.    Historical Provider, MD  cyanocobalamin 500 MCG tablet Take 1 tablet (500 mcg total) by mouth daily. 08/30/14   Leeanne Rio, MD  doxycycline (VIBRA-TABS) 100 MG tablet Take 1 tablet (100 mg total) by mouth 2 (  two) times daily. Patient not taking: Reported on 10/14/2014 09/23/14   Coral Spikes, MD  folic acid (FOLVITE) 1 MG tablet Take 1 tablet (1 mg total) by mouth daily. 08/30/14   Leeanne Rio, MD  levothyroxine (SYNTHROID, LEVOTHROID) 125 MCG tablet TAKE ONE TABLET BY MOUTH ONCE DAILY BEFORE BREAKFAST 01/25/15   Coral Spikes, MD  lovastatin (MEVACOR) 20 MG tablet Take 40 mg by mouth at bedtime.    Historical Provider, MD  Multiple Vitamins-Minerals (MULTIVITAMIN) tablet Take 1 tablet by mouth daily. 08/30/14   Leeanne Rio, MD  pantoprazole (PROTONIX) 40 MG tablet Take 1 tablet (40 mg total) by mouth daily. Patient not taking: Reported on 10/14/2014 08/30/14   Leeanne Rio, MD  promethazine (PHENERGAN) 12.5 MG tablet Take 0.5 tablets  (6.25 mg total) by mouth every 8 (eight) hours as needed for nausea or vomiting. Patient not taking: Reported on 10/14/2014 08/30/14   Leeanne Rio, MD  thiamine 100 MG tablet Take 1 tablet (100 mg total) by mouth daily. Patient not taking: Reported on 10/14/2014 08/30/14   Leeanne Rio, MD   BP 133/82 mmHg  Pulse 78  Temp(Src) 97.9 F (36.6 C) (Oral)  Resp 16  Ht 5\' 2"  (1.575 m)  Wt 196 lb 6.4 oz (89.086 kg)  BMI 35.91 kg/m2  SpO2 100% Physical Exam  Constitutional: She is oriented to person, place, and time. She appears well-developed and well-nourished. No distress.  HENT:  Head: Normocephalic and atraumatic.  Eyes: Conjunctivae and EOM are normal.  Cardiovascular: Normal rate and regular rhythm.   Pulmonary/Chest: Effort normal and breath sounds normal. No stridor. No respiratory distress.  Abdominal: She exhibits no distension. There is no tenderness. There is no rebound and no guarding.  Musculoskeletal: She exhibits no edema.  Neurological: She is alert and oriented to person, place, and time. No cranial nerve deficit.  Skin: Skin is warm and dry.  Psychiatric: She has a normal mood and affect.  Nursing note and vitals reviewed.   ED Course  Procedures (including critical care time) Labs Review Labs Reviewed  LIPASE, BLOOD - Abnormal; Notable for the following:    Lipase 18 (*)    All other components within normal limits  HEPATIC FUNCTION PANEL - Abnormal; Notable for the following:    Albumin 3.4 (*)    All other components within normal limits  I-STAT CHEM 8, ED - Abnormal; Notable for the following:    BUN 23 (*)    Glucose, Bld 124 (*)    All other components within normal limits  AMYLASE  CBC WITH DIFFERENTIAL/PLATELET  URINALYSIS, ROUTINE W REFLEX MICROSCOPIC (NOT AT [facility])  I-STAT CG4 LACTIC ACID, ED    Imaging Review Dg Chest 2 View  02/13/2015   CLINICAL DATA:  Left-sided chest pain pain this morning.  EXAM: CHEST  2 VIEW  COMPARISON:   01/28/2014  FINDINGS: The heart size and mediastinal contours are within normal limits. Both lungs are clear. The visualized skeletal structures are unremarkable.  IMPRESSION: No active cardiopulmonary disease.   Electronically Signed   By: Earle Gell M.D.   On: 02/13/2015 16:54     EKG Interpretation   Date/Time:  Saturday February 13 2015 16:12:19 EDT Ventricular Rate:  57 PR Interval:  132 QRS Duration: 94 QT Interval:  425 QTC Calculation: 414 R Axis:   64 Text Interpretation:  Sinus rhythm Low voltage, precordial leads Sinus  rhythm T wave abnormality Artifact Abnormal ekg Confirmed by Tereso Unangst,  Kadijah Shamoon  MD [phone]) on 02/13/2015 4:18:48 PM     Cardiac 75 sinus normal Pulse ox 100% room air normal  6:38 PM On repeat exam the patient is awake and alert. We discussed all findings. She'll follow-up with her physician, understand return precautions.  MDM  Patient presents after several episodes of nonsustained left-sided abdominal pain, with episodic left shoulder pain. Patient had no evidence for ongoing coronary ischemia, infection, pulmonary embolism, infection in general. Patient was calm throughout, with no additional recurrences of pain. I discussed with the patient and her mother possibility of early infection versus other occult pathology, and the patient voiced an understanding of return precautions. With reassuring findings, patient discharged.  Carmin Muskrat, MD 02/13/15 1840

## 2015-02-13 NOTE — Discharge Instructions (Signed)
As discussed, your evaluation today has been largely reassuring.  But, it is important that you monitor your condition carefully, and do not hesitate to return to the ED if you develop new, or concerning changes in your condition.  Otherwise, please follow-up with your physician for appropriate ongoing care.   Abdominal Pain Many things can cause belly (abdominal) pain. Most times, the belly pain is not dangerous. Many cases of belly pain can be watched and treated at home. HOME CARE   Do not take medicines that help you go poop (laxatives) unless told to by your doctor.  Only take medicine as told by your doctor.  Eat or drink as told by your doctor. Your doctor will tell you if you should be on a special diet. GET HELP IF:  You do not know what is causing your belly pain.  You have belly pain while you are sick to your stomach (nauseous) or have runny poop (diarrhea).  You have pain while you pee or poop.  Your belly pain wakes you up at night.  You have belly pain that gets worse or better when you eat.  You have belly pain that gets worse when you eat fatty foods.  You have a fever. GET HELP RIGHT AWAY IF:   The pain does not go away within 2 hours.  You keep throwing up (vomiting).  The pain changes and is only in the right or left part of the belly.  You have bloody or tarry looking poop. MAKE SURE YOU:   Understand these instructions.  Will watch your condition.  Will get help right away if you are not doing well or get worse. Document Released: 01/10/2008 Document Revised: 07/29/2013 Document Reviewed: 04/02/2013 [facility] Patient Information 2015 [place], [place]. This information is not intended to replace advice given to you by your health care provider. Make sure you discuss any questions you have with your health care provider.

## 2015-02-13 NOTE — ED Notes (Signed)
Pt. Stated, I started having left side pain no injury this morning and it goes into my upper arm.

## 2015-02-19 ENCOUNTER — Ambulatory Visit (INDEPENDENT_AMBULATORY_CARE_PROVIDER_SITE_OTHER): Payer: 59 | Admitting: Family Medicine

## 2015-02-19 ENCOUNTER — Encounter: Payer: Self-pay | Admitting: Family Medicine

## 2015-02-19 VITALS — BP 132/67 | HR 73 | Temp 98.2°F | Ht 62.0 in | Wt 193.0 lb

## 2015-02-19 DIAGNOSIS — K219 Gastro-esophageal reflux disease without esophagitis: Secondary | ICD-10-CM | POA: Diagnosis not present

## 2015-02-19 NOTE — Progress Notes (Signed)
   Subjective:   Gail Cruz is a 54 y.o. female with a history of HTN, hypothyroidism, T2 DM, HLD, morbid obesity, status post gastric bypass here for ED follow-up of left upper quadrant abdominal pain.  LUQ pain: - patient seen in ED on 7/9 for LUQ abd pain that intermittently radiated to L shoulder - patient reports that pain is getting better (99% better) - now intermittent small pains with eating - taking protonix 40mg  daily - was told that it was GERD - Eat small meals status post gastric bypass - Does not drink coffee or sodas  Review of Systems:  Per HPI. All other systems reviewed and are negative.   PMH, PSH, Medications, Allergies, and FmHx reviewed and updated in EMR.  Social History: non smoker  Objective:  BP 132/67 mmHg  Pulse 73  Temp(Src) 98.2 F (36.8 C) (Oral)  Ht 5\' 2"  (1.575 m)  Wt 193 lb (87.544 kg)  BMI 35.29 kg/m2  Gen:  54 y.o. female in NAD HEENT: NCAT, MMM, EOMI, PERRL, anicteric sclerae CV: RRR, no MRG Resp: Non-labored, CTAB, no wheezes noted Abd: Soft, NTND, BS present, no guarding or organomegaly Ext: WWP, no edema Neuro: Alert and oriented, speech normal     Assessment:     Gail Cruz is a 54 y.o. female here for GERD follow-up.    Plan:     See problem list for problem-specific plans.   Gail Crews, MD PGY-2,  [facility] 02/19/2015  4:00 PM

## 2015-02-19 NOTE — Patient Instructions (Signed)
Nice to meet you today. Continue take the Protonix daily for acid reflux. Come back to see me again next month to follow-up on your thyroid and other blood work.  Take care, Dr. Jacinto Reap

## 2015-02-19 NOTE — Assessment & Plan Note (Signed)
Patient reports the pain is 99% better  Continue Protonix 40 mg daily

## 2015-02-25 ENCOUNTER — Other Ambulatory Visit: Payer: Self-pay | Admitting: Family Medicine

## 2015-03-26 ENCOUNTER — Other Ambulatory Visit: Payer: Self-pay | Admitting: Family Medicine

## 2015-03-26 NOTE — Telephone Encounter (Signed)
Provider is in a different office now, please advise refill.  Thanks [employer]

## 2015-04-06 ENCOUNTER — Encounter: Payer: Self-pay | Admitting: Family Medicine

## 2015-04-06 ENCOUNTER — Ambulatory Visit (INDEPENDENT_AMBULATORY_CARE_PROVIDER_SITE_OTHER): Payer: 59 | Admitting: Family Medicine

## 2015-04-06 VITALS — BP 118/59 | HR 70 | Temp 98.3°F | Ht 62.0 in | Wt 186.4 lb

## 2015-04-06 DIAGNOSIS — E039 Hypothyroidism, unspecified: Secondary | ICD-10-CM | POA: Diagnosis not present

## 2015-04-06 DIAGNOSIS — L732 Hidradenitis suppurativa: Secondary | ICD-10-CM | POA: Diagnosis not present

## 2015-04-06 LAB — TSH: TSH: 0.065 u[IU]/mL — ABNORMAL LOW (ref 0.350–4.500)

## 2015-04-06 NOTE — Progress Notes (Signed)
   Subjective:   Gail Cruz is a 54 y.o. female with a history of hypothyroidism, gastric bypass, HTN, GERD here for hypothyroidism f/u, cysts.  Hypothyroidism - takes medication everyday at same time, no missed doses - denies heat/cold intolerance, diarrhea/constipation, anxiety, palpitations, skin/hair changes - she does report intermittent hot flashes and irregular periods as she believes she is perimenopausal  Bumps in axilla, groin, under breasts - grow and then burst and clear liquid comes out - There are somewhat tender to palpation, especially the one in her inguinal crease - Reports that her father had similar cysts throughout his life - Has had them once before but ignored them - Denies any fevers, chills, redness, purulent discharge  Review of Systems:  Per HPI. All other systems reviewed and are negative.   PMH, PSH, Medications, Allergies, and FmHx reviewed and updated in EMR.  Social History: non smoker  Objective:  BP 118/59 mmHg  Pulse 70  Temp(Src) 98.3 F (36.8 C) (Oral)  Ht 5\' 2"  (1.575 m)  Wt 186 lb 6.4 oz (84.55 kg)  BMI 34.08 kg/m2  LMP 03/08/2015  Gen:  54 y.o. female in NAD HEENT: NCAT, MMM, EOMI, anicteric sclerae CV: RRR, no MRG Resp: Non-labored, CTAB, no wheezes noted Ext: WWP, no edema Skin: small 1cm indurated areas in R axilla, between breasts.  No fluctuance, no surrounding erythema, mildly TTP, no discharge expressed Gyn: Patient defers examination of inguinal crease at this time Neuro: Alert and oriented, speech normal    Lab Results  Component Value Date   TSH 0.799 10/09/2014   Assessment & Plan:     Gail Cruz is a 54 y.o. female here for hypothyroidism, hydradenitis.   Hypothyroidism TSH today Continue current dose Synthroid 172mcg daily pending TSH  Hydradenitis Does not look inflamed or infected currently Will apply warm compresses and keep area dry and clean F/u in 2-3 weeks if not improving  Consider Clinda  gel at that time.     Virginia Crews, MD MPH PGY-2,  [facility] 04/06/2015  2:38 PM

## 2015-04-06 NOTE — Patient Instructions (Addendum)
Thank you for coming in today,  The cysts are most likely hydradenitis suppurativa. Warm compresses, keeping them cool and dry and clean are the best things you can do. You can change your underwear a couple of times a day to let the areas breathe. Read more below We will see how this helps but come back in 2-3 weeks if it doesn't help or if the cysts are bothering you. Make an appointment for a physical in 2-3 months. We will call you if your Thyroid test is abnormal, if it is normal you will get a letter.   Hidradenitis suppurativa is a long-term (chronic) skin condition. The cause is unknown. Inflammation of the apocrine sweat gland-bearing areas leads to painful and recurrent boils and abscesses. An abscess is a collection of pus. Suppuration means formation or discharge of pus. These areas (commonly the armpits and groin) leak pus and are difficult to heal. Eventually, scarring occurs. Treatments include antibiotic medicines, surgical drainage of the abscesses and, sometimes, surgical removal of the affected skin areas. The skin and sweat glands  There are two different types of sweat gland in the skin on the human body. These are: Apocrine sweat glands.  Eccrine (or merocrine) sweat glands. Apocrine sweat glands These are larger than the eccrine sweat glands, and found only in the skin on certain areas of the body. These areas include the underarms (axillae); under the breasts and around the nipples; and in the groin and genital region. The main difference between apocrine and the more common eccrine sweat glands is that apocrine glands release their fluid (secretions) into the hair follicles, rather than directly on to the skin. The secretions are a thick, milky fluid, which can easily be turned into smelly body odour by germs (bacteria). The secretions from apocrine glands do not appear to have any specific function. It contains pheromones which are chemicals that are supposed to help humans  find a mate! The apocrine glands only start working at puberty, due to the action of the hormone changes that occur at this time. Eccrine (merocrine) sweat glands There are many more of these smaller sweat glands. They are found all over the body, with the largest numbers being found on the palms of the hands, the soles of the feet, and the forehead. These sweat glands release sweat directly on to the skin surface through pores. They are located in the skin, but not as deep as the apocrine glands. This sweat is watery and clear, and contains various salts, and other waste chemicals the body needs to get rid of. The sweat helps to cool [place] down and is also secreted as an emotional response - for example, when anxious or stressed. The nervous system controls both types of sweat glands. However, each type responds via different nerve fibres and different chemical messengers (neurotransmitters).  What is hidradenitis suppurativa? Hidradenitis suppurativa is a long-term (chronic) inflammatory skin disease with recurrent boil-like lumps. These boils often get bigger, and turn into collections of pus (abscesses). The abscesses leak pus and become difficult to heal. The problem affects only areas of the skin containing apocrine sweat glands. Commonly, the problem affects the groin and armpits. Other areas are sometimes affected such as under the breasts, the vulva, the scrotum, the buttocks and the perineum. (The perineum is the area of skin between the anus and, either the vulva in a woman, or the scrotum, in a man.) Women tend to develop it more commonly in the armpits, groin and under the  breasts. Men more commonly develop disease that affects the perineal and perianal (around the anus) skin. The wounds caused by the boils and abscesses heal poorly with scarring. In severe cases, the pus tunnels down under the skin surface. The tunnels (channels) formed are called sinus tracts. Multiple areas of hidradenitis  can become linked under the skin surface, by a network of interconnected sinus tracts. This means the inflammation (and sometimes infection) travels deeper and becomes more widespread. The eventually healed areas are full of thick scar tissue. The scarring can be as unsightly as the discharging wounds themselves. Is hidradenitis suppurativa known by any other names? Hidradenitis suppurativa is known by several other names, which can cause confusion. These are: Acne inversa  Apocrine acne  Acne conglobata  Verneuil's disease  Apocrinitis  Velpeau's disease What causes hidradenitis suppurativa? The cause is not well understood. It is thought to happen due to blockage of the hair follicles on the skin, or the sweat gland openings themselves. This blockage could be from sweat itself, or skin secretions, such as sebum from the sebaceous glands. The blocked sweat gland continues to make sweat. The sweat cannot escape on to the skin surface and so is forced deeper into surrounding tissue. Germs (bacteria) that normally live on the skin surface may have been trapped in the blocked gland or hair follicle. The bacteria can multiply in warm moist surroundings. As the sweat is forced back deeper into the tissues, it takes with it the bacteria. This leads to inflammation, and sometimes infection. This is how the hard boil-like lumps are thought to form to start with. As the problem gets worse, abscesses, which contain pus, develop. It may also be that the sweat glands in some people don't develop correctly and completely. These glands might not allow the sweat they make to reach the skin surface. Instead, the sweat is trapped and travels into the surrounding tissues. Who gets hidradenitis suppurativa? Between 1 and 4 in every 100 people in the [place] have hidradenitis suppurativa, meaning it is quite common. Many people will have very mild problems with it. Hidradenitis suppurativa usually affects people between  puberty and middle age. It is three times more common in women than in men. It is rare in Asian people, and far more common in Caucasians (white-skinned people) or Afro-Caribbean people. Hidradenitis suppurativa only develops after puberty. This is because the sweat glands are activated by hormones called sex hormones, the levels of which increase during puberty. The problem tends to improve for women if they take the combined oral contraceptive pill (COCP) - the 'Pill', or if they are pregnant. It rarely occurs after the menopause. These things all suggest that hormones play a part in causing this disease. The disease can run in families (about 1 in 3 cases) but the exact pattern of inheritance is not known. Hidradenitis suppurativa is more common in obese people and in cigarette smokers. Obesity and smoking are not direct causes. However, they can be thought of as risk factors. Hidradenitis suppurativa also seems more common in people with acne and hirsutism (excessive abnormal hairiness, especially in women). What are the signs and symptoms of hidradenitis suppurativa? Hidradenitis suppurativa usually starts with a single inflamed, boil-like, firm, raised skin lump (nodule). Sometimes this stage can result in itching, but usually there is discomfort. The nodule either slowly disappears (between 10 and 30 days) or remains (persists) to become a draining (suppurative) collection of pus (abscess). Abscesses are usually very painful. Eventually, healing occurs but the  affected skin is permanently affected, leaving deep scarring. In more severe disease the affected areas spread. Either single or multiple abscesses occur. The formation of tunnels (channels), called sinus tracts, causes the overlying skin to feel hard and lumpy (indurated). A staging system (Hurley's staging) can be used to describe the severity of the disease: Stage 1 - here there are either single or multiple areas affected, but the abscesses  are separate from one another. There is no scarring or sinus tract formation.  Stage 2 - involves recurrent abscesses which can be single or multiple. Although there are sinus tracts, the affected areas are usually widely separated.  Stage 3 - generally, large areas are affected with multiple interconnected sinus tracts and abscesses. For some people, the disease is extremely distressing, with a constant succession of new nodules and abscesses forming as soon as older ones have finally healed. Do I need any tests to diagnose hidradenitis suppurativa? There are no tests used to diagnose hidradenitis suppurativa. The diagnosis is usually based on the typical signs and symptoms that a person may have. Sometimes hidradenitis suppurativa is confused with other similar-looking skin conditions such as common boils, collections of pus (abscesses), skin infections and ingrowing hairs. Other diseases can cause tunnels (channels) known as sinus tracts - for example, Crohn's disease. Tests might be needed to exclude these other conditions, though they often have many other symptoms. Sometimes, if there are signs of infection, small samples (swabs) can be taken. This is to see what germs (bacteria) are growing in the pus. This can help in deciding whether antibiotic medicines (and which ones) should be used. Occasionally, it might be helpful to test your blood for sugar (glucose) to make sure you do not have diabetes. This is because skin infections are more common in people with diabetes. Your doctor might also take blood to make sure you are not anaemic and to monitor the level of infection or inflammation. Scans, such as CT scans, are not needed to diagnose the condition. They may, however, be used in very severe disease, to plan surgery, as it is important to know where the sinus tracts go, and how deep they are. What is the treatment for hidradenitis suppurativa? General advice Try to lose weight if you are  obese, and stop smoking if you smoke. Also, the following may help relieve some of your symptoms: Wear loose-fitting cotton clothing.  Wash the affected areas carefully and gently, preferably using an antibacterial or antiseptic soap or shower gel. This is to try to get rid of germs (bacteria) on the skin. (Note: it is normal to have bacteria harmlessly living on the skin.)  Avoid shaving affected areas, such as the underarms.  Avoid using deodorants and antiperspirants if the underarms are affected, and avoid perfumes on affected areas too.  You can use a hot flannel (as hot as you can bear), to hold against affected areas and encourage the collections of pus (abscesses) to come to a 'point', so that they start draining. A tense hard abscess that has not burst is more painful than one where the pus is draining out.  Try to minimise heat exposure and sweating. This might mean avoiding sitting next to the fireplace, or avoiding intense exercise in the gym. Medical treatment It is difficult to control hidradenitis suppurativa with medical treatment. The aim is to catch the disease in its early stages, and treat and control milder forms of the disease. Medical treatment means using medicines, either on the skin (topically),  or by mouth. Examples of medical treatment include: Topical anti-acne antibiotics and benzoyl peroxide. The antibiotics kill bacteria on the skin whilst the benzoyl peroxide is an antibacterial agent and also leads to drying of the skin. Benzoyl peroxide also encourages the skin to shed its dead surface, which can help stop the blockage of pores.  Short courses of antibiotic tablets can be used when there are new abscesses. The aim is to try to stop the infection from spreading and to help the abscess heal more quickly. Generally a short course of antibiotics will last for two weeks.  Prolonged courses of antibiotic tablets. These are usually used for their anti-inflammatory action.    Trial of the COCP (the 'Pill') can be used. A trial of up to 12 months may be needed before deciding whether the skin has improved. Some COCPs (such as Dianette or Yasmin) might be better than others. They counteract some of the more 'female' hormone effects such as skin oiliness and spots. Contraceptive pills are, of course, only suitable for women. Not all women can safely use the Pill, as contraceptive pills can have serious side-effects in some women. Your GP can discuss with you whether it is safe to use the Pill.  Retinoids. Acitretin (Neotigason) is a retinoid which has shown promising results. It may be used when other medicines have not been helpful. These are vitamin A-based medicines and should only be prescribed by a skin specialist (dermatologist). These medicines work by stopping the secretion of sebum from sebaceous glands. They also help the normal shedding of dead skin cells in the hair follicles, preventing pore blockage. It must not be taken in pregnancy due to the risk of birth defects.  Corticosteroid tablets (steroids), such as prednisolone, may be used in short courses to reduce inflammation. Long courses of steroids can cause serious side-effects such as 'thinning' of the bones (osteoporosis), weight gain, high blood pressure, cataracts and mental health problems. (See separate leaflet called Oral Steroids for more details.)  Medicines that affect the immune system have been used with some success. These medicines can only be prescribed by a specialist (such as a dermatologist) and your treatment must be carefully monitored. This is because there are potentially very serious side-effects such as kidney (renal) failure, and low blood counts (which can make you at risk of an overwhelming infection). Examples of medicines used are:  Anti-TNF medicines such as infliximab (Remicade).  Dapsone (usually used to treat leprosy; used in hidradenitis suppurativa for its anti-inflammatory  action). Some of these new medicines are very expensive. The NHS limits the use of these medicines to certain medical conditions, such as inflammatory bowel disease, rheumatoid arthritis and psoriasis. Use in hidradenitis suppurativa would be 'off-licence' and only likely to be available on a private basis. Surgical treatment Long-standing hidradenitis suppurativa usually requires surgery. Generally, this would be performed under general anaesthetic. The surgical procedure chosen depends on the grade or extent of the hidradenitis suppurativa. Surgical treatments include: Incision and drainage - this means piercing (lancing) a tense, hard abscess and allowing the pus to drain out. This is most appropriate for grade 1 hidradenitis suppurativa, and a course of antibiotic tablets would usually be given afterwards.  Wide-scale removal (excision) of affected areas - this can be used for grade 2 and 3 disease. For stage 2 disease, the sinus tracts are surgically removed. In stage 3 disease, the operation needs to be more extensive, as the tracts and scarring go deeper and larger areas are involved.  This means a lot of scarred, infected tissue has to be removed. Often skin grafts and other plastic surgery techniques are needed.  Carbon dioxide laser treatment can be used as an alternative to conventional surgery, where available, and dependent on the severity of the disease. It is quite a new treatment. The diseased tissue is 'vapourised' leaving an open wound which is left to heal. Admission to hospital overnight is not normally needed. Other treatments Radiotherapy has been used to treat hidradenitis suppurativa, but this is not common. Are there any complications from having hidradenitis suppurativa? The main complication is scarring of the skin and deeper tissues. In severe cases, this can cause swelling of the arm (if the armpit was affected) or of the leg (if the groin was affected). This is called  lymphoedema. This means that the fluid (lymph) drainage from the limb is affected and the fluid builds up, causing the swelling. It is a difficult problem to treat and cure; often, tight elastic compression garments have to be worn long-term. Other complications include: A general feeling of being unwell and tired (malaise).  Depression.  Long-term (chronic) infection leading to problems such as anaemia, kidney failure and low levels of protein in the blood.  Joint pains and inflammation (arthropathy).  Skin cancer. This is very rare and has been reported in very severe long-term hidradenitis suppurativa.  Fistula formation. A fistula occurs when channels, called sinus tracts, tunnel into other parts of the body, such as the bowel or bladder (this is extremely rare). What is the long-term outlook (prognosis)? The prognosis is very variable. Not everyone progresses from stage 1 to stage 3. For many affected people, hidradenitis suppurativa is a painful and debilitating condition. It has a tendency to flare up regularly, gradually causing more problems. Deep scarring and formation of tunnels (channels), called sinus tracts, are not uncommon. Some people have mild (stage 1) disease only. Early surgical treatment can (in some cases) cure the disease and stop it from returning. In rare cases, the condition goes away on its own without treatment. Hidradenitis suppurativa is seldom a fatal condition unless there is an overwhelming infection in an immunocompromised person (that is, someone with an abnormal immune system). The condition can prevent normal working and social activities (for example, swimming). Psychological problems are common, as are difficulties in sexual relationships. These problems can either be directly due to the pain and messiness of the condition or to embarrassment and body image problems.

## 2015-04-06 NOTE — Progress Notes (Signed)
Subjective:     Patient ID: Gail Cruz, female   DOB: 1960-12-22, 54 y.o.   MRN: [id number]  HPI Gail Cruz is a 54 y.o F with a PMHx of HTN, Gastric bypass, and Hypothyroidism.   HYPOTHYROIDISM: Patient has been taking her medications per schedule. She has been having hot flashes due to her entering menopause. She has also had some irregular periods. She denies anxiety/depression, tremor, diarrhea/constipaiton, hot/cold intolerance.   CYSTS: Patient complains of cysts that burst and release clear fluid under her arms, in between her breasts and in her left inguinal crease. They are bothering her, they become painful and the one in her inguinal crease bleeds. Her father had similar cysts. She denies and fevers/chills, puss, erythema in the areas under her arms but cannot see her inguinal creases.   Review of Systems Pertinent ROS as per HPI     Objective:   Physical Exam  Gen: NAD, well appearing CV: RRR, nl S1 S2 no m/r/g PULM: CTAB, NWOB, no w/c/r GI: NABS, s/nt/nd no organomegaly.  NEURO: AOx3, normal ROM, no tremor SKIN: Hard cystic masses scattered in axillary areas, and in between breasts. No signs of infection erythema or puss noted. Cysts are tender to palpation. Cyst in inguinal crease not examined, patient wants to save it for another visit.       Assessment and Plan   HYPOTHYROIDISM: Well controlled patient expresses no symptoms of hypo or hyper thyroidism. Patient states she is compliant with her medications not having trouble/ side effects of them. - TSH  - continue current regimen pending TSH level will contact if abnml results.   CYSTS: Most likely Hydradenitis suppurativa due to distrbution, FHx and appearance.  - Try conservative treatment first with, keeping it dry, applying warm compresses and keeping them clean.  - Patient to make an appointment if not better in 2-3 weeks.   - Follow up with pt in 2-3 months for general physical is they do not make appointment  sooner.

## 2015-04-06 NOTE — Assessment & Plan Note (Signed)
Does not look inflamed or infected currently Will apply warm compresses and keep area dry and clean F/u in 2-3 weeks if not improving  Consider Clinda gel at that time.

## 2015-04-06 NOTE — Assessment & Plan Note (Signed)
TSH today Continue current dose Synthroid 110mcg daily pending TSH

## 2015-04-07 ENCOUNTER — Telehealth: Payer: Self-pay | Admitting: Family Medicine

## 2015-04-07 DIAGNOSIS — E039 Hypothyroidism, unspecified: Secondary | ICD-10-CM

## 2015-04-07 MED ORDER — LEVOTHYROXINE SODIUM 112 MCG PO TABS: 112.0000 ug | ORAL_TABLET | Freq: Every day | ORAL | Status: DC

## 2015-04-07 NOTE — Telephone Encounter (Signed)
Called patient to report low TSH of 0.065.  No answer, left VM asking patient to call clinic to discuss results.  Please tell patient when she calls that TSH low.  Need to decrease Synthroid dose to 169mcg daily.  New prescription sent to pharmacy.  Will repeat TSH in about 6 weeks to see if new dose is appropriate.  Virginia Crews, MD, MPH PGY-2,  [facility] 04/07/2015 9:38 AM

## 2015-04-07 NOTE — Assessment & Plan Note (Addendum)
TSH low at 0.065 Decrease Synthroid dose to 134mcg daily as patient not sufficiently supplemented previously on 180mcg and TSH too low on 187mcg daily.

## 2015-04-08 NOTE — Telephone Encounter (Signed)
Patient informed, expressed understanding. 

## 2015-04-14 ENCOUNTER — Encounter: Payer: Self-pay | Admitting: Dietician

## 2015-04-14 ENCOUNTER — Encounter: Payer: 59 | Attending: General Surgery | Admitting: Dietician

## 2015-04-14 VITALS — Ht 61.0 in | Wt 185.0 lb

## 2015-04-14 DIAGNOSIS — Z6841 Body Mass Index (BMI) 40.0 and over, adult: Secondary | ICD-10-CM | POA: Insufficient documentation

## 2015-04-14 DIAGNOSIS — Z713 Dietary counseling and surveillance: Secondary | ICD-10-CM | POA: Insufficient documentation

## 2015-04-14 NOTE — Progress Notes (Signed)
Follow-up visit: 9 Months Post-Operative RYGB Surgery  Medical Nutrition Therapy:  Appt start time: 1350 end time: 1430  .  Primary concerns today: Post-operative Bariatric Surgery Nutrition Management. Returns with a 17.5 lb weight loss since last visit. Was having bad acid reflux in July and is taking medication for it. Feeling good.   A few weeks ago had a cheeseburger and small coke, which was the first time she had a hamburger since having surgery. Did not finish soda.   Having some bread about once per week and triscuits (most days).   Surgery date: 07/13/2014 Surgery type: RYGB Start weight at [facility]: 292.5 lbs on 05/29/2014 Weight today: 185.0 lbs  Weight change: 17.5 lbs Total weight loss: 107.5 lbs Goal: 125-130 lbs, original goal was 150 lbs  TANITA  BODY COMP RESULTS  06/29/14 07/28/14 09/09/14 10/14/14 01/13/15 04/14/15   BMI (kg/m^2) 52 48 47.7 43.5 38.3 35.0   Fat Mass (lbs) 148.5 143.0 115.5 115.0 88.5 74.0   Fat Free Mass (lbs) 126.5 111.0 137.5 115.0 114.0 111.0   Total Body Water (lbs) 92.5 81.5 100.5 84.0 83.5 81.5    Preferred Learning Style:   No preference indicated   Learning Readiness:   Ready  24-hr recall: B (8AM): egg with 2 pieces of [place] bacon with tomato or spam and egg (10 g) Snk (AM): banana or grapes sargento cheese snacks or pickles (0-7 g) L (12PM): 2-3 oz chicken/pork chop/beans with sauteed vegetables sometimes with yams and slaw or chicken/tuna salad with egg with fat free mayo with triscuits or protein bar (10-21 g) Snk (PM): 1-2 oz montery jack or chicken/tuna salad or none (0-14 g) D (7 PM): 2-3 oz chicken/pork chop/beans with sauteed vegetables sometimes with yams and slaw or chicken/tuna salad with egg with fat free mayo with triscuits protein bar (10-21 g) Snk (PM): 8  oz milk sometimes (8 g)  Fluid intake: 60 oz water, 8 oz milk, diet green tea = 64+ oz Estimated total protein intake: 49-63 g  Medications: see list Supplementation:  taking and taking biotin  Using straws: No Drinking while eating: No Hair loss: less than before Carbonated beverages: tried some Coke N/V/D/C: No Dumping syndrome: No  Recent physical activity:  Started back to walking this past weekend had stopped  Progress Towards Goal(s):  In progress.  Handouts given out during appointment: High Protein Snacks   Nutritional Diagnosis:  [place]-3.3 Overweight/obesity related to past poor dietary habits and physical inactivity as evidenced by patient w/ recent RYGB surgery following dietary guidelines for continued weight loss.    Intervention:  Nutrition education/diet reinforcement Goals:  Follow Phase 3B: High Protein + Non-Starchy Vegetables  Eat 3-6 small meals/snacks, every 3-5 hrs  Increase lean protein foods to meet 60g goal  Increase fluid intake to 64oz   Avoid drinking 15 minutes before, during and 30 minutes after eating  Aim for >30 min of physical activity daily, Continue walking 30 for 4-5 x week  Eat protein first and then non starchy vegetables, if you are still hungry add fruit, pasta, bread, or yam  If weight loss slows, cut back on fruit, bread, yams, and pasta  Look for 100% whole wheat bread and pasta  Look for reduced sugar ketchup at [employer]  Teaching Method Utilized:  Visual Auditory Hands on  Barriers to learning/adherence to lifestyle change: none  Demonstrated degree of understanding via:  Teach Back   Monitoring/Evaluation:  Dietary intake, exercise, and body weight. Follow up in 3 months  for 12 month post-op visit.

## 2015-04-14 NOTE — Patient Instructions (Addendum)
Goals:  Follow Phase 3B: High Protein + Non-Starchy Vegetables  Eat 3-6 small meals/snacks, every 3-5 hrs  Increase lean protein foods to meet 60g goal  Increase fluid intake to 64oz   Avoid drinking 15 minutes before, during and 30 minutes after eating  Aim for >30 min of physical activity daily, Continue walking 30 for 4-5 x week  Eat protein first and then non starchy vegetables, if you are still hungry add fruit, pasta, bread, or yam  If weight loss slows, cut back on fruit, bread, yams, and pasta  Look for 100% whole wheat bread and pasta  Look for reduced sugar ketchup at [place]  Surgery date: 07/13/2014 Surgery type: RYGB Start weight at [facility]: 292.5 lbs on 05/29/2014 Weight today: 185.0 lbs  Weight change: 17.5 lbs Total weight loss: 107.5 lbs Goal: 125-130 lbs, original goal was 150 lbs  TANITA  BODY COMP RESULTS  06/29/14 07/28/14 09/09/14 10/14/14 01/13/15 04/14/15   BMI (kg/m^2) 52 48 47.7 43.5 38.3 35.0   Fat Mass (lbs) 148.5 143.0 115.5 115.0 88.5 74.0   Fat Free Mass (lbs) 126.5 111.0 137.5 115.0 114.0 111.0   Total Body Water (lbs) 92.5 81.5 100.5 84.0 83.5 81.5

## 2015-05-17 ENCOUNTER — Telehealth: Payer: Self-pay | Admitting: Family Medicine

## 2015-05-17 DIAGNOSIS — E039 Hypothyroidism, unspecified: Secondary | ICD-10-CM

## 2015-05-17 NOTE — Telephone Encounter (Signed)
Ms. Kaneko called to inquire about having another thyroid test.  Last results were abnormal, but patient wasn't sent notification of this.  Scheduled an appt for her physical on 11/23.  Patient would like a call back regarding her lab results and to discuss if she will need to fast for her physical appt.   Please call her back at earliest convenience.  You can reach her on her cell or home number and leave any message with her mother.

## 2015-05-17 NOTE — Telephone Encounter (Signed)
Returned patient's call.  From 8/31 telephone encounter: Called patient to report low TSH of 0.065. No answer, left VM asking patient to call clinic to discuss results.   Please tell patient when she calls that TSH low. Need to decrease Synthroid dose to 159mcg daily. New prescription sent to pharmacy. Will repeat TSH in about 6 weeks to see if new dose is appropriate.  Nurse relayed information.  Patient concerned that she cannot get appt in time to have TSH.  Patient to call and make lab only appt later this week for TSH repeat.  I will inform her of results.  Virginia Crews, MD, MPH PGY-2,  [facility] 05/17/2015 3:29 PM

## 2015-05-21 ENCOUNTER — Ambulatory Visit (INDEPENDENT_AMBULATORY_CARE_PROVIDER_SITE_OTHER): Payer: 59

## 2015-05-21 ENCOUNTER — Other Ambulatory Visit: Payer: 59

## 2015-05-21 DIAGNOSIS — Z23 Encounter for immunization: Secondary | ICD-10-CM

## 2015-05-21 DIAGNOSIS — E039 Hypothyroidism, unspecified: Secondary | ICD-10-CM

## 2015-05-21 LAB — TSH: TSH: 0.143 u[IU]/mL — ABNORMAL LOW (ref 0.350–4.500)

## 2015-05-21 NOTE — Progress Notes (Signed)
tsh done today Gail Cruz 

## 2015-05-24 ENCOUNTER — Telehealth: Payer: Self-pay | Admitting: Family Medicine

## 2015-05-24 MED ORDER — LEVOTHYROXINE SODIUM 100 MCG PO TABS: 100.0000 ug | ORAL_TABLET | Freq: Every day | ORAL | Status: DC

## 2015-05-24 NOTE — Telephone Encounter (Signed)
Called patient about TSH result. TSH low, eaning that synthroid dose is too high.  Will decrease dose to 139mcg daily.  New Rx sent to pharmacy.  Left message asking patient to call clinic for result.  Please relay above to patient if she calls back.  Virginia Crews, MD, MPH PGY-2,  [facility] 05/24/2015 11:36 AM

## 2015-06-18 ENCOUNTER — Encounter: Payer: Self-pay | Admitting: Family Medicine

## 2015-06-18 ENCOUNTER — Ambulatory Visit (INDEPENDENT_AMBULATORY_CARE_PROVIDER_SITE_OTHER): Payer: 59 | Admitting: Family Medicine

## 2015-06-18 ENCOUNTER — Other Ambulatory Visit (HOSPITAL_COMMUNITY)
Admission: RE | Admit: 2015-06-18 | Discharge: 2015-06-18 | Disposition: A | Discharge status: Home / Self Care | Payer: 59 | Source: Ambulatory Visit | Attending: Family Medicine | Admitting: Family Medicine

## 2015-06-18 VITALS — BP 120/78 | HR 87 | Temp 98.7°F | Ht 61.0 in | Wt 178.0 lb

## 2015-06-18 DIAGNOSIS — E785 Hyperlipidemia, unspecified: Secondary | ICD-10-CM

## 2015-06-18 DIAGNOSIS — E669 Obesity, unspecified: Secondary | ICD-10-CM

## 2015-06-18 DIAGNOSIS — Z01419 Encounter for gynecological examination (general) (routine) without abnormal findings: Secondary | ICD-10-CM | POA: Insufficient documentation

## 2015-06-18 DIAGNOSIS — L732 Hidradenitis suppurativa: Secondary | ICD-10-CM | POA: Diagnosis not present

## 2015-06-18 DIAGNOSIS — E119 Type 2 diabetes mellitus without complications: Secondary | ICD-10-CM

## 2015-06-18 DIAGNOSIS — Z1151 Encounter for screening for human papillomavirus (HPV): Secondary | ICD-10-CM | POA: Insufficient documentation

## 2015-06-18 DIAGNOSIS — Z0000 Encounter for general adult medical examination without abnormal findings: Secondary | ICD-10-CM | POA: Diagnosis not present

## 2015-06-18 DIAGNOSIS — Z124 Encounter for screening for malignant neoplasm of cervix: Secondary | ICD-10-CM

## 2015-06-18 DIAGNOSIS — Z9884 Bariatric surgery status: Secondary | ICD-10-CM

## 2015-06-18 DIAGNOSIS — I10 Essential (primary) hypertension: Secondary | ICD-10-CM

## 2015-06-18 DIAGNOSIS — E039 Hypothyroidism, unspecified: Secondary | ICD-10-CM

## 2015-06-18 LAB — COMPREHENSIVE METABOLIC PANEL
ALT: 19 U/L (ref 6–29)
AST: 17 U/L (ref 10–35)
Albumin: 3.6 g/dL (ref 3.6–5.1)
Alkaline Phosphatase: 93 U/L (ref 33–130)
BUN: 16 mg/dL (ref 7–25)
CHLORIDE: 110 mmol/L (ref 98–110)
CO2: 26 mmol/L (ref 20–31)
CREATININE: 0.71 mg/dL (ref 0.50–1.05)
Calcium: 9 mg/dL (ref 8.6–10.4)
GLUCOSE: 82 mg/dL (ref 65–99)
Potassium: 5 mmol/L (ref 3.5–5.3)
Sodium: 144 mmol/L (ref 135–146)
Total Bilirubin: 0.8 mg/dL (ref 0.2–1.2)
Total Protein: 6.4 g/dL (ref 6.1–8.1)

## 2015-06-18 LAB — POCT GLYCOSYLATED HEMOGLOBIN (HGB A1C): Hemoglobin A1C: 5.5

## 2015-06-18 LAB — LDL CHOLESTEROL, DIRECT: LDL DIRECT: 118 mg/dL (ref <130)

## 2015-06-18 MED ORDER — CEPHALEXIN 500 MG PO CAPS: 500.0000 mg | ORAL_CAPSULE | Freq: Two times a day (BID) | ORAL | Status: DC

## 2015-06-18 NOTE — Assessment & Plan Note (Signed)
Pap smear today Otherwise up-to-date

## 2015-06-18 NOTE — Assessment & Plan Note (Signed)
Continue Synthroid 100 g daily TSH repeat in 2 months

## 2015-06-18 NOTE — Patient Instructions (Signed)
Nice to see you again today. Keep taking your current medications. Regarding his lab work today and someone will call you or send you a letter with the results and they're available. Take Keflex twice daily for the next 7 days to help with the infected bump. If you notice more redness or swelling in that area or started to have fevers please come back to see [place].  Take care, Dr. B  Things to do to keep yourself healthy  - Exercise at least 30-45 minutes a day, 3-4 days a week.  - Eat a low-fat diet with lots of fruits and vegetables, up to 7-9 servings per day.  - Seatbelts can save your life. Wear them always.  - Smoke detectors on every level of your home, check batteries every year.  - Eye Doctor - have an eye exam every 1-2 years  - Safe sex - if you may be exposed to STDs, use a condom.  - Alcohol -  If you drink, do it moderately, less than 2 drinks per day.  - [place]. Choose someone to speak for you if you are not able.  - Depression is common in our stressful world.If you're feeling down or losing interest in things you normally enjoy, please come in for a visit.  - Violence - If anyone is threatening or hurting you, please call immediately.

## 2015-06-18 NOTE — Assessment & Plan Note (Signed)
Given weight loss after gastric bypass, this is likely resolved Direct LDL today

## 2015-06-18 NOTE — Assessment & Plan Note (Signed)
Appears infected between breast No need for I&D as it has opened on its own and draining well Keflex 500mg  BID x7d Return precautions given

## 2015-06-18 NOTE — Progress Notes (Signed)
54 y.o. year old female presents for well woman/preventative visit and annual GYN examination.  Acute Concerns:  HTN: - Medications: none since 11/2014  T2DM - Medications: none, off metformin since 01/2014  HLD - medications: off statin since 11/2014  Hypothyroidism: - taking Synthroid 172mcg daily - reports good compliance - no diarrhea/constipation, no hair/skin changes, no palpitations  lump between breast - pus draining - TTP - no fevers - some redness  Diet: still seeing nutritionists/p gastric bypass, doesn't eat potatoes or fried foods, smaller portions  Exercise: walking 3 days/wk for 1 hr  Sexual/Birth History: G2P1011, not sexually active, 9 months or so since LMP  Birth Control: none  POA/Living Will: none  Social:  Social History   Social History  . Marital Status: Single    Spouse Name: N/A  . Number of Children: N/A  . Years of Education: N/A   Social History Main Topics  . Smoking status: Former Smoker    Types: Cigarettes  . Smokeless tobacco: Former Systems developer    Quit date: 08/07/2012     Comment: none in 2 weeks  . Alcohol Use: No  . Drug Use: No  . Sexual Activity: Not Asked   Other Topics Concern  . None   Social History Narrative    Immunization:  Tdap/TD: 01/2011  Influenza: 05/2015  Cancer Screening:  Pap Smear: 2012  Mammogram: 11/2014  Colonoscopy: 02/2013 - q5 years  Physical Exam: Filed Vitals:   06/18/15 0834  BP: 120/78  Pulse: 87  Temp: 98.7 F (37.1 C)    Body mass index is 33.65 kg/(m^2).   GEN: Obese, NAD HEENT: NCAT, MMM, OP clear, TMs clear, PERRL, EOMI CARDIAC: RRR, no m/r/g, intact distal pulses RESP: CTAB, normal WOB BREAST: 2cm indurated subcutaneous nodule between breasts, no drainage, no fluctuance, erythema, opened at tiop where patient drained previously ABD: Soft, NTND, +BS GU/GYN:Exam performed in the presence of a chaperone. External genitalia within normal limits.  Vaginal mucosa pink, moist,  normal rugae.  Nonfriable cervix without lesions, no discharge or bleeding noted on speculum exam.  Bimanual exam revealed normal, nongravid uterus.  No cervical motion tenderness. No adnexal masses bilaterally.  EXT: WWP, no edema SKIN: No rashes  ASSESSMENT & PLAN: 54 y.o. female presents for annual well woman/preventative exam and GYN exam. Please see problem specific assessment and plan.    HTN, T2DM resolved (A1c 5.5) off all medications s/p gastric bypass  Hydradenitis Appears infected between breast No need for I&D as it has opened on its own and draining well Keflex 500mg  BID x7d Return precautions given  Hypothyroidism Continue Synthroid 100 g daily TSH repeat in 2 months  Hyperlipidemia Given weight loss after gastric bypass, this is likely resolved Direct LDL today  Obesity (BMI 30.0-34.9) Patient continues to work on diet and exercise  Preventative health care Pap smear today Otherwise up-to-date   Virginia Crews, MD, MPH PGY-2,  [facility] 06/18/2015 11:59 AM

## 2015-06-18 NOTE — Assessment & Plan Note (Signed)
Patient continues to work on diet and exercise

## 2015-06-19 LAB — HIV ANTIBODY (ROUTINE TESTING W REFLEX): HIV 1&2 Ab, 4th Generation: NONREACTIVE

## 2015-06-19 LAB — HEPATITIS C ANTIBODY: HCV AB: NEGATIVE

## 2015-06-21 LAB — CYTOLOGY - PAP

## 2015-06-22 ENCOUNTER — Telehealth: Payer: Self-pay | Admitting: Family Medicine

## 2015-06-22 NOTE — Telephone Encounter (Signed)
Called patient to discuss lab results.  Mother answered the phone and reported patient cannot take calls at work.  Left message for patient to call back to clinic for lab results  Pap smear - normal and HPV negative , but not enough cells from transition zone (a special part of cervix) - would repeat in 3 years.  Electrolytes, liver and kidney function normal  HIV and Hepatitis C screenings negative  LDL (bad cholesterol) elevated from where it was previously.  We will check full cholesterol panel in February and determine whether or not to restart statin.  Continue to work on diet and weight loss.  A1c 5.5 (no diabetes)  Please relay above to patient if she returns call.  Let me know if there are any questions.  Virginia Crews, MD, MPH PGY-2,  [facility] 06/22/2015 9:16 AM

## 2015-06-23 NOTE — Telephone Encounter (Signed)
Message was delivered to pt

## 2015-06-30 ENCOUNTER — Encounter: Payer: 59 | Admitting: Family Medicine

## 2015-07-14 ENCOUNTER — Encounter: Payer: 59 | Attending: General Surgery | Admitting: Dietician

## 2015-07-14 ENCOUNTER — Encounter: Payer: Self-pay | Admitting: Dietician

## 2015-07-14 VITALS — Ht 61.0 in | Wt 177.0 lb

## 2015-07-14 DIAGNOSIS — Z6841 Body Mass Index (BMI) 40.0 and over, adult: Secondary | ICD-10-CM | POA: Diagnosis not present

## 2015-07-14 DIAGNOSIS — Z713 Dietary counseling and surveillance: Secondary | ICD-10-CM | POA: Insufficient documentation

## 2015-07-14 DIAGNOSIS — E669 Obesity, unspecified: Secondary | ICD-10-CM

## 2015-07-14 NOTE — Progress Notes (Signed)
  Follow-up visit: 12 Months Post-Operative RYGB Surgery  Medical Nutrition Therapy:  Appt start time: [id number] end time: 1440.  Primary concerns today: Post-operative Bariatric Surgery Nutrition Management. Returns with a 8 lb weight loss since last visit. Has a lot of energy and feeling really good. Has not had any more acid reflux recently.   Have a few treats now and then.Having some bread about once per week and triscuits (most days).   Surgery date: 07/13/2014 Surgery type: RYGB Start weight at [facility]: 292.5 lbs on 05/29/2014 Weight today: 177.0 lbs  Weight change: 8 lbs Total weight loss: 115.5 lbs Goal: 125-130 lbs, original goal was 150 lbs  TANITA  BODY COMP RESULTS  06/29/14 07/28/14 09/09/14 10/14/14 01/13/15 04/14/15 07/14/15   BMI (kg/m^2) 52 48 47.7 43.5 38.3 35.0 33.4   Fat Mass (lbs) 148.5 143.0 115.5 115.0 88.5 74.0 72.5   Fat Free Mass (lbs) 126.5 111.0 137.5 115.0 114.0 111.0 104.5   Total Body Water (lbs) 92.5 81.5 100.5 84.0 83.5 81.5 76.5    Preferred Learning Style:   No preference indicated   Learning Readiness:   Ready  24-hr recall: B (8AM): raisin bran crunch or Honey Nut Cheerios with milk (5 g) Snk (AM):grapes or sargento cheese or ham  (0-7 g) L (12PM): 2-3 oz chicken/pork chop/beans with sauteed vegetables sometimes with yams and slaw or chicken/tuna salad with egg with fat free mayo with triscuits or protein bar (10-21 g) Snk (PM): grapes or sargento cheese or ham  (0-7 g) D (7 PM): 2-3 oz chicken/pork chop/beans with sauteed vegetables sometimes with yams and slaw or chicken/tuna salad with egg with fat free mayo with triscuits protein bar (10-21 g) Snk (PM): 8  oz milk sometimes and sometimes tortilla with ham (8-12 g)  Fluid intake: 60 oz water, 8 oz milk, diet green tea = 64+ oz Estimated total protein intake: 49-63 g  Medications: see list Supplementation: taking and taking biotin  Using straws: Yes  Drinking while eating: Yes  Hair loss:  No Carbonated beverages: No N/V/D/C: No Dumping syndrome: No  Recent physical activity:  Walking on days off (stores)  Progress Towards Goal(s):  In progress.  Handouts given out during appointment: High Protein Snacks   Nutritional Diagnosis:  [place]-3.3 Overweight/obesity related to past poor dietary habits and physical inactivity as evidenced by patient w/ recent RYGB surgery following dietary guidelines for continued weight loss.    Intervention:  Nutrition education/diet reinforcement Goals:  Follow Phase 3B: High Protein + Non-Starchy Vegetables  Eat 3-6 small meals/snacks, every 3-5 hrs  Increase lean protein foods to meet 60g goal  Increase fluid intake to 64oz   Avoid drinking 15 minutes before, during and 30 minutes after eating  Aim for >30 min of physical activity daily  Think about walking or joining the gym in the New Year  If weight loss slows, cut back on fruit, bread, yams, cereal, crackers, sweets and pasta  Look for 100% whole wheat bread/sandwich thins (keep in freezer)   Look for reduced sugar ketchup at [employer]  Limit cereal to one day per week, make sure to have 2-3 oz of protein at lunch and dinner on those days  Teaching Method Utilized:  Visual Auditory Hands on  Barriers to learning/adherence to lifestyle change: none  Demonstrated degree of understanding via:  Teach Back   Monitoring/Evaluation:  Dietary intake, exercise, and body weight. Follow up in 4 months for 16 month post-op visit.

## 2015-07-14 NOTE — Patient Instructions (Addendum)
Goals:  Follow Phase 3B: High Protein + Non-Starchy Vegetables  Eat 3-6 small meals/snacks, every 3-5 hrs  Increase lean protein foods to meet 60g goal  Increase fluid intake to 64oz   Avoid drinking 15 minutes before, during and 30 minutes after eating  Aim for >30 min of physical activity daily  Think about walking or joining the gym in the New Year  If weight loss slows, cut back on fruit, bread, yams, cereal, crackers, sweets and pasta  Look for 100% whole wheat bread/sandwich thins (keep in freezer)   Look for reduced sugar ketchup at [employer]  Limit cereal to one day per week, make sure to have 2-3 oz of protein at lunch and dinner on those days   Surgery date: 07/13/2014 Surgery type: RYGB Start weight at [facility]: 292.5 lbs on 05/29/2014 Weight today: 177.0 lbs  Weight change: 8 lbs Total weight loss: 115.5 lbs Goal: 125-130 lbs, original goal was 150 lbs  TANITA  BODY COMP RESULTS  06/29/14 07/28/14 09/09/14 10/14/14 01/13/15 04/14/15 07/14/15   BMI (kg/m^2) 52 48 47.7 43.5 38.3 35.0 33.4   Fat Mass (lbs) 148.5 143.0 115.5 115.0 88.5 74.0 72.5   Fat Free Mass (lbs) 126.5 111.0 137.5 115.0 114.0 111.0 104.5   Total Body Water (lbs) 92.5 81.5 100.5 84.0 83.5 81.5 76.5

## 2015-08-17 ENCOUNTER — Other Ambulatory Visit: Payer: Self-pay | Admitting: Family Medicine

## 2015-08-19 ENCOUNTER — Telehealth: Payer: Self-pay | Admitting: Family Medicine

## 2015-08-19 NOTE — Telephone Encounter (Signed)
Pt called because she was told to come to have labs done to check her thyroid levels. Can we put the orders in and call and let patient so we can schedule the appointment. jw

## 2015-08-23 ENCOUNTER — Other Ambulatory Visit: Payer: Self-pay | Admitting: Family Medicine

## 2015-08-23 DIAGNOSIS — E039 Hypothyroidism, unspecified: Secondary | ICD-10-CM

## 2015-08-23 NOTE — Telephone Encounter (Signed)
Order placed for future TSH.  Patient can schedule lab appt at earliest convenience.  Please call patient to schedule appt.  Thanks!  Virginia Crews, MD, MPH PGY-2,  [facility] 08/23/2015 1:31 PM

## 2015-08-24 NOTE — Telephone Encounter (Signed)
Left message on voicemail asking that patient call back to schedule lab visit.

## 2015-08-27 ENCOUNTER — Other Ambulatory Visit: Payer: BLUE CROSS/BLUE SHIELD

## 2015-08-27 ENCOUNTER — Telehealth: Payer: Self-pay | Admitting: Family Medicine

## 2015-08-27 DIAGNOSIS — E039 Hypothyroidism, unspecified: Secondary | ICD-10-CM

## 2015-08-27 LAB — TSH: TSH: 0.512 u[IU]/mL (ref 0.350–4.500)

## 2015-08-27 NOTE — Telephone Encounter (Signed)
Pt would like for provider to contact her. She states that she has not had a menstrual cycle in 10 months and now she has it again. She reports that she was instructed to inform her provider if this were to happen. Please contact patient at earliest convenience. Gail Cruz, ASA

## 2015-08-27 NOTE — Progress Notes (Signed)
Tsh done today [facility] Gail Cruz

## 2015-08-30 NOTE — Telephone Encounter (Signed)
Called Ms Deppe to discuss irregular menses.  Reports no menstrual cycle for 10 months and then onset of one Thursday.  She understands that in perimenopausal  Women, especially with hypothyroidism and rapid weight loss like she experienced, we could expect irregular menses. Explained that she will be in menopause when she is gone 1 year without a menstrual period. If bleeding occurs after one year from last menses, will need to consider endometrial biopsy. Also relayed information that TSH was normal. We will recheck TSH in 3 months and make sure it is stable. Can likely go to every 6 months checking after that. Future lab order placed and patient to make lab appointment.  Virginia Crews, MD, MPH PGY-2,  [facility] 08/30/2015 10:09 AM

## 2015-11-01 ENCOUNTER — Other Ambulatory Visit: Payer: Self-pay

## 2015-11-01 DIAGNOSIS — Z1231 Encounter for screening mammogram for malignant neoplasm of breast: Secondary | ICD-10-CM

## 2015-11-12 ENCOUNTER — Other Ambulatory Visit: Payer: BLUE CROSS/BLUE SHIELD

## 2015-11-12 DIAGNOSIS — E039 Hypothyroidism, unspecified: Secondary | ICD-10-CM

## 2015-11-12 LAB — TSH: TSH: 0.55 mIU/L

## 2015-11-12 NOTE — Progress Notes (Signed)
Labs done today Gail Cruz 

## 2015-11-15 ENCOUNTER — Encounter: Payer: Self-pay | Admitting: Family Medicine

## 2015-11-17 ENCOUNTER — Encounter: Payer: Self-pay | Admitting: Dietician

## 2015-11-17 ENCOUNTER — Encounter: Payer: BLUE CROSS/BLUE SHIELD | Attending: General Surgery | Admitting: Dietician

## 2015-11-17 DIAGNOSIS — Z029 Encounter for administrative examinations, unspecified: Secondary | ICD-10-CM | POA: Diagnosis present

## 2015-11-17 NOTE — Progress Notes (Signed)
Follow-up visit: 16 Months Post-Operative RYGB Surgery  Medical Nutrition Therapy:  Appt start time: 155 end time:  245  Primary concerns today: Post-operative Bariatric Surgery Nutrition Management. Returns with a 2.5 lb weight gain since last visit (mostly water). Feels like she is doing well. Knows when she needs to need stop eating. Can eat a variety of foods. Will have some sweets/treats sometimes.    Still wants to lose more weight and plans to try more after the Easter holiday.   Surgery date: 07/13/2014 Surgery type: RYGB Start weight at [facility]: 292.5 lbs on 05/29/2014 Weight today: 179.5 lbs  Weight change: 2.5 lbs gain Total weight loss: 118 lbs Goal: 125-130 lbs, original goal was 150 lbs  TANITA  BODY COMP RESULTS  06/29/14 07/28/14 09/09/14 10/14/14 01/13/15 04/14/15 07/14/15 11/17/15   BMI (kg/m^2) 52 48 47.7 43.5 38.3 35.0 33.4 33.9   Fat Mass (lbs) 148.5 143.0 115.5 115.0 88.5 74.0 72.5 72.5   Fat Free Mass (lbs) 126.5 111.0 137.5 115.0 114.0 111.0 104.5 107.0   Total Body Water (lbs) 92.5 81.5 100.5 84.0 83.5 81.5 76.5 78.5    Preferred Learning Style:   No preference indicated   Learning Readiness:   Ready  24-hr recall: B (8AM): raisin bran crunch or Honey Nut Cheerios with milk or 1 waffle or sandwich thin with egg and 2% cheese or piece of sausage (5 g) Snk (AM):grapes or sargento cheese or ham or trail mix (0-7 g) L (12PM): 2-3 oz chicken/pork chop/beans with sauteed vegetables sometimes with yams and slaw or chicken/tuna salad with egg with fat free mayo with triscuits or protein bar (14-21 g) Snk (PM): grapes or sargento cheese or ham  (0-7 g) D (7 PM): 2-3 oz chicken/pork chop/beans with sauteed vegetables sometimes with yams and slaw or chicken/tuna salad with egg with fat free mayo with triscuits protein bar (14-21 g) Snk (PM): 8  oz milk sometimes and sometimes tortilla with ham (8-12 g)  Fluid intake: 60 oz water, 8 oz 1% milk, sweet tea sometimes, diet  green tea = 64+ oz Estimated total protein intake: ~60 g  Medications: see list Supplementation: taking and taking biotin  Using straws: Yes  Drinking while eating: No Hair loss: No Carbonated beverages: Pepsi every once in a while N/V/D/C: had diarrhea 2 x recently with sweating and not sure Dumping syndrome:  No  Recent physical activity:  Walking on days off (stores)  Progress Towards Goal(s):  In progress.  Handouts given out during appointment: High Protein Snacks   Nutritional Diagnosis:  [place]-3.3 Overweight/obesity related to past poor dietary habits and physical inactivity as evidenced by patient w/ recent RYGB surgery following dietary guidelines for continued weight loss.    Intervention:  Nutrition education/diet reinforcement Goals:  Follow Phase 3B: High Protein + Non-Starchy Vegetables  Eat 3-6 small meals/snacks, every 3-5 hrs  Increase lean protein foods to meet 60g goal  Increase fluid intake to 64oz   Avoid drinking 15 minutes before, during and 30 minutes after eating  Aim for >30 min of physical activity daily, get back to walking 30 minutes for 4-5 x week  Eat protein first and then non starchy vegetables for the most part   Cut back on carbs (bread, waffle, cereal, crackers, sweets, fruit, trail mix) to get weight loss moving again  Drink mostly sugar free drinks    Teaching Method Utilized:  Visual Auditory Hands on  Barriers to learning/adherence to lifestyle change: none  Demonstrated degree of understanding  via:  Teach Back   Monitoring/Evaluation:  Dietary intake, exercise, and body weight. Follow up in 6 months for 22 month post-op visit.

## 2015-11-17 NOTE — Patient Instructions (Addendum)
Goals:  Follow Phase 3B: High Protein + Non-Starchy Vegetables  Eat 3-6 small meals/snacks, every 3-5 hrs  Increase lean protein foods to meet 60g goal  Increase fluid intake to 64oz   Avoid drinking 15 minutes before, during and 30 minutes after eating  Aim for >30 min of physical activity daily, get back to walking 30 minutes for 4-5 x week  Eat protein first and then non starchy vegetables for the most part   Cut back on carbs (bread, waffle, cereal, crackers, sweets, fruit, trail mix) to get weight loss moving again  Drink mostly sugar free drinks   Surgery date: 07/13/2014 Surgery type: RYGB Start weight at [facility]: 292.5 lbs on 05/29/2014 Weight today: 179.5 lbs  Weight change: 2.5 lbs gain Total weight loss: 118 lbs Goal: 125-130 lbs, original goal was 150 lbs  TANITA  BODY COMP RESULTS  06/29/14 07/28/14 09/09/14 10/14/14 01/13/15 04/14/15 07/14/15 11/17/15   BMI (kg/m^2) 52 48 47.7 43.5 38.3 35.0 33.4 33.9   Fat Mass (lbs) 148.5 143.0 115.5 115.0 88.5 74.0 72.5 72.5   Fat Free Mass (lbs) 126.5 111.0 137.5 115.0 114.0 111.0 104.5 107.0   Total Body Water (lbs) 92.5 81.5 100.5 84.0 83.5 81.5 76.5 78.5

## 2015-11-23 IMAGING — CT CT CHEST W/O CM
3 of 4 series · 17 of 30 positions shown, 19 images · non-contrast
Comparison: Images through the lung bases on CT abdomen pelvis of
12/26/2013

CLINICAL DATA: Followup right lung nodule, former smoking history

EXAM:
CT CHEST WITHOUT CONTRAST
TECHNIQUE: Multidetector CT imaging of the chest was performed following the
standard protocol without IV contrast.

[Series 3: chest w/o · axial · non-contrast · 0.64mm/px · z∈[-255,-70]mm · 5 of 57 slices shown, 7 images]
[im 10/57  mediastinal]
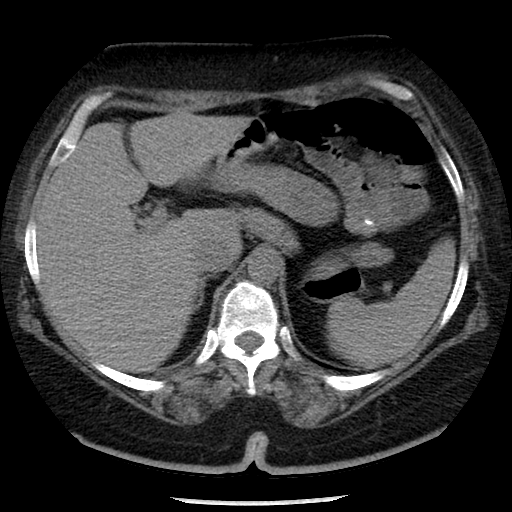
[im 10/57  lung]
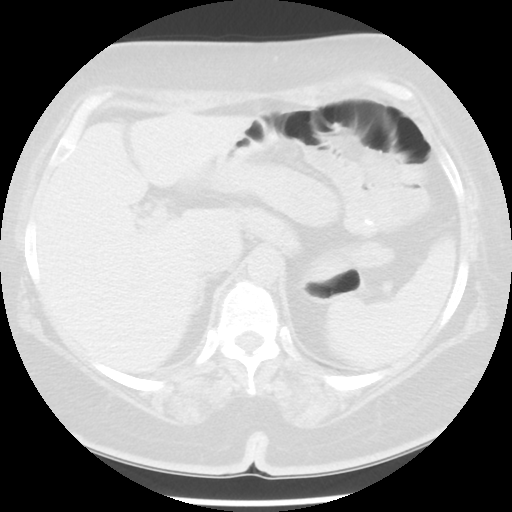
[im 19/57  lung]
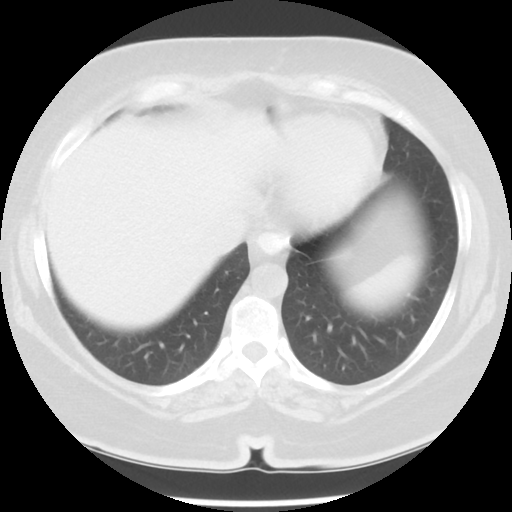
[im 29/57  lung]
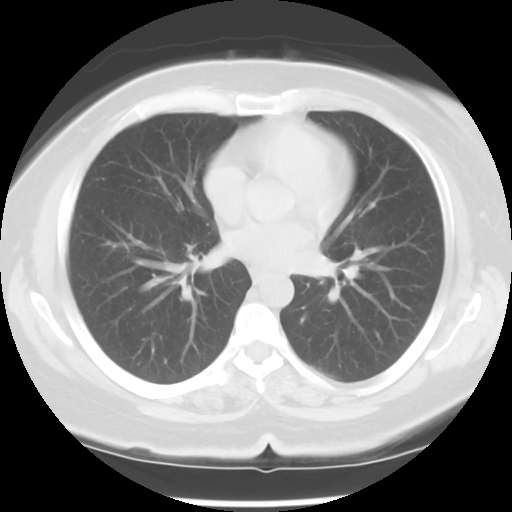
[im 38/57  lung]
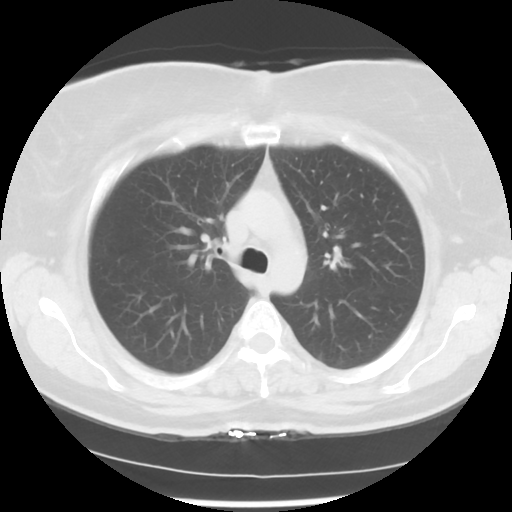
[im 47/57  mediastinal]
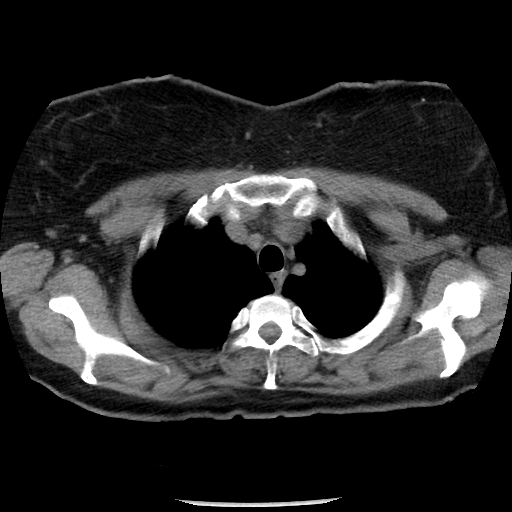
[im 47/57  lung]
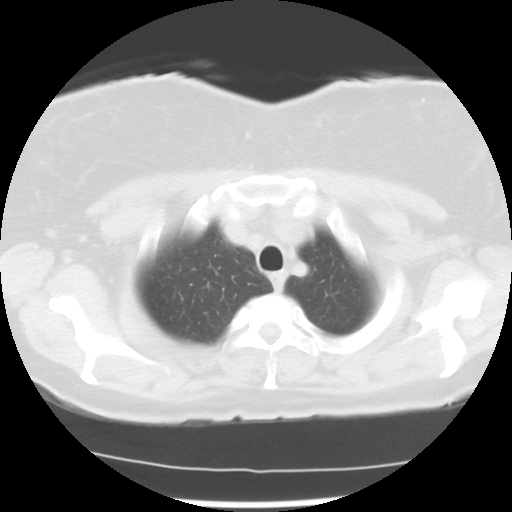

[Series 4: lung windows · axial · 0.64mm/px · z∈[-245,-80]mm · 4 of 57 slices shown]
[im 12/57  lung]
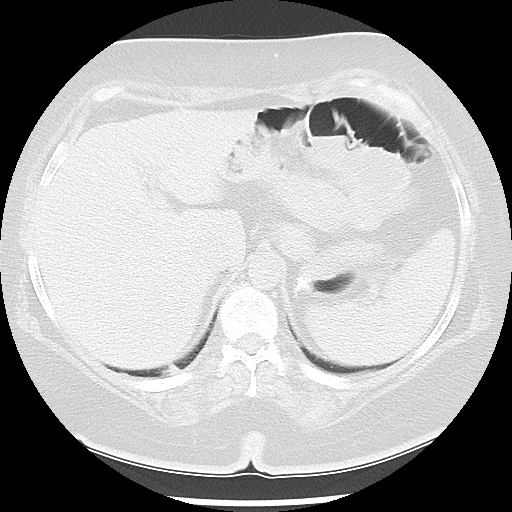
[im 23/57  lung]
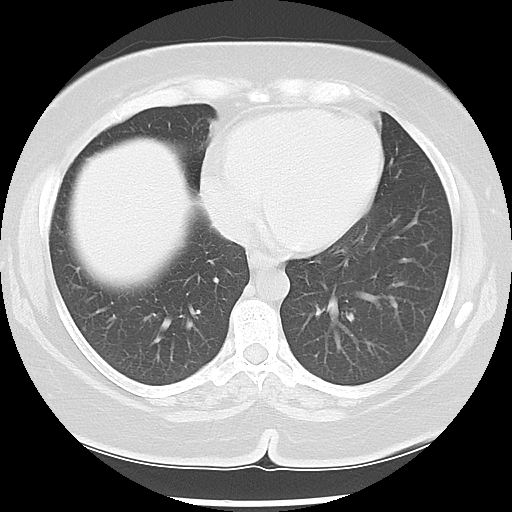
[im 34/57  lung]
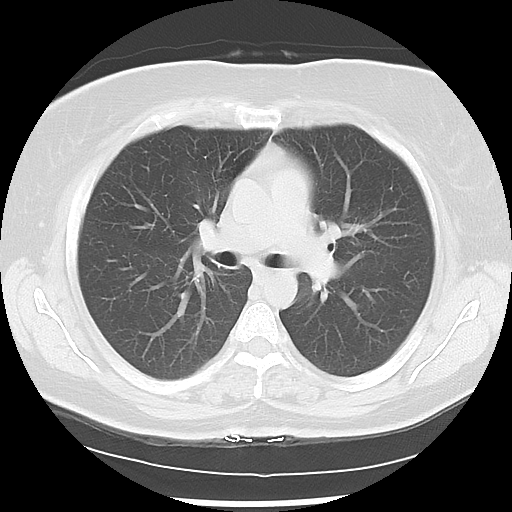
[im 45/57  lung]
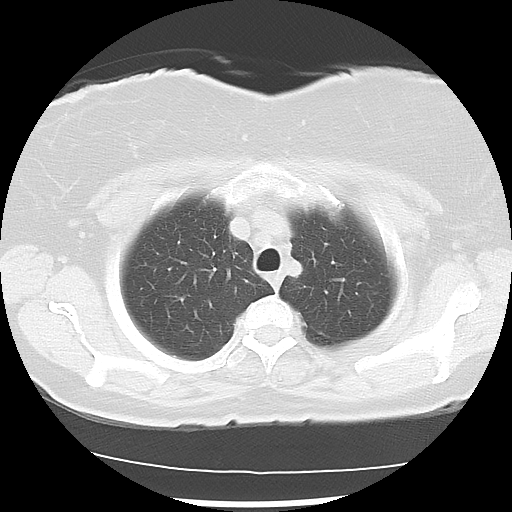

[Series 602: sagittal body · sagittal · 0.64mm/px · 8 of 133 slices shown]
[im 10/133  mediastinal]
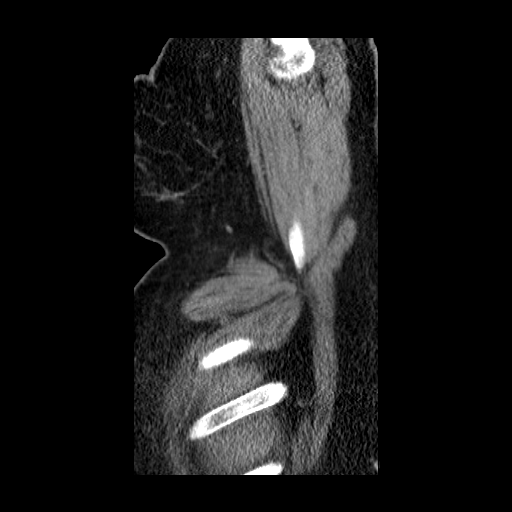
[im 29/133  mediastinal]
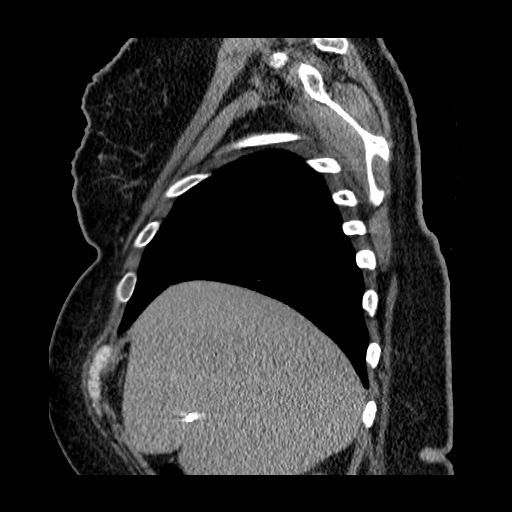
[im 48/133  mediastinal]
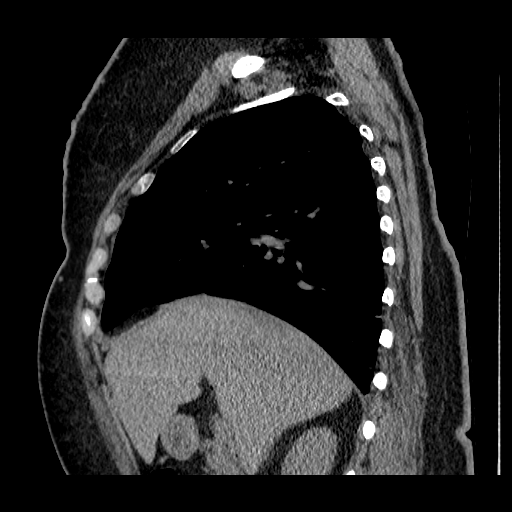
[im 57/133  mediastinal]
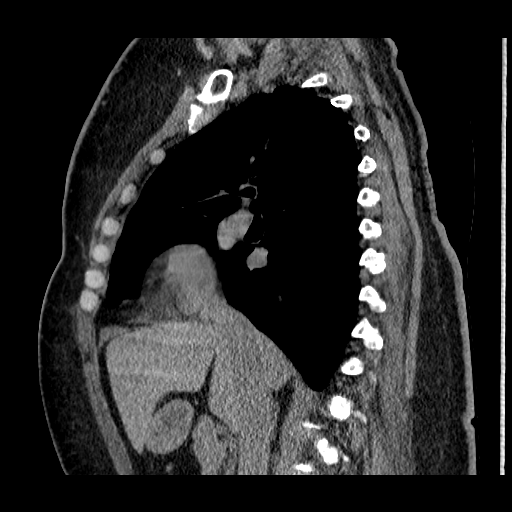
[im 76/133  mediastinal]
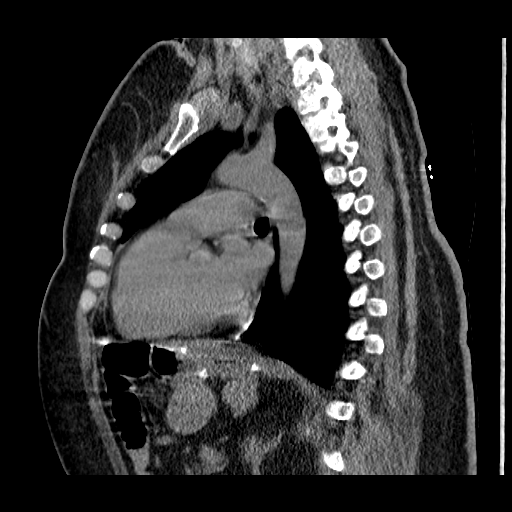
[im 85/133  mediastinal]
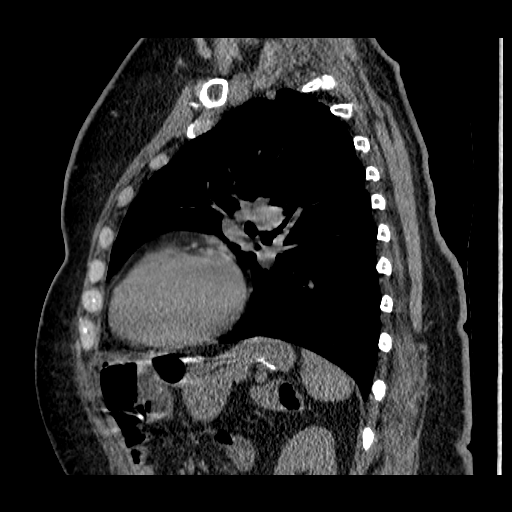
[im 104/133  mediastinal]
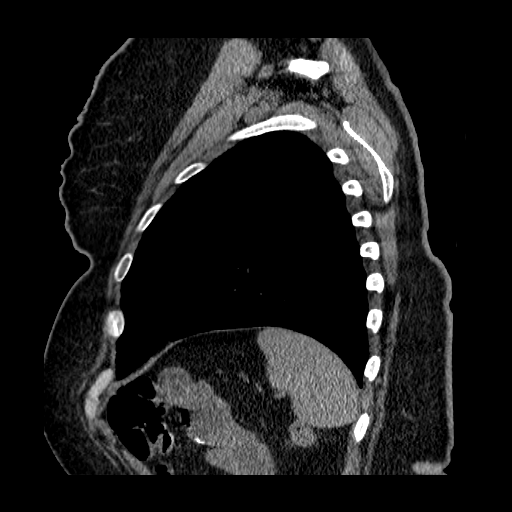
[im 123/133  mediastinal]
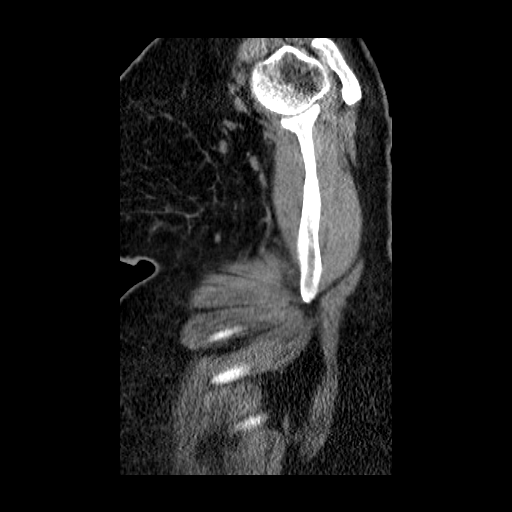

[17 of 30 positions shown; findings below may reference images not displayed]

FINDINGS: On lung window images, the previously noted subpleural 6 mm
noncalcified nodule in the posterior medial right lower lobe is
completely stable common seen currently on image 37. If the patient
is high risk 1 additional CT the chest in 1 year is recommended.
Otherwise no could further followup is necessary. No additional lung
lesion is seen. There is a triangular nodular opacity in the right
middle lobe abutting the major fissure with a flattened margin
consistent with a perifissural lymph node. No infiltrate or pleural
effusion is seen. The central airway is patent.

On soft tissue window images, the thyroid gland is unremarkable. On
this unenhanced study, only small mediastinal lymph nodes are
present, none of which are pathologically enlarged. The heart is
within upper limits of normal. What is seen of the upper abdomen is
unremarkable with multiple sutures presumably due to prior gastric
bypass surgery.
IMPRESSION: The 6 mm noncalcified nodule in the right lower lobe is completely
stable. If the patient is high risk 1 additional followup CT chest
in 1 year is recommended.

## 2015-12-03 ENCOUNTER — Ambulatory Visit
Admission: RE | Admit: 2015-12-03 | Discharge: 2015-12-03 | Disposition: A | Discharge status: Home / Self Care | Payer: BLUE CROSS/BLUE SHIELD | Source: Ambulatory Visit

## 2015-12-03 ENCOUNTER — Encounter: Payer: Self-pay | Admitting: Family Medicine

## 2015-12-03 DIAGNOSIS — Z1231 Encounter for screening mammogram for malignant neoplasm of breast: Secondary | ICD-10-CM

## 2015-12-10 ENCOUNTER — Other Ambulatory Visit: Payer: Self-pay | Admitting: Family Medicine

## 2016-01-07 ENCOUNTER — Ambulatory Visit: Payer: BLUE CROSS/BLUE SHIELD | Admitting: Family Medicine

## 2016-01-14 ENCOUNTER — Encounter: Payer: Self-pay | Admitting: Family Medicine

## 2016-01-14 ENCOUNTER — Ambulatory Visit (INDEPENDENT_AMBULATORY_CARE_PROVIDER_SITE_OTHER): Payer: BLUE CROSS/BLUE SHIELD | Admitting: Family Medicine

## 2016-01-14 VITALS — BP 127/74 | HR 79 | Temp 98.3°F | Ht 61.0 in | Wt 179.0 lb

## 2016-01-14 DIAGNOSIS — L720 Epidermal cyst: Secondary | ICD-10-CM | POA: Insufficient documentation

## 2016-01-14 DIAGNOSIS — L732 Hidradenitis suppurativa: Secondary | ICD-10-CM

## 2016-01-14 MED ORDER — CEPHALEXIN 500 MG PO CAPS: 500.0000 mg | ORAL_CAPSULE | Freq: Two times a day (BID) | ORAL | Status: DC

## 2016-01-14 NOTE — Progress Notes (Signed)
   Subjective:   Gail Cruz is a 55 y.o. female with a history of Hidradenitis, hypothyroidism, obesity status post gastric bypass here for knot between breasts  Knot between breasts - Patient was previously seen in 06/2015 for a lump between her breast - this was thought to be infected hidradenitis at that time and treated with Keflex for 7 days - resolved - tender, red, seems to be growing - able to drain it on her own but it always fills back up - Material it comes out is white and cheesy - denies fevers, chills, spreading erythema, N/V  Review of Systems:  Per HPI.   Social History: former smoker  Objective:  BP 127/74 mmHg  Pulse 79  Temp(Src) 98.3 F (36.8 C) (Oral)  Ht 5\' 1"  (1.549 m)  Wt 179 lb (81.194 kg)  BMI 33.84 kg/m2  LMP 08/07/2014  Gen:  55 y.o. female in NAD, pleasant, well appearing HEENT: NCAT, MMM, EOMI, PERRL, anicteric sclerae CV: RRR, no MRG Resp: Non-labored, CTAB, no wheezes noted Ext: WWP, no edema Skin: 2cm raised, erythematous nodule between breasts with surrounding 5cm of induration. No drainage visible currently, TTP MSK: No obvious deformities Neuro: Alert and oriented, speech normal     Assessment & Plan:     Gail Cruz is a 55 y.o. female here for  Epidermal inclusion cyst History and physical exam consistent with epidermal inclusion cyst Appears infected currently Treat with 7 day course of Keflex Referral to general surgery for removal  return precautions discussed - including signs of worsening infection Follow-up as needed     Virginia Crews, MD MPH PGY-2,  [facility] 01/14/2016  9:03 AM

## 2016-01-14 NOTE — Patient Instructions (Signed)
Nice to see you again today. I think this is an epidermal inclusion cyst. It is infected currently so we will treat with an antibiotic for the next 7 days. After that, it should be removed by the general surgeons so that it won't come back. I'm putting in a referral, but you should call them as you are established patient to see if they can remove it.  Take care, Dr. Jacinto Reap  Epidermal Cyst An epidermal cyst is sometimes called a sebaceous cyst, epidermal inclusion cyst, or infundibular cyst. These cysts usually contain a substance that looks "pasty" or "cheesy" and may have a bad smell. This substance is a protein called keratin. Epidermal cysts are usually found on the face, neck, or trunk. They may also occur in the vaginal area or other parts of the genitalia of both men and women. Epidermal cysts are usually small, painless, slow-growing bumps or lumps that move freely under the skin. It is important not to try to pop them. This may cause an infection and lead to tenderness and swelling. CAUSES  Epidermal cysts may be caused by a deep penetrating injury to the skin or a plugged hair follicle, often associated with acne. SYMPTOMS  Epidermal cysts can become inflamed and cause:  Redness.  Tenderness.  Increased temperature of the skin over the bumps or lumps.  Grayish-white, bad smelling material that drains from the bump or lump. DIAGNOSIS  Epidermal cysts are easily diagnosed by your caregiver during an exam. Rarely, a tissue sample (biopsy) may be taken to rule out other conditions that may resemble epidermal cysts. TREATMENT   Epidermal cysts often get better and disappear on their own. They are rarely ever cancerous.  If a cyst becomes infected, it may become inflamed and tender. This may require opening and draining the cyst. Treatment with antibiotics may be necessary. When the infection is gone, the cyst may be removed with minor surgery.  Small, inflamed cysts can often be treated  with antibiotics or by injecting steroid medicines.  Sometimes, epidermal cysts become large and bothersome. If this happens, surgical removal in your caregiver's office may be necessary. HOME CARE INSTRUCTIONS  Only take over-the-counter or prescription medicines as directed by your caregiver.  Take your antibiotics as directed. Finish them even if you start to feel better. SEEK MEDICAL CARE IF:   Your cyst becomes tender, red, or swollen.  Your condition is not improving or is getting worse.  You have any other questions or concerns. MAKE SURE YOU:  Understand these instructions.  Will watch your condition.  Will get help right away if you are not doing well or get worse.   This information is not intended to replace advice given to you by your health care provider. Make sure you discuss any questions you have with your health care provider.   Document Released: 06/24/2004 Document Revised: 10/16/2011 Document Reviewed: 01/30/2011 Elsevier Interactive Patient Education [employer].

## 2016-01-14 NOTE — Assessment & Plan Note (Signed)
History and physical exam consistent with epidermal inclusion cyst Appears infected currently Treat with 7 day course of Keflex Referral to general surgery for removal  return precautions discussed - including signs of worsening infection Follow-up as needed

## 2016-03-09 ENCOUNTER — Other Ambulatory Visit: Payer: Self-pay | Admitting: Family Medicine

## 2016-04-06 ENCOUNTER — Other Ambulatory Visit: Payer: Self-pay | Admitting: Family Medicine

## 2016-05-05 ENCOUNTER — Other Ambulatory Visit: Payer: Self-pay | Admitting: Internal Medicine

## 2016-05-17 ENCOUNTER — Encounter: Payer: BLUE CROSS/BLUE SHIELD | Attending: General Surgery | Admitting: Dietician

## 2016-05-17 DIAGNOSIS — Z9884 Bariatric surgery status: Secondary | ICD-10-CM | POA: Insufficient documentation

## 2016-05-17 DIAGNOSIS — E669 Obesity, unspecified: Secondary | ICD-10-CM

## 2016-05-17 NOTE — Progress Notes (Signed)
Follow-up visit: 2.5+ Years Months Post-Operative RYGB Surgery  Medical Nutrition Therapy:  Appt start time: 210 end time:  300  Primary concerns today: Post-operative Bariatric Surgery Nutrition Management. Returns with a 1 lb weight gain. Eating a lot of "pumpkin" stuff (sweets) lately.   Overall eating less food but eating foods she used to like to eat.   Has a cyst on her breast but is not going to have it removed at this time. Interested in having skin removal surgery.   Surgery date: 07/13/2014 Surgery type: RYGB Start weight at [facility]: 292.5 lbs on 05/29/2014 Weight today: 180.8 lbs  Weight change: 1 lbs gain Total weight loss: 118 lbs Goal:  150 lbs  TANITA  BODY COMP RESULTS  06/29/14 07/28/14 09/09/14 10/14/14 01/13/15 04/14/15 07/14/15 11/17/15 05/17/16   BMI (kg/m^2) 52 48 47.7 43.5 38.3 35.0 33.4 33.9 34.2   Fat Mass (lbs) 148.5 143.0 115.5 115.0 88.5 74.0 72.5 72.5 71.6   Fat Free Mass (lbs) 126.5 111.0 137.5 115.0 114.0 111.0 104.5 107.0 109.2   Total Body Water (lbs) 92.5 81.5 100.5 84.0 83.5 81.5 76.5 78.5 77.4    Preferred Learning Style:   No preference indicated   Learning Readiness:   Ready  24-hr recall: B (8AM): [facility] Protein bar or eggs, 2 pieces [place] bacon with rye toast toast (10 g)  Snk (AM):grapes or sargento cheese or ham or trail mix (0-7 g) L (12PM): sandwich thins with ham and cheese and pickles and 1/2 cup chips or or chicken/tuna salad with egg with fat free mayo with triscuits or 3 oz pork chops (14-21 g) Snk (PM): protein bar/fruit/trail mix or lately pumpkin bread, raisins, sometimes ice cream (0-10 g) D (7 PM): 2-3 oz chicken/pork chop/beans with sauteed vegetables sometimes with yams and slaw or chicken/tuna salad with egg with fat free mayo with triscuits protein bar (14-21 g) Snk (PM): none   Fluid intake: 60 oz water, 8 oz 1% milk, diet green tea, sweet tea 1 x week = 64+ oz Estimated total protein intake: ~60 g  Medications: see  list Supplementation: taking and taking biotin  Using straws: Yes  Drinking while eating: No Hair loss: No Carbonated beverages: Pepsi every once in a while N/V/D/C: No Dumping syndrome:  No  Recent physical activity:  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  Handouts given out during appointment: High Protein Snacks   Nutritional Diagnosis:  [place]-3.3 Overweight/obesity related to past poor dietary habits and physical inactivity as evidenced by patient w/ recent RYGB surgery following dietary guidelines for continued weight loss.    Intervention:  Nutrition education/diet reinforcement Goals:  Follow Phase 3B: High Protein + Non-Starchy Vegetables  Eat 3-6 small meals/snacks, every 3-5 hrs  Increase lean protein foods to meet 60g goal  Increase fluid intake to 64oz   Avoid drinking 15 minutes before, during and 30 minutes after eating  Aim for >30 min of physical activity daily, Continue walking 30 minutes for 4-5 x week  Eat protein first and then non starchy vegetables for the most part   Eat regular fruit with cheese, peanut butter, palm of your hand amount of nuts, boiled egg   Drink mostly sugar free drinks   Teaching Method Utilized:  Visual Auditory Hands on  Barriers to learning/adherence to lifestyle change: none  Demonstrated degree of understanding via:  Teach Back   Monitoring/Evaluation:  Dietary intake, exercise, and body weight. Follow up in  3 months for 3+ year post-op visit.

## 2016-05-17 NOTE — Patient Instructions (Addendum)
Goals:  Follow Phase 3B: High Protein + Non-Starchy Vegetables  Eat 3-6 small meals/snacks, every 3-5 hrs  Increase lean protein foods to meet 60g goal  Increase fluid intake to 64oz   Avoid drinking 15 minutes before, during and 30 minutes after eating  Aim for >30 min of physical activity daily, Continue walking 30 minutes for 4-5 x week  Eat protein first and then non starchy vegetables for the most part   Eat regular fruit with cheese, peanut butter, palm of your hand amount of nuts, boiled egg   Drink mostly sugar free drinks   Surgery date: 07/13/2014 Surgery type: RYGB Start weight at [facility]: 292.5 lbs on 05/29/2014 Weight today: 180.8 lbs  Weight change: 1 lbs gain Total weight loss: 118 lbs   TANITA  BODY COMP RESULTS  06/29/14 07/28/14 09/09/14 10/14/14 01/13/15 04/14/15 07/14/15 11/17/15 05/17/16   BMI (kg/m^2) 52 48 47.7 43.5 38.3 35.0 33.4 33.9 34.2   Fat Mass (lbs) 148.5 143.0 115.5 115.0 88.5 74.0 72.5 72.5 71.6   Fat Free Mass (lbs) 126.5 111.0 137.5 115.0 114.0 111.0 104.5 107.0 109.2   Total Body Water (lbs) 92.5 81.5 100.5 84.0 83.5 81.5 76.5 78.5 77.4

## 2016-06-21 ENCOUNTER — Ambulatory Visit (INDEPENDENT_AMBULATORY_CARE_PROVIDER_SITE_OTHER): Payer: BLUE CROSS/BLUE SHIELD | Admitting: Family Medicine

## 2016-06-21 ENCOUNTER — Encounter: Payer: Self-pay | Admitting: Family Medicine

## 2016-06-21 VITALS — BP 132/70 | HR 80 | Temp 98.3°F | Ht 61.0 in | Wt 183.8 lb

## 2016-06-21 DIAGNOSIS — E669 Obesity, unspecified: Secondary | ICD-10-CM | POA: Diagnosis not present

## 2016-06-21 DIAGNOSIS — E785 Hyperlipidemia, unspecified: Secondary | ICD-10-CM

## 2016-06-21 DIAGNOSIS — E039 Hypothyroidism, unspecified: Secondary | ICD-10-CM | POA: Diagnosis not present

## 2016-06-21 DIAGNOSIS — Z0000 Encounter for general adult medical examination without abnormal findings: Secondary | ICD-10-CM

## 2016-06-21 LAB — COMPLETE METABOLIC PANEL WITH GFR
ALT: 19 U/L (ref 6–29)
AST: 19 U/L (ref 10–35)
Albumin: 3.9 g/dL (ref 3.6–5.1)
Alkaline Phosphatase: 80 U/L (ref 33–130)
BUN: 19 mg/dL (ref 7–25)
CALCIUM: 9.1 mg/dL (ref 8.6–10.4)
CO2: 24 mmol/L (ref 20–31)
Chloride: 105 mmol/L (ref 98–110)
Creat: 0.72 mg/dL (ref 0.50–1.05)
GFR, Est African American: >89 mL/min (ref >=60)
Glucose, Bld: 88 mg/dL (ref 65–99)
POTASSIUM: 3.8 mmol/L (ref 3.5–5.3)
SODIUM: 140 mmol/L (ref 135–146)
Total Bilirubin: 0.6 mg/dL (ref 0.2–1.2)
Total Protein: 6.5 g/dL (ref 6.1–8.1)

## 2016-06-21 LAB — TSH: TSH: 0.33 m[IU]/L — AB

## 2016-06-21 LAB — LIPID PANEL
CHOL/HDL RATIO: 3.2 ratio (ref <5.0)
CHOLESTEROL: 186 mg/dL (ref <200)
HDL: 58 mg/dL (ref >50)
LDL Cholesterol: 111 mg/dL — ABNORMAL HIGH (ref <100)
Triglycerides: 84 mg/dL (ref <150)
VLDL: 17 mg/dL (ref <30)

## 2016-06-21 LAB — POCT GLYCOSYLATED HEMOGLOBIN (HGB A1C): Hemoglobin A1C: 5.5

## 2016-06-21 NOTE — Assessment & Plan Note (Signed)
Continue Synthroid 100 g daily Check TSH today Follow-up in one year as has been stable

## 2016-06-21 NOTE — Assessment & Plan Note (Signed)
Not taking any medications since 2016 status post gastric bypass Repeat lipid panel today

## 2016-06-21 NOTE — Assessment & Plan Note (Signed)
Continues to follow with nutrition Advised on diet and exercise

## 2016-06-21 NOTE — Assessment & Plan Note (Signed)
Up-to-date on cancer screening Check lipids, CMP, A1c Advised on diet and exercise

## 2016-06-21 NOTE — Patient Instructions (Addendum)
Nice to see you again today. Continue taking your current medications. We are getting some labs today and someone will call you or send you a letter with the results when they're available.  Next wellness checkup in 1 year.  Take care, Dr. B   Things to do to keep yourself healthy  - Exercise at least 30-45 minutes a day, 3-4 days a week.  - Eat a low-fat diet with lots of fruits and vegetables, up to 7-9 servings per day.  - Seatbelts can save your life. Wear them always.  - Smoke detectors on every level of your home, check batteries every year.  - Eye Doctor - have an eye exam every 1-2 years  - Safe sex - if you may be exposed to STDs, use a condom.  - Alcohol -  If you drink, do it moderately, less than 2 drinks per day.  - [place]. Choose someone to speak for you if you are not able.  - Depression is common in our stressful world.If you're feeling down or losing interest in things you normally enjoy, please come in for a visit.  - Violence - If anyone is threatening or hurting you, please call immediately.

## 2016-06-21 NOTE — Progress Notes (Signed)
   Subjective:   Gail Cruz is a 55 y.o. female with a history of hypothyroidism, gastric bypass, HLD here for well woman/preventative visit  Acute Concerns:   T2DM, HTN, HLD - resolved s/p gastric bypass, no meds since 2015/16  Hypothyroidism: - taking Synthroid 177mcg daily - reports good compliance - no diarrhea/constipation, no hair/skin changes, no palpitations  Passed out at [facility] - 06/03/16 - hit head on concrete - everything seemed fine - incl head CT, CBG - better with IVF - think it was dehydration - felt hot beforehand - has felt well since - no dizziness, HAs, neck pain   Diet: small portions, but eats whatever foods she wants  Exercise: walking a lot at work  Sexual/Birth History: G2P1011, not currently sexually active  Birth Control: none, perimenopausal - 5 months since LMP  POA/Living Will: none   Review of Systems: Per HPI.    PMH, PSH, Medications, Allergies, and FmHx reviewed and updated in EMR.  Social History: fomrer smoker  Immunization:  Tdap/TD: 01/2011  Influenza: received at work 05/17/16  Pneumococcal: n/a  Herpes Zoster: n/a  Cancer Screening:  Pap Smear: 06/2015 - normal, HPV neg  Mammogram: 11/2015 - negative  Colonoscopy: 2014 - negative - q62yr given FamHx  Dexa: n/a   Objective:  BP 132/70   Pulse 80   Temp 98.3 F (36.8 C) (Oral)   Ht 5\' 1"  (1.549 m)   Wt 183 lb 12.8 oz (83.4 kg)   LMP 08/07/2014   BMI 34.73 kg/m   Gen:  55 y.o. female in NAD  HEENT: NCAT, MMM, EOMI, PERRL, anicteric sclerae CV: RRR, no MRG Resp: Non-labored, CTAB, no wheezes noted Abd: Soft, NTND, BS present, no guarding or organomegaly Ext: WWP, no edema  MSK: No obvious deformities, gait intact Neuro: Alert and oriented, speech normal        Chemistry      Component Value Date/Time   NA 144 06/18/2015 0930   K 5.0 06/18/2015 0930   CL 110 06/18/2015 0930   CO2 26 06/18/2015 0930   BUN 16 06/18/2015 0930   CREATININE  0.71 06/18/2015 0930      Component Value Date/Time   CALCIUM 9.0 06/18/2015 0930   ALKPHOS 93 06/18/2015 0930   AST 17 06/18/2015 0930   ALT 19 06/18/2015 0930   BILITOT 0.8 06/18/2015 0930      Lab Results  Component Value Date   WBC 8.4 02/13/2015   HGB 15.0 02/13/2015   HCT 44.0 02/13/2015   MCV 84.0 02/13/2015   PLT 210 02/13/2015   Lab Results  Component Value Date   TSH 0.55 11/12/2015   Lab Results  Component Value Date   HGBA1C 5.5 06/18/2015    Assessment & Plan:     Gail Cruz is a 55 y.o. female here for annual well woman/preventative exam  Hypothyroidism Continue Synthroid 100 g daily Check TSH today Follow-up in one year as has been stable  Preventative health care Up-to-date on cancer screening Check lipids, CMP, A1c Advised on diet and exercise  Obesity (BMI 30.0-34.9) Continues to follow with nutrition Advised on diet and exercise  Hyperlipidemia Not taking any medications since 2016 status post gastric bypass Repeat lipid panel today   Virginia Crews, MD MPH PGY-3,  [facility] 06/21/2016  2:12 PM

## 2016-06-23 ENCOUNTER — Telehealth: Payer: Self-pay | Admitting: Family Medicine

## 2016-06-23 NOTE — Telephone Encounter (Signed)
Patient informed of message from MD, expressed understanding. Patient states that she is feeling well on current dose of synthroid and would like to continue current dose.

## 2016-06-23 NOTE — Telephone Encounter (Signed)
Called patient to discuss lab results. Normal A1c, electrolytes, kidney and liver function.  Lipids are slightly elevated, but ASCVD 1.1 % over 10 years.  TSH is slightly low at 0.33 (low end of normal 0.35).  Could go down to 71mcg synthroid dose daily or continue current dose if feeling well without anxiety, palpitations, unintentional weight loss.  Would recheck in 3 months either way.  No answer. Left VM asking to return our call.  Please relay above to patient if she returns call and let me know if she has questions.  Virginia Crews, MD, MPH PGY-3,  [facility] 06/23/2016 1:49 PM

## 2016-09-07 ENCOUNTER — Other Ambulatory Visit: Payer: Self-pay | Admitting: Family Medicine

## 2016-10-16 ENCOUNTER — Ambulatory Visit: Payer: BLUE CROSS/BLUE SHIELD | Admitting: Skilled Nursing Facility1

## 2016-10-16 ENCOUNTER — Ambulatory Visit: Payer: BLUE CROSS/BLUE SHIELD | Admitting: Dietician

## 2016-10-23 ENCOUNTER — Other Ambulatory Visit: Payer: Self-pay | Admitting: Family Medicine

## 2016-10-23 DIAGNOSIS — Z1231 Encounter for screening mammogram for malignant neoplasm of breast: Secondary | ICD-10-CM

## 2016-11-22 ENCOUNTER — Encounter: Payer: BLUE CROSS/BLUE SHIELD | Attending: General Surgery | Admitting: Skilled Nursing Facility1

## 2016-11-22 ENCOUNTER — Encounter: Payer: Self-pay | Admitting: Skilled Nursing Facility1

## 2016-11-22 DIAGNOSIS — E119 Type 2 diabetes mellitus without complications: Secondary | ICD-10-CM

## 2016-11-22 DIAGNOSIS — Z9884 Bariatric surgery status: Secondary | ICD-10-CM | POA: Diagnosis not present

## 2016-11-22 NOTE — Patient Instructions (Addendum)
-  Try to get back to walking when you can  -For bad weather days try youtube in the house  -Look into making an appointment with your surgeon for follow up  -Get a thiamin supplement that is 12mg  or more  -Take your thyroid 4 hours before your other medicines At once: 1 multi, b12, biotin, thiamin then 2 hours later calcium and so on

## 2016-11-22 NOTE — Progress Notes (Signed)
Medical Nutrition Therapy:  Appt start time: 210 end time:  300  Primary concerns today: Post-operative Bariatric Surgery Nutrition Management.   Pt arrives not wanting to weigh. Pt states she cheated. Pt states she is trying to get in more water and work out more. Pt state she is off 3 days a week. Pt states a couple weeks ago she was having bad pains in her stomach. Pt states she eats what she eats and that is it. Pt states she does fine with her food and she is okay with it. Pt states she thinks she is doing good.   Surgery date: 07/13/2014 Surgery type: RYGB Start weight at [facility]: 292.5 lbs on 05/29/2014 Weight today: 180.8 lbs  Weight change: 1 lbs gain Total weight loss: 118 lbs Goal:  150 lbs  TANITA  BODY COMP RESULTS  06/29/14 07/28/14 09/09/14 10/14/14 01/13/15 04/14/15 07/14/15 11/17/15 05/17/16   BMI (kg/m^2) 52 48 47.7 43.5 38.3 35.0 33.4 33.9 34.2   Fat Mass (lbs) 148.5 143.0 115.5 115.0 88.5 74.0 72.5 72.5 71.6   Fat Free Mass (lbs) 126.5 111.0 137.5 115.0 114.0 111.0 104.5 107.0 109.2   Total Body Water (lbs) 92.5 81.5 100.5 84.0 83.5 81.5 76.5 78.5 77.4    Preferred Learning Style:   No preference indicated   Learning Readiness:   Ready  24-hr recall: B (8AM): [facility] Protein bar or eggs, 2 pieces [place] bacon with rye toast toast (10 g)  Snk (AM):grapes or [place] cheese or ham or trail mix (0-7 g) L (12PM): sandwich thins with ham and cheese and pickles and 1/2 cup chips or or chicken/tuna salad with egg with fat free mayo with triscuits or 3 oz pork chops (14-21 g) Snk (PM): protein bar/fruit/trail mix or lately pumpkin bread, raisins, sometimes ice cream (0-10 g) D (7 PM): 2-3 oz chicken/pork chop/beans with sauteed vegetables sometimes with yams and slaw or chicken/tuna salad with egg with fat free mayo with triscuits protein bar (14-21 g) Snk (PM): none   Fluid intake: 60 oz water, 8 oz 1% milk, diet green tea, sweet tea/pepsi/orange juice  = 64+  oz Estimated total protein intake: ~60 g  Medications: see list Supplementation: taking a flinstone with iron and taking biotin and calcium and b12 once a day   Using straws: Yes  Drinking while eating: No Hair loss: No Carbonated beverages: Pepsi  N/V/D/C: No Dumping syndrome:  YES once This month: unable to get a linear plan of food from the pt to determine what could have caused it  Having you been chewing well: maybe Chewing/swallowing difficulties: no Changes in vision: no Changes to mood/headaches: no Hair loss/Changes to skin/Changes to nails: no Any difficulty focusing or concentrating: no Sweating: no Dizziness/Lightheaded: no Palpitations: no   Recent physical activity:  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  Handouts given out during appointment: High Protein Snacks   Nutritional Diagnosis:  [place]-3.3 Overweight/obesity related to past poor dietary habits and physical inactivity as evidenced by patient w/ recent RYGB surgery following dietary guidelines for continued weight loss.    Intervention:  Nutrition education/diet reinforcement Goals:  Follow Phase 3B: High Protein + Non-Starchy Vegetables  Eat 3-6 small meals/snacks, every 3-5 hrs  Increase lean protein foods to meet 60g goal  Increase fluid intake to 64oz   Avoid drinking 15 minutes before, during and 30 minutes after eating  Aim for >30 min of physical activity daily, Continue walking  30 minutes for 4-5 x week  Eat protein first and then non starchy vegetables for the most part   Eat regular fruit with cheese, peanut butter, palm of your hand amount of nuts, boiled egg   Drink mostly sugar free drinks -Try to get back to walking when you can -For bad weather days try youtube in the house -Look into making an appointment with your surgeon for follow up -Get a thiamin supplement that is 12mg  or more -Take your thyroid 4 hours before  your other medicines At once: 1 multi, b12, biotin, thiamin then 2 hours later calcium and so on  Teaching Method Utilized:  Visual Auditory Hands on  Barriers to learning/adherence to lifestyle change: none  Demonstrated degree of understanding via:  Teach Back   Monitoring/Evaluation:  Dietary intake, exercise, and body weight.

## 2016-12-07 ENCOUNTER — Other Ambulatory Visit: Payer: Self-pay | Admitting: Family Medicine

## 2016-12-08 ENCOUNTER — Ambulatory Visit
Admission: RE | Admit: 2016-12-08 | Discharge: 2016-12-08 | Disposition: A | Discharge status: Home / Self Care | Payer: BLUE CROSS/BLUE SHIELD | Source: Ambulatory Visit | Attending: Family Medicine | Admitting: Family Medicine

## 2016-12-08 ENCOUNTER — Encounter: Payer: Self-pay | Admitting: Family Medicine

## 2016-12-08 DIAGNOSIS — Z1231 Encounter for screening mammogram for malignant neoplasm of breast: Secondary | ICD-10-CM

## 2017-02-08 ENCOUNTER — Other Ambulatory Visit: Payer: Self-pay | Admitting: Family Medicine

## 2017-03-07 ENCOUNTER — Other Ambulatory Visit: Payer: Self-pay | Admitting: Internal Medicine

## 2017-04-12 ENCOUNTER — Other Ambulatory Visit: Payer: Self-pay | Admitting: Internal Medicine

## 2017-05-16 ENCOUNTER — Encounter: Payer: Self-pay | Admitting: Skilled Nursing Facility1

## 2017-05-16 ENCOUNTER — Encounter: Payer: BLUE CROSS/BLUE SHIELD | Attending: General Surgery | Admitting: Skilled Nursing Facility1

## 2017-05-16 DIAGNOSIS — Z713 Dietary counseling and surveillance: Secondary | ICD-10-CM | POA: Diagnosis not present

## 2017-05-16 DIAGNOSIS — E669 Obesity, unspecified: Secondary | ICD-10-CM | POA: Insufficient documentation

## 2017-05-16 DIAGNOSIS — E119 Type 2 diabetes mellitus without complications: Secondary | ICD-10-CM

## 2017-05-16 NOTE — Progress Notes (Signed)
  Medical Nutrition Therapy:  Appt start time: 210 end time:  300  Primary concerns today: Post-operative Bariatric Surgery Nutrition Management.   Pt states she is excited for the [employer]. Pt states her son is now sober. Pt states she works 12 hour shifts. Pt states she can't drink juice because it feels different. Pt Asked about skin removal. pt does smoke.  Surgery date: 07/13/2014 Surgery type: RYGB Start weight at [facility]: 292.5 lbs on 05/29/2014 Weight today: 176 lbs  TANITA  BODY COMP RESULTS  06/29/14 07/28/14 09/09/14 10/14/14 01/13/15 04/14/15 07/14/15 11/17/15 05/16/16   BMI (kg/m^2) 52 48 47.7 43.5 38.3 35.0 33.4 33.9 34.2   Fat Mass (lbs) 148.5 143.0 115.5 115.0 88.5 74.0 72.5 72.5 71.6   Fat Free Mass (lbs) 126.5 111.0 137.5 115.0 114.0 111.0 104.5 107.0 109.2   Total Body Water (lbs) 92.5 81.5 100.5 84.0 83.5 81.5 76.5 78.5 77.4    Preferred Learning Style:   No preference indicated   Learning Readiness:   Ready  24-hr recall: B (8AM): [facility] Protein bar or eggs, 2 pieces [place] bacon with rye toast toast (10 g) ----none most of the time Snk (9-9:30AM):trail mix or protein bar L (12PM): sandwich thins with ham and cheese and pickles and 1/2 cup chips or or chicken/tuna salad with egg with fat free mayo with triscuits or 3 oz pork chops (14-21 g) Snk (PM): protein bar/fruit/trail mix or lately pumpkin bread, raisins, sometimes ice cream (0-10 g) D (7 PM): 2-3 oz chicken/pork chop/beans with sauteed vegetables sometimes with yams and slaw or chicken/tuna salad with egg with fat free mayo with triscuits protein bar (14-21 g) Snk (PM): none   Fluid intake: 60 oz water, 8 oz 1% milk, diet green tea, sweet tea/pepsi/orange juice  = 50-60+ oz Estimated total protein intake: ~60 g  Medications: see list Supplementation: taking a flinstone with iron and calcium and b12 once a day and thiamin  Using straws: Yes  Drinking while eating: No Hair loss:  No Carbonated beverages: Pepsi  N/V/D/C: No Dumping syndrome:  YES once This month: unable to get a linear plan of food from the pt to determine what could have caused it  Having you been chewing well: maybe Chewing/swallowing difficulties: no Changes in vision: no Changes to mood/headaches: no Hair loss/Changes to skin/Changes to nails: no Any difficulty focusing or concentrating: no Sweating: no Dizziness/Lightheaded: no Palpitations: no   Recent physical activity:  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  Handouts given out during appointment: High Protein Snacks   Nutritional Diagnosis:  [place]-3.3 Overweight/obesity related to past poor dietary habits and physical inactivity as evidenced by patient w/ recent RYGB surgery following dietary guidelines for continued weight loss.    Intervention:  Nutrition education/diet reinforcement Goals: Take the specific multivitamin Make an appointment with your surgeon Teaching Method Utilized:  Visual Auditory Hands on  Barriers to learning/adherence to lifestyle change: none  Demonstrated degree of understanding via:  Teach Back   Monitoring/Evaluation:  Dietary intake, exercise, and body weight.

## 2017-07-13 ENCOUNTER — Encounter: Payer: BLUE CROSS/BLUE SHIELD | Admitting: Internal Medicine

## 2017-07-27 ENCOUNTER — Ambulatory Visit (INDEPENDENT_AMBULATORY_CARE_PROVIDER_SITE_OTHER): Payer: BLUE CROSS/BLUE SHIELD | Admitting: Internal Medicine

## 2017-07-27 ENCOUNTER — Encounter: Payer: Self-pay | Admitting: Internal Medicine

## 2017-07-27 VITALS — BP 130/70 | HR 72 | Temp 98.0°F | Ht 61.0 in | Wt 177.0 lb

## 2017-07-27 DIAGNOSIS — Z8639 Personal history of other endocrine, nutritional and metabolic disease: Secondary | ICD-10-CM

## 2017-07-27 DIAGNOSIS — Z8679 Personal history of other diseases of the circulatory system: Secondary | ICD-10-CM | POA: Diagnosis not present

## 2017-07-27 DIAGNOSIS — E039 Hypothyroidism, unspecified: Secondary | ICD-10-CM | POA: Diagnosis not present

## 2017-07-27 DIAGNOSIS — Z9884 Bariatric surgery status: Secondary | ICD-10-CM | POA: Diagnosis not present

## 2017-07-27 DIAGNOSIS — E785 Hyperlipidemia, unspecified: Secondary | ICD-10-CM

## 2017-07-27 LAB — POCT GLYCOSYLATED HEMOGLOBIN (HGB A1C): Hemoglobin A1C: 5.6

## 2017-07-27 NOTE — Assessment & Plan Note (Signed)
Eating small portions. Taking vitamins as recommended by her nutritionist.  - B12 level today - Continue following with nutrition

## 2017-07-27 NOTE — Assessment & Plan Note (Signed)
Last lipid panel WNL after gastric bypass. No longer requiring statin.  - Lipid panel today

## 2017-07-27 NOTE — Assessment & Plan Note (Signed)
Taking Synthroid as prescribed.  - Measure TSH today - Continue Synthroid at current dose. Will adjust as necessary if TSH not WNL.

## 2017-07-27 NOTE — Progress Notes (Signed)
56 y.o. year old female presents for well woman/preventative visit and annual GYN examination.  Acute Concerns: Hypothyroidism Taking Synthroid as prescribed. Last TSH last year, slightly below normal. Denies any symptoms, including fatigue, dry skin, weight gain.    Diet: History of gastric bypass with significant weight loss. Small portions but eats whatever she likes. Has difficulty with greasy foods. Drinks mainly water, diet green tea, milk once daily, occasionally soda or sweet tea but infrequently.   Exercise: Walks 30 min/day  Sexual/Birth History: G2P1011. Not sexually active.   Birth Control: Post-menopausal  Surgical History: Past Surgical History:  Procedure Laterality Date  . BREATH TEK H PYLORI N/A 01/14/2014   Procedure: BREATH TEK H PYLORI;  Surgeon: Edward Jolly, MD;  Location: [facility];  Service: General;  Laterality: N/A;  . [place]   at [place]  . CHOLECYSTECTOMY    . COLONOSCOPY N/A 02/21/2013   Procedure: COLONOSCOPY;  Surgeon: Inda Castle, MD;  Location: [facility];  Service: Endoscopy;  Laterality: N/A;  . GASTRIC ROUX-EN-Y N/A 07/13/2014   Procedure: LAPAROSCOPIC ROUX-EN-Y GASTRIC BYPASS WITH UPPER ENDOSCOPY,umbilical hernia repair;  Surgeon: Excell Seltzer, MD;  Location: [facility];  Service: General;  Laterality: N/A;    Allergies: Allergies  Allergen Reactions  . Ciprofloxacin     REACTION: Rash    Social:  Social History   Socioeconomic History  . Marital status: Single    Spouse name: None  . Number of children: None  . Years of education: None  . Highest education level: None  Social Needs  . Financial resource strain: None  . Food insecurity - worry: None  . Food insecurity - inability: None  . Transportation needs - medical: None  . Transportation needs - non-medical: None  Occupational History  . None  Tobacco Use  . Smoking status: Former Smoker    Types: Cigarettes  . Smokeless tobacco: Former  Systems developer    Quit date: 08/07/2012  . Tobacco comment: none in 2 weeks  Substance and Sexual Activity  . Alcohol use: No  . Drug use: No  . Sexual activity: None  Other Topics Concern  . None  Social History Narrative  . None    Immunization: Immunization History  Administered Date(s) Administered  . Influenza,inj,Quad PF,6+ Mos 06/26/2014, 05/21/2015, 05/17/2016  . Influenza-Unspecified 06/07/2017  . Tdap 01/18/2011    Cancer Screening:  Pap Smear: Next 2021. Last 06/2015 with no abnormalities.   Mammogram: Last 6 months ago with no abnormalities.   Colonoscopy: Next July 2019. (Gets every 5 yrs due to family history of colon cancer    (father)).   Physical Exam: VITALS: Reviewed GEN: Pleasant female, NAD HEENT: Normocephalic, PERRL, EOMI, no scleral icterus, bilateral TM pearly grey, nasal septum midline, MMM, uvula midline, no anterior or posterior lymphadenopathy, no thyromegaly CARDIAC:RRR, S1 and S2 present, no murmur, no heaves/thrills RESP: CTAB, normal effort ABD: Soft, no tenderness, normal bowel sounds EXT: No edema, 2+ radial and DP pulses SKIN: Warm and dry, no rash  ASSESSMENT & PLAN: 56 y.o. female presents for annual well woman/preventative exam and GYN exam. Please see problem specific assessment and plan.   Hypothyroidism Taking Synthroid as prescribed.  - Measure TSH today - Continue Synthroid at current dose. Will adjust as necessary if TSH not WNL.   Hyperlipidemia Last lipid panel WNL after gastric bypass. No longer requiring statin.  - Lipid panel today  S/P gastric bypass Eating small portions. Taking vitamins as recommended by her  nutritionist.  - B12 level today - Continue following with nutrition    Adin Hector, MD, MPH PGY-3 [facility] Pager [phone]

## 2017-07-27 NOTE — Patient Instructions (Signed)
It was nice meeting you today [name]!  I will call you if there are any abnormalities with your bloodwork. Please continue taking your Synthroid and vitamins as you have been.   If you have any questions or concerns, please feel free to call the clinic.   Be well,  Dr. Avon Gully

## 2017-07-28 LAB — LIPID PANEL
CHOL/HDL RATIO: 3.1 ratio (ref 0.0–4.4)
CHOLESTEROL TOTAL: 174 mg/dL (ref 100–199)
HDL: 57 mg/dL (ref >39)
LDL CALC: 100 mg/dL — AB (ref 0–99)
Triglycerides: 87 mg/dL (ref 0–149)
VLDL CHOLESTEROL CAL: 17 mg/dL (ref 5–40)

## 2017-07-28 LAB — CMP14+EGFR
ALBUMIN: 3.8 g/dL (ref 3.5–5.5)
ALT: 17 IU/L (ref 0–32)
AST: 15 IU/L (ref 0–40)
Albumin/Globulin Ratio: 1.4 (ref 1.2–2.2)
Alkaline Phosphatase: 108 IU/L (ref 39–117)
BILIRUBIN TOTAL: 0.5 mg/dL (ref 0.0–1.2)
BUN / CREAT RATIO: 24 — AB (ref 9–23)
BUN: 18 mg/dL (ref 6–24)
CO2: 19 mmol/L — ABNORMAL LOW (ref 20–29)
Calcium: 9 mg/dL (ref 8.7–10.2)
Chloride: 102 mmol/L (ref 96–106)
Creatinine, Ser: 0.74 mg/dL (ref 0.57–1.00)
GFR calc Af Amer: 106 mL/min/{1.73_m2} (ref >59)
GFR calc non Af Amer: 92 mL/min/{1.73_m2} (ref >59)
GLUCOSE: 85 mg/dL (ref 65–99)
Globulin, Total: 2.8 g/dL (ref 1.5–4.5)
Potassium: 3.8 mmol/L (ref 3.5–5.2)
SODIUM: 140 mmol/L (ref 134–144)
Total Protein: 6.6 g/dL (ref 6.0–8.5)

## 2017-07-28 LAB — TSH: TSH: 0.184 u[IU]/mL — ABNORMAL LOW (ref 0.450–4.500)

## 2017-07-28 LAB — VITAMIN B12: Vitamin B-12: 1397 pg/mL — ABNORMAL HIGH (ref 232–1245)

## 2017-08-03 NOTE — Progress Notes (Signed)
Called patient regarding low TSH. Currently taking 100 mcg Synthroid qd. Will decrease to 50 mcg qd. Instructed patient to cut current tablets in half. Patient just picked up a three month supply of 100 mcg tablets, which now will last 6 months as she is cutting them in half. As such, she is requesting that an additional refill not be sent in at this time, and will contact us in 6 months when she runs out. Will discontinue 100 mcg tabs at that time and write for 50 mcg tabs instead. Would also recommend rechecking TSH around that time to ensure appropriate dose.   Adin Hector, MD, MPH PGY-3 [facility] Pager [phone]

## 2017-10-31 ENCOUNTER — Other Ambulatory Visit: Payer: Self-pay | Admitting: Family Medicine

## 2017-10-31 DIAGNOSIS — Z1231 Encounter for screening mammogram for malignant neoplasm of breast: Secondary | ICD-10-CM

## 2017-11-21 ENCOUNTER — Ambulatory Visit: Payer: BLUE CROSS/BLUE SHIELD | Admitting: Skilled Nursing Facility1

## 2017-11-28 ENCOUNTER — Ambulatory Visit: Payer: BLUE CROSS/BLUE SHIELD | Admitting: Skilled Nursing Facility1

## 2017-12-10 ENCOUNTER — Ambulatory Visit
Admission: RE | Admit: 2017-12-10 | Discharge: 2017-12-10 | Disposition: A | Discharge status: Home / Self Care | Payer: BLUE CROSS/BLUE SHIELD | Source: Ambulatory Visit | Attending: Family Medicine | Admitting: Family Medicine

## 2017-12-10 DIAGNOSIS — Z1231 Encounter for screening mammogram for malignant neoplasm of breast: Secondary | ICD-10-CM

## 2017-12-11 ENCOUNTER — Telehealth: Payer: Self-pay

## 2017-12-11 DIAGNOSIS — Z1211 Encounter for screening for malignant neoplasm of colon: Secondary | ICD-10-CM

## 2017-12-11 DIAGNOSIS — Z800 Family history of malignant neoplasm of digestive organs: Secondary | ICD-10-CM

## 2017-12-11 NOTE — Telephone Encounter (Signed)
Patient has family history of colon cancer and as such has colonoscopies performed q77yrs. Referral placed. Patient will be called to inform her of this.   Adin Hector, MD, MPH PGY-3 [facility] Pager [phone]

## 2017-12-11 NOTE — Telephone Encounter (Signed)
Called and left voice message for patient concerning referral for colonoscopy.  Gail Cruz, [name]

## 2017-12-11 NOTE — Telephone Encounter (Signed)
Patient left message on nurse line requesting referral for colonoscopy. States she has to have every five years. Call back is [phone]. Danley Danker, RN [facility] RN)

## 2017-12-12 ENCOUNTER — Telehealth: Payer: Self-pay | Admitting: Internal Medicine

## 2017-12-12 NOTE — Telephone Encounter (Signed)
Pt needs a referral/order to get a colonoscopy. She's supposed to get one every 5 years and she said it's been 5 years since her last one. Please let patient know when this has been placed.

## 2017-12-12 NOTE — Telephone Encounter (Signed)
Referral was placed yesterday. She was called and informed of this by voicemail yesterday (see note by Leonia Corona CMA at [phone] 12/11/17).  Adin Hector, MD, MPH PGY-3 [facility] Pager [phone]

## 2017-12-12 NOTE — Telephone Encounter (Signed)
Patient wants a referral for a colonoscopy.  Gail Cruz, [place]

## 2017-12-14 ENCOUNTER — Ambulatory Visit: Payer: BLUE CROSS/BLUE SHIELD

## 2017-12-19 ENCOUNTER — Encounter: Payer: Self-pay | Admitting: Skilled Nursing Facility1

## 2017-12-19 ENCOUNTER — Encounter: Payer: BLUE CROSS/BLUE SHIELD | Attending: General Surgery | Admitting: Skilled Nursing Facility1

## 2017-12-19 DIAGNOSIS — E669 Obesity, unspecified: Secondary | ICD-10-CM | POA: Insufficient documentation

## 2017-12-19 DIAGNOSIS — Z713 Dietary counseling and surveillance: Secondary | ICD-10-CM | POA: Diagnosis present

## 2017-12-19 DIAGNOSIS — Z6835 Body mass index (BMI) 35.0-35.9, adult: Secondary | ICD-10-CM | POA: Diagnosis not present

## 2017-12-19 NOTE — Progress Notes (Signed)
  Medical Nutrition Therapy:  Appt start time: 210 end time:  300  Primary concerns today: Post-operative Bariatric Surgery Nutrition Management.  pt does smoke.  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  Pt states if she does not eat she will get a headache. Pt states she has been taking ibuprofen but once educated states she will not take it anymore. Pt states se cannot follow up with her surgeon due to out of pocket costs. Pt states she still wants to get skin removal surgery.   Surgery date: 07/13/2014 Surgery type: RYGB Start weight at [facility]: 292.5 lbs on 05/29/2014 Weight today: 187.8  TANITA  BODY COMP RESULTS  11/17/15 05/16/16   BMI (kg/m^2) 33.9 34.2   Fat Mass (lbs) 72.5 71.6   Fat Free Mass (lbs) 107.0 109.2   Total Body Water (lbs) 78.5 77.4     24-hr recall: B (9-9:30AM): [facility] Protein bar or eggs, 2 pieces [place] bacon with rye toast toast (10 g) ----none most of the time Snk (9-9:30AM):trail mix or protein bar L (12PM): sandwich thins with ham and cheese and lettuce and tomato and pickles and 1/2 cup chips or or chicken/tuna salad with egg with fat free mayo with triscuits or 3 oz pork chops (14-21 g) Snk (PM): protein bar/fruit/trail mix or lately pumpkin bread, raisins, sometimes ice cream (0-10 g) or sugar free pudding  D (7 PM): 2-3 oz chicken/pork chop/beans with sauteed vegetables sometimes with yams and slaw or chicken/tuna salad with egg with fat free mayo with triscuits protein bar (14-21 g) or smoked sausage with onion or peppers and crackers  Snk (PM): none   Fluid intake: 60 oz water, 8 oz 1% milk, diet green tea, sweet tea/pepsi/orange juice  soda= 50-60+ oz Estimated total protein intake: ~60 g  Medications: see list Supplementation: taking a flinstone with iron and calcium and b12 once a day and thiamin  Using straws: Yes  Drinking  while eating: No Hair loss: No Carbonated beverages: Pepsi  N/V/D/C: No Dumping syndrome:  no Having you been chewing well: maybe Chewing/swallowing difficulties: no Changes in vision: no Changes to mood/headaches: no Hair loss/Changes to skin/Changes to nails: no Any difficulty focusing or concentrating: no Sweating: no Dizziness/Lightheaded: no Palpitations: no   Recent physical activity:  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  Handouts given out during appointment: High Protein Snacks   Nutritional Diagnosis:  [place]-3.3 Overweight/obesity related to past poor dietary habits and physical inactivity as evidenced by patient w/ recent RYGB surgery following dietary guidelines for continued weight loss.    Intervention:  Nutrition education/diet reinforcement Goals: Take the specific multivitamin Make an appointment with your surgeon Teaching Method Utilized:  Visual Auditory Hands on  Barriers to learning/adherence to lifestyle change: none  Demonstrated degree of understanding via:  Teach Back   Monitoring/Evaluation:  Dietary intake, exercise, and body weight.

## 2018-01-17 ENCOUNTER — Encounter: Payer: Self-pay | Admitting: Internal Medicine

## 2018-01-22 ENCOUNTER — Telehealth: Payer: Self-pay | Admitting: Gastroenterology

## 2018-01-22 NOTE — Telephone Encounter (Signed)
Ok to schedule at [facility]. Thanks

## 2018-01-22 NOTE — Telephone Encounter (Signed)
This was not me  Beth did you call this patient

## 2018-01-22 NOTE — Telephone Encounter (Signed)
I looked through this chart. It looks like she is due a colonoscopy recall August 2019.  She was last screened 5 years ago. At that time her BMI was 53. She has lost a lot of weight and has a BMI in the 20's now.  If Dr Silverio Decamp agrees, she should be scheduled in the [facility] for her screening colonoscopy. But to answer the original question, I did not call her. Thanks

## 2018-01-23 ENCOUNTER — Encounter: Payer: Self-pay | Admitting: Gastroenterology

## 2018-01-31 ENCOUNTER — Encounter: Payer: Self-pay | Admitting: Gastroenterology

## 2018-03-26 ENCOUNTER — Ambulatory Visit (AMBULATORY_SURGERY_CENTER): Payer: Self-pay | Admitting: *Deleted

## 2018-03-26 VITALS — Ht 61.0 in | Wt 186.6 lb

## 2018-03-26 DIAGNOSIS — Z800 Family history of malignant neoplasm of digestive organs: Secondary | ICD-10-CM

## 2018-03-26 MED ORDER — NA SULFATE-K SULFATE-MG SULF 17.5-3.13-1.6 GM/177ML PO SOLN: 1.0000 | Freq: Once | ORAL | 0 refills | Status: AC

## 2018-03-26 NOTE — Progress Notes (Signed)
Denies allergies to eggs or soy products. Denies complications with sedation or anesthesia. Denies O2 use. Denies use of diet or weight loss medications.  Emmi instructions not given for colonoscopy. Pt does not use Internet.  Pay no more than $50 coupon given for Suprep

## 2018-03-29 ENCOUNTER — Encounter: Payer: Self-pay | Admitting: Gastroenterology

## 2018-04-12 ENCOUNTER — Encounter: Payer: Self-pay | Admitting: Gastroenterology

## 2018-04-12 ENCOUNTER — Ambulatory Visit (AMBULATORY_SURGERY_CENTER): Payer: BLUE CROSS/BLUE SHIELD | Admitting: Gastroenterology

## 2018-04-12 VITALS — BP 133/71 | HR 51 | Temp 97.8°F | Resp 15 | Ht 61.0 in | Wt 177.0 lb

## 2018-04-12 DIAGNOSIS — Z800 Family history of malignant neoplasm of digestive organs: Secondary | ICD-10-CM | POA: Diagnosis not present

## 2018-04-12 DIAGNOSIS — D122 Benign neoplasm of ascending colon: Secondary | ICD-10-CM | POA: Diagnosis not present

## 2018-04-12 DIAGNOSIS — Z1211 Encounter for screening for malignant neoplasm of colon: Secondary | ICD-10-CM | POA: Diagnosis present

## 2018-04-12 DIAGNOSIS — D120 Benign neoplasm of cecum: Secondary | ICD-10-CM

## 2018-04-12 DIAGNOSIS — K635 Polyp of colon: Secondary | ICD-10-CM | POA: Diagnosis not present

## 2018-04-12 DIAGNOSIS — D125 Benign neoplasm of sigmoid colon: Secondary | ICD-10-CM

## 2018-04-12 MED ORDER — SODIUM CHLORIDE 0.9 % IV SOLN: 500.0000 mL | Freq: Once | INTRAVENOUS | Status: DC

## 2018-04-12 NOTE — Patient Instructions (Signed)
YOU HAD AN ENDOSCOPIC PROCEDURE TODAY AT [facility]:   Refer to the procedure report that was given to you for any specific questions about what was found during the examination.  If the procedure report does not answer your questions, please call your gastroenterologist to clarify.  If you requested that your care partner not be given the details of your procedure findings, then the procedure report has been included in a sealed envelope for you to review at your convenience later.  YOU SHOULD EXPECT: Some feelings of bloating in the abdomen. Passage of more gas than usual.  Walking can help get rid of the air that was put into your GI tract during the procedure and reduce the bloating. If you had a lower endoscopy (such as a colonoscopy or flexible sigmoidoscopy) you may notice spotting of blood in your stool or on the toilet paper. If you underwent a bowel prep for your procedure, you may not have a normal bowel movement for a few days.  Please Note:  You might notice some irritation and congestion in your nose or some drainage.  This is from the oxygen used during your procedure.  There is no need for concern and it should clear up in a day or so.  SYMPTOMS TO REPORT IMMEDIATELY:   Following lower endoscopy (colonoscopy or flexible sigmoidoscopy):  Excessive amounts of blood in the stool  Significant tenderness or worsening of abdominal pains  Swelling of the abdomen that is new, acute  Fever of 100F or higher   Following upper endoscopy (EGD)  Vomiting of blood or coffee ground material  New chest pain or pain under the shoulder blades  Painful or persistently difficult swallowing  New shortness of breath  Fever of 100F or higher  Black, tarry-looking stools  For urgent or emergent issues, a gastroenterologist can be reached at any hour by calling [phone].   DIET:  We do recommend a small meal at first, but then you may proceed to your regular diet.  Drink  plenty of fluids but you should avoid alcoholic beverages for 24 hours.  ACTIVITY:  You should plan to take it easy for the rest of today and you should NOT DRIVE or use heavy machinery until tomorrow (because of the sedation medicines used during the test).    FOLLOW UP: Our staff will call the number listed on your records the next business day following your procedure to check on you and address any questions or concerns that you may have regarding the information given to you following your procedure. If we do not reach you, we will leave a message.  However, if you are feeling well and you are not experiencing any problems, there is no need to return our call.  We will assume that you have returned to your regular daily activities without incident.  If any biopsies were taken you will be contacted by phone or by letter within the next 1-3 weeks.  Please call us at [phone] if you have not heard about the biopsies in 3 weeks.    SIGNATURES/CONFIDENTIALITY: You and/or your care partner have signed paperwork which will be entered into your electronic medical record.  These signatures attest to the fact that that the information above on your After Visit Summary has been reviewed and is understood.  Full responsibility of the confidentiality of this discharge information lies with you and/or your care-partner.  Polyp, diverticulosis, hemorrhoid information given.

## 2018-04-12 NOTE — Op Note (Signed)
[facility] Patient Name: Gail Cruz Procedure Date: 04/12/2018 8:04 AM MRN: [id number] Endoscopist: Mauri Pole , MD Age: 57 Referring MD:  Date of Birth: Jun 09, 1961 Gender: Female Account #: [id number] Procedure:                Colonoscopy Indications:              Screening in patient at increased risk: Family                            history of 1st-degree relative with colorectal                            cancer Medicines:                Monitored Anesthesia Care Procedure:                Pre-Anesthesia Assessment:                           - Prior to the procedure, a History and Physical                            was performed, and patient medications and                            allergies were reviewed. The patient's tolerance of                            previous anesthesia was also reviewed. The risks                            and benefits of the procedure and the sedation                            options and risks were discussed with the patient.                            All questions were answered, and informed consent                            was obtained. Prior Anticoagulants: The patient has                            taken no previous anticoagulant or antiplatelet                            agents. ASA Grade Assessment: III - A patient with                            severe systemic disease. After reviewing the risks                            and benefits, the patient was deemed in  satisfactory condition to undergo the procedure.                           After obtaining informed consent, the colonoscope                            was passed under direct vision. Throughout the                            procedure, the patient's blood pressure, pulse, and                            oxygen saturations were monitored continuously. The                            Model PCF-H190DL [id number]) scope was introduced                             through the anus and advanced to the the terminal                            ileum, with identification of the appendiceal                            orifice and IC valve. The colonoscopy was performed                            without difficulty. The patient tolerated the                            procedure well. The quality of the bowel                            preparation was excellent. The terminal ileum,                            ileocecal valve, appendiceal orifice, and rectum                            were photographed. Scope In: 8:08:22 AM Scope Out: 8:27:40 AM Scope Withdrawal Time: 0 hours 14 minutes 4 seconds  Total Procedure Duration: 0 hours 19 minutes 18 seconds  Findings:                 The perianal and digital rectal examinations were                            normal.                           Two sessile polyps were found in the ascending                            colon and cecum. The polyps were 5 to 14 mm in  size. These polyps were removed with a cold snare.                            Resection and retrieval were complete.                           Two sessile polyps were found in the sigmoid colon.                            The polyps were 1 to 3 mm in size. These polyps                            were removed with a cold biopsy forceps. Resection                            and retrieval were complete.                           A few small-mouthed diverticula were found in the                            sigmoid colon.                           Non-bleeding internal hemorrhoids were found during                            retroflexion. The hemorrhoids were small. Complications:            No immediate complications. Estimated Blood Loss:     Estimated blood loss was minimal. Impression:               - Two 5 to 14 mm polyps in the ascending colon and                            in the cecum, removed with a  cold snare. Resected                            and retrieved.                           - Two 1 to 3 mm polyps in the sigmoid colon,                            removed with a cold biopsy forceps. Resected and                            retrieved.                           - Diverticulosis in the sigmoid colon.                           - Non-bleeding internal hemorrhoids. Recommendation:           -  Patient has a contact number available for                            emergencies. The signs and symptoms of potential                            delayed complications were discussed with the                            patient. Return to normal activities tomorrow.                            Written discharge instructions were provided to the                            patient.                           - Resume previous diet.                           - Continue present medications.                           - Await pathology results.                           - Repeat colonoscopy in 3 years for surveillance                            based on pathology results. Mauri Pole, MD 04/12/2018 8:34:47 AM This report has been signed electronically.

## 2018-04-12 NOTE — Progress Notes (Signed)
Called to room to assist during endoscopic procedure.  Patient ID and intended procedure confirmed with present staff. Received instructions for my participation in the procedure from the performing physician.Called to room to assist during endoscopic procedure.   

## 2018-04-12 NOTE — Progress Notes (Signed)
A and O x3. Report to RN. Tolerated MAC anesthesia well.

## 2018-04-12 NOTE — Progress Notes (Signed)
Pt's states no medical or surgical changes since previsit or office visit. 

## 2018-04-15 ENCOUNTER — Telehealth: Payer: Self-pay | Admitting: *Deleted

## 2018-04-15 NOTE — Telephone Encounter (Signed)
Attempted post procedure follow up call.  No answer.  Will attempt to call again this afternoon.

## 2018-04-15 NOTE — Telephone Encounter (Signed)
No answer for post procedure second call back. Left message for patient to call with questions or concerns. Sm

## 2018-04-22 ENCOUNTER — Encounter: Payer: Self-pay | Admitting: Gastroenterology

## 2018-05-17 ENCOUNTER — Encounter: Payer: Self-pay | Admitting: Family Medicine

## 2018-05-17 ENCOUNTER — Other Ambulatory Visit: Payer: Self-pay

## 2018-05-17 ENCOUNTER — Ambulatory Visit: Payer: BLUE CROSS/BLUE SHIELD | Admitting: Family Medicine

## 2018-05-17 DIAGNOSIS — Z720 Tobacco use: Secondary | ICD-10-CM

## 2018-05-17 DIAGNOSIS — Z23 Encounter for immunization: Secondary | ICD-10-CM

## 2018-05-17 DIAGNOSIS — R911 Solitary pulmonary nodule: Secondary | ICD-10-CM | POA: Diagnosis not present

## 2018-05-17 DIAGNOSIS — E039 Hypothyroidism, unspecified: Secondary | ICD-10-CM | POA: Diagnosis not present

## 2018-05-17 DIAGNOSIS — R1013 Epigastric pain: Secondary | ICD-10-CM | POA: Diagnosis not present

## 2018-05-17 NOTE — Assessment & Plan Note (Signed)
Patient continues to smoke.  Today she asks about the pneumococcal vaccine.  She has just received a flu vaccine today and would like to get the pneumococcal vaccine at her annual checkup which would be after December 21.

## 2018-05-17 NOTE — Assessment & Plan Note (Signed)
Patient would like TSH to be checked on her annual visit after December 21.

## 2018-05-17 NOTE — Progress Notes (Signed)
SUBJECTIVE:  PCP: [place] Bing, DO Patient ID: MRN [id number]  Date of birth: 04/02/1961  HPI Gail Cruz is a 57 y.o. female who presents to clinic with chief complaint of vomiting.    #Vomiting Patient reports that on Tuesday, October 1, she was experiencing some sharp pain in her epigastric area which caused her to throw up on her drive home.  After throwing up, she no longer felt the pain and felt better.  She reports no episodes of vomiting since.  She is concerned because she has a history of a hernia. She has also been previously diagnosed with reflux disease and reports that she has had a similar episode that was found to be reflux.  She denies fevers, chills,continued abdominal pain, bloody bowel movements, vomiting since the episode.  Review of Symptoms: See HPI  HISTORY Medications & Allergies: Reviewed with patient and updated in EMR as appropriate.   Past Medical & Surgical History:  Patient Active Problem List   Diagnosis Date Noted  . Epidermal inclusion cyst 01/14/2016  . Hydradenitis 04/06/2015  . GERD (gastroesophageal reflux disease) 02/19/2015  . S/P gastric bypass 07/27/2014  . Family history of malignant neoplasm of gastrointestinal tract 02/21/2013  . Preventative health care 11/08/2012  . Hyperlipidemia 06/12/2010  . Hypothyroidism 10/27/2008  . Obesity (BMI 30.0-34.9) 10/27/2008    Family History:  family history includes Breast cancer (age of onset: 23) in her mother; Colon cancer in her father. There is no history of Esophageal cancer, Rectal cancer, or Stomach cancer.  Social History:   reports that she has been smoking cigarettes. She has been smoking about 0.33 packs per day. She has never used smokeless tobacco. She reports that she does not drink alcohol or use drugs.  OBJECTIVE:  BP 132/76   Pulse 73   Temp 98.4 F (36.9 C) (Oral)   Ht 5\' 1"  (1.549 m)   Wt 186 lb (84.4 kg)   LMP 03/08/2015   SpO2 98%   BMI 35.14 kg/m   Physical  Exam:  Gen: NAD, alert, non-toxic, well-appearing, sitting comfortably  Skin: Warm and dry. No obvious rashes, lesions, or trauma. HEENT: NCAT No conjunctival pallor or injection. No scleral icterus or injection.  MMM.  CV: RRR.  Normal S1-S2.  RP & DPs 2+ bilaterally. No BLEE. Resp: CTAB.  No wheezing, rales, abnormal lung sounds.  No increased WOB Abd: Obese.  NTND on palpation to all 4 quadrants.  Positive bowel sounds.  Infraumbilical palpable fat mass. Psych: Cooperative with exam. Pleasant. Makes eye contact. Speech normal. Extremities: Moves all extremities spontaneously  Neuro: CN II-XII grossly intact. No FNDs.   Pertinent Labs & Imaging:   Reviewed in chart   ASSESSMENT & PLAN:  Abdominal pain Patient reports one episode of vomiting that was consistent with a previous episode that was diagnosed as reflux.  Patient has not had any abdominal pain, bloody bowel movements, continued vomiting since.  Very likely caused by a benign pathology such as reflux or nausea.  No suspicion for incarcerated hernia.  Gave patient return precautions for incarcerated hernia  Hypothyroidism Patient would like TSH to be checked on her annual visit after December 21.  Tobacco abuse Patient continues to smoke.  Today she asks about the pneumococcal vaccine.  She has just received a flu vaccine today and would like to get the pneumococcal vaccine at her annual checkup which would be after December 21.  Incidental lung nodule, > 58mm and < 24mm Patient has not had  repeat CT chest since 2016 for 6 mm incidental lung nodule.  Ordered CT chest without contrast  Patient reports that if her insurance covers this test she will go.  However she is unable to pay out of pocket, she will forego the imaging.   She will follow-up with her provider at her annual visit about the status of this imaging    Zettie Cooley, M.D. [place]  PGY -1 05/17/2018, 3:40 PM

## 2018-05-17 NOTE — Patient Instructions (Signed)
Dear Gail Cruz,   It was nice to see you today! I am glad you came in for your concerns. This document serves as a "wrap-up" to all that we discussed today and is listed as follows:    Vomiting   Since you had the episode of vomiting once, you have no fevers, chills, repeated episodes, I am not concerned about any part of your intestines protruding out of your hernia.   At your next visit, you will have a TSH drawn and receive a pneumonia vaccine.  Please let your provider know if you were able to get the CT covered by your insurance.  If so, your provider will look out for your results.   Thank you for choosing [facility] for your primary care needs and stay well!   Best,   Dr. Zettie Cooley Resident Physician, PGY-1 [facility] [phone]    Don't forget to sign up for MyChart for instant access to your health profile, labs, orders, upcoming appointments or to contact your provider with questions. Stop at the front desk on the way out for more information about how to sign up!

## 2018-05-17 NOTE — Assessment & Plan Note (Signed)
Patient reports one episode of vomiting that was consistent with a previous episode that was diagnosed as reflux.  Patient has not had any abdominal pain, bloody bowel movements, continued vomiting since.  Very likely caused by a benign pathology such as reflux or nausea.  No suspicion for incarcerated hernia.  Gave patient return precautions for incarcerated hernia

## 2018-05-17 NOTE — Assessment & Plan Note (Signed)
Patient has not had repeat CT chest since 2016 for 6 mm incidental lung nodule.  Ordered CT chest without contrast  Patient reports that if her insurance covers this test she will go.  However she is unable to pay out of pocket, she will forego the imaging.   She will follow-up with her provider at her annual visit about the status of this imaging

## 2018-05-18 ENCOUNTER — Encounter: Payer: Self-pay | Admitting: Family Medicine

## 2018-05-29 ENCOUNTER — Emergency Department (HOSPITAL_COMMUNITY): Payer: BLUE CROSS/BLUE SHIELD | Admitting: Anesthesiology

## 2018-05-29 ENCOUNTER — Encounter (HOSPITAL_COMMUNITY): Admission: EM | Disposition: A | Discharge status: Home / Self Care | Payer: Self-pay | Source: Home / Self Care

## 2018-05-29 ENCOUNTER — Telehealth: Payer: Self-pay | Admitting: *Deleted

## 2018-05-29 ENCOUNTER — Emergency Department (HOSPITAL_COMMUNITY): Payer: BLUE CROSS/BLUE SHIELD

## 2018-05-29 ENCOUNTER — Encounter (HOSPITAL_COMMUNITY): Payer: Self-pay

## 2018-05-29 ENCOUNTER — Inpatient Hospital Stay (HOSPITAL_COMMUNITY)
Admission: EM | Admit: 2018-05-29 | Discharge: 2018-05-31 | DRG: 355 | Disposition: A | Discharge status: Home / Self Care | Payer: BLUE CROSS/BLUE SHIELD | Attending: Surgery | Admitting: Surgery

## 2018-05-29 ENCOUNTER — Other Ambulatory Visit: Payer: Self-pay

## 2018-05-29 DIAGNOSIS — Z6830 Body mass index (BMI) 30.0-30.9, adult: Secondary | ICD-10-CM

## 2018-05-29 DIAGNOSIS — Z881 Allergy status to other antibiotic agents status: Secondary | ICD-10-CM | POA: Diagnosis not present

## 2018-05-29 DIAGNOSIS — Z79890 Hormone replacement therapy (postmenopausal): Secondary | ICD-10-CM | POA: Diagnosis not present

## 2018-05-29 DIAGNOSIS — E119 Type 2 diabetes mellitus without complications: Secondary | ICD-10-CM | POA: Diagnosis present

## 2018-05-29 DIAGNOSIS — Z9049 Acquired absence of other specified parts of digestive tract: Secondary | ICD-10-CM | POA: Diagnosis not present

## 2018-05-29 DIAGNOSIS — Z803 Family history of malignant neoplasm of breast: Secondary | ICD-10-CM

## 2018-05-29 DIAGNOSIS — K430 Incisional hernia with obstruction, without gangrene: Secondary | ICD-10-CM | POA: Diagnosis present

## 2018-05-29 DIAGNOSIS — Z9884 Bariatric surgery status: Secondary | ICD-10-CM | POA: Diagnosis not present

## 2018-05-29 DIAGNOSIS — E669 Obesity, unspecified: Secondary | ICD-10-CM | POA: Diagnosis present

## 2018-05-29 DIAGNOSIS — K436 Other and unspecified ventral hernia with obstruction, without gangrene: Secondary | ICD-10-CM

## 2018-05-29 DIAGNOSIS — I10 Essential (primary) hypertension: Secondary | ICD-10-CM | POA: Diagnosis present

## 2018-05-29 DIAGNOSIS — Z800 Family history of malignant neoplasm of digestive organs: Secondary | ICD-10-CM | POA: Diagnosis not present

## 2018-05-29 DIAGNOSIS — E039 Hypothyroidism, unspecified: Secondary | ICD-10-CM | POA: Diagnosis present

## 2018-05-29 DIAGNOSIS — Z79899 Other long term (current) drug therapy: Secondary | ICD-10-CM | POA: Diagnosis not present

## 2018-05-29 DIAGNOSIS — F40240 Claustrophobia: Secondary | ICD-10-CM | POA: Diagnosis present

## 2018-05-29 DIAGNOSIS — E785 Hyperlipidemia, unspecified: Secondary | ICD-10-CM | POA: Diagnosis present

## 2018-05-29 DIAGNOSIS — F17210 Nicotine dependence, cigarettes, uncomplicated: Secondary | ICD-10-CM | POA: Diagnosis present

## 2018-05-29 DIAGNOSIS — K420 Umbilical hernia with obstruction, without gangrene: Secondary | ICD-10-CM | POA: Diagnosis present

## 2018-05-29 DIAGNOSIS — K219 Gastro-esophageal reflux disease without esophagitis: Secondary | ICD-10-CM | POA: Diagnosis present

## 2018-05-29 DIAGNOSIS — R109 Unspecified abdominal pain: Secondary | ICD-10-CM | POA: Diagnosis present

## 2018-05-29 DIAGNOSIS — K56609 Unspecified intestinal obstruction, unspecified as to partial versus complete obstruction: Secondary | ICD-10-CM | POA: Diagnosis present

## 2018-05-29 DIAGNOSIS — K56600 Partial intestinal obstruction, unspecified as to cause: Secondary | ICD-10-CM

## 2018-05-29 HISTORY — PX: LAPAROTOMY: SHX154

## 2018-05-29 LAB — COMPREHENSIVE METABOLIC PANEL
ALK PHOS: 82 U/L (ref 38–126)
ALT: 28 U/L (ref 0–44)
AST: 30 U/L (ref 15–41)
Albumin: 3.8 g/dL (ref 3.5–5.0)
Anion gap: 6 (ref 5–15)
BUN: 13 mg/dL (ref 6–20)
CALCIUM: 9.3 mg/dL (ref 8.9–10.3)
CO2: 27 mmol/L (ref 22–32)
CREATININE: 0.88 mg/dL (ref 0.44–1.00)
Chloride: 103 mmol/L (ref 98–111)
GFR calc Af Amer: >60 mL/min (ref >60)
Glucose, Bld: 128 mg/dL — ABNORMAL HIGH (ref 70–99)
Potassium: 4.8 mmol/L (ref 3.5–5.1)
Sodium: 136 mmol/L (ref 135–145)
Total Bilirubin: 1.1 mg/dL (ref 0.3–1.2)
Total Protein: 7.1 g/dL (ref 6.5–8.1)

## 2018-05-29 LAB — LIPASE, BLOOD: Lipase: 36 U/L (ref 11–51)

## 2018-05-29 LAB — CBC
HCT: 45.3 % (ref 36.0–46.0)
Hemoglobin: 14.3 g/dL (ref 12.0–15.0)
MCH: 29.2 pg (ref 26.0–34.0)
MCHC: 31.6 g/dL (ref 30.0–36.0)
MCV: 92.4 fL (ref 80.0–100.0)
PLATELETS: 206 10*3/uL (ref 150–400)
RBC: 4.9 MIL/uL (ref 3.87–5.11)
RDW: 13.1 % (ref 11.5–15.5)
WBC: 8.6 10*3/uL (ref 4.0–10.5)
nRBC: 0 % (ref 0.0–0.2)

## 2018-05-29 SURGERY — LAPAROTOMY, EXPLORATORY
Anesthesia: General | Site: Abdomen

## 2018-05-29 MED ORDER — LIDOCAINE HCL (CARDIAC) PF 100 MG/5ML IV SOSY
PREFILLED_SYRINGE | INTRAVENOUS | Status: DC | PRN
  Administered 2018-05-29: 80 mg via INTRATRACHEAL

## 2018-05-29 MED ORDER — DEXAMETHASONE SODIUM PHOSPHATE 10 MG/ML IJ SOLN
INTRAMUSCULAR | Status: DC | PRN
  Administered 2018-05-29: 10 mg via INTRAVENOUS

## 2018-05-29 MED ORDER — ROCURONIUM 10MG/ML (10ML) SYRINGE FOR MEDFUSION PUMP - OPTIME
INTRAVENOUS | Status: DC | PRN
  Administered 2018-05-29: 40 mg via INTRAVENOUS

## 2018-05-29 MED ORDER — 0.9 % SODIUM CHLORIDE (POUR BTL) OPTIME
TOPICAL | Status: DC | PRN
  Administered 2018-05-29: 1000 mL

## 2018-05-29 MED ORDER — SUCCINYLCHOLINE CHLORIDE 200 MG/10ML IV SOSY
PREFILLED_SYRINGE | INTRAVENOUS | Status: AC
  Filled 2018-05-29: qty 10

## 2018-05-29 MED ORDER — ROCURONIUM BROMIDE 50 MG/5ML IV SOSY
PREFILLED_SYRINGE | INTRAVENOUS | Status: AC
  Filled 2018-05-29: qty 5

## 2018-05-29 MED ORDER — SUCCINYLCHOLINE CHLORIDE 20 MG/ML IJ SOLN
INTRAMUSCULAR | Status: DC | PRN
  Administered 2018-05-29: 70 mg via INTRAVENOUS

## 2018-05-29 MED ORDER — IOHEXOL 300 MG/ML  SOLN
100.0000 mL | Freq: Once | INTRAMUSCULAR | Status: AC | PRN
  Administered 2018-05-29: 100 mL via INTRAVENOUS

## 2018-05-29 MED ORDER — PROPOFOL 10 MG/ML IV BOLUS
INTRAVENOUS | Status: AC
  Filled 2018-05-29: qty 20

## 2018-05-29 MED ORDER — CEFAZOLIN SODIUM-DEXTROSE 2-4 GM/100ML-% IV SOLN
2.0000 g | Freq: Once | INTRAVENOUS | Status: AC
  Administered 2018-05-29: 2 g via INTRAVENOUS
  Filled 2018-05-29: qty 100

## 2018-05-29 MED ORDER — LACTATED RINGERS IV SOLN
INTRAVENOUS | Status: DC | PRN
  Administered 2018-05-29: via INTRAVENOUS

## 2018-05-29 MED ORDER — LIDOCAINE 2% (20 MG/ML) 5 ML SYRINGE
INTRAMUSCULAR | Status: AC
  Filled 2018-05-29: qty 5

## 2018-05-29 MED ORDER — FENTANYL CITRATE (PF) 250 MCG/5ML IJ SOLN
INTRAMUSCULAR | Status: DC | PRN
  Administered 2018-05-29: 100 ug via INTRAVENOUS

## 2018-05-29 MED ORDER — FENTANYL CITRATE (PF) 250 MCG/5ML IJ SOLN
INTRAMUSCULAR | Status: AC
  Filled 2018-05-29: qty 5

## 2018-05-29 MED ORDER — HYDROMORPHONE HCL 1 MG/ML IJ SOLN
0.5000 mg | Freq: Once | INTRAMUSCULAR | Status: AC
  Administered 2018-05-29: 0.5 mg via INTRAVENOUS
  Filled 2018-05-29: qty 1

## 2018-05-29 MED ORDER — MIDAZOLAM HCL 2 MG/2ML IJ SOLN
INTRAMUSCULAR | Status: AC
  Filled 2018-05-29: qty 2

## 2018-05-29 MED ORDER — MIDAZOLAM HCL 2 MG/2ML IJ SOLN
INTRAMUSCULAR | Status: DC | PRN
  Administered 2018-05-29: 2 mg via INTRAVENOUS

## 2018-05-29 MED ORDER — SODIUM CHLORIDE 0.9 % IV SOLN
INTRAVENOUS | Status: DC | PRN
  Administered 2018-05-29: 23:00:00 via INTRAVENOUS

## 2018-05-29 MED ORDER — PROPOFOL 10 MG/ML IV BOLUS
INTRAVENOUS | Status: DC | PRN
  Administered 2018-05-29: 110 mg via INTRAVENOUS

## 2018-05-29 MED ORDER — DEXAMETHASONE SODIUM PHOSPHATE 10 MG/ML IJ SOLN
INTRAMUSCULAR | Status: AC
  Filled 2018-05-29: qty 1

## 2018-05-29 MED ORDER — PHENYLEPHRINE HCL 10 MG/ML IJ SOLN
INTRAMUSCULAR | Status: DC | PRN
  Administered 2018-05-29 (×2): 80 ug via INTRAVENOUS

## 2018-05-29 MED ORDER — ONDANSETRON HCL 4 MG/2ML IJ SOLN
4.0000 mg | Freq: Once | INTRAMUSCULAR | Status: AC
  Administered 2018-05-29: 4 mg via INTRAVENOUS
  Filled 2018-05-29: qty 2

## 2018-05-29 MED ORDER — ONDANSETRON HCL 4 MG/2ML IJ SOLN
INTRAMUSCULAR | Status: AC
  Filled 2018-05-29: qty 2

## 2018-05-29 SURGICAL SUPPLY — 40 items
BLADE CLIPPER SURG (BLADE) IMPLANT
CANISTER SUCT 3000ML PPV (MISCELLANEOUS) ×2 IMPLANT
CHLORAPREP W/TINT 26ML (MISCELLANEOUS) ×2 IMPLANT
COVER SURGICAL LIGHT HANDLE (MISCELLANEOUS) ×2 IMPLANT
COVER WAND RF STERILE (DRAPES) ×1 IMPLANT
DRAPE LAPAROSCOPIC ABDOMINAL (DRAPES) ×2 IMPLANT
DRAPE WARM FLUID 44X44 (DRAPE) ×1 IMPLANT
DRSG OPSITE POSTOP 4X10 (GAUZE/BANDAGES/DRESSINGS) ×1 IMPLANT
DRSG OPSITE POSTOP 4X8 (GAUZE/BANDAGES/DRESSINGS) IMPLANT
ELECT BLADE 6.5 EXT (BLADE) ×1 IMPLANT
ELECT CAUTERY BLADE 6.4 (BLADE) IMPLANT
ELECT REM PT RETURN 9FT ADLT (ELECTROSURGICAL) ×2
ELECTRODE REM PT RTRN 9FT ADLT (ELECTROSURGICAL) ×1 IMPLANT
GLOVE BIO SURGEON STRL SZ7 (GLOVE) ×2 IMPLANT
GLOVE BIOGEL PI IND STRL 7.5 (GLOVE) ×1 IMPLANT
GLOVE BIOGEL PI INDICATOR 7.5 (GLOVE) ×1
GOWN STRL REUS W/ TWL LRG LVL3 (GOWN DISPOSABLE) ×2 IMPLANT
GOWN STRL REUS W/TWL LRG LVL3 (GOWN DISPOSABLE) ×4
KIT BASIN OR (CUSTOM PROCEDURE TRAY) ×2 IMPLANT
KIT TURNOVER KIT B (KITS) ×2 IMPLANT
LIGASURE IMPACT 36 18CM CVD LR (INSTRUMENTS) IMPLANT
NS IRRIG 1000ML POUR BTL (IV SOLUTION) ×4 IMPLANT
PACK GENERAL/GYN (CUSTOM PROCEDURE TRAY) ×2 IMPLANT
PAD ARMBOARD 7.5X6 YLW CONV (MISCELLANEOUS) ×2 IMPLANT
SPECIMEN JAR LARGE (MISCELLANEOUS) IMPLANT
SPONGE LAP 18X18 X RAY DECT (DISPOSABLE) IMPLANT
STAPLER VISISTAT 35W (STAPLE) ×2 IMPLANT
SUCTION POOLE TIP (SUCTIONS) ×2 IMPLANT
SUT PDS AB 1 TP1 96 (SUTURE) ×4 IMPLANT
SUT SILK 2 0 (SUTURE) ×2
SUT SILK 2 0 SH CR/8 (SUTURE) ×2 IMPLANT
SUT SILK 2-0 18XBRD TIE 12 (SUTURE) ×1 IMPLANT
SUT SILK 3 0 (SUTURE) ×2
SUT SILK 3 0 SH CR/8 (SUTURE) ×2 IMPLANT
SUT SILK 3-0 18XBRD TIE 12 (SUTURE) ×1 IMPLANT
SUT VIC AB 3-0 SH 27 (SUTURE) ×2
SUT VIC AB 3-0 SH 27X BRD (SUTURE) IMPLANT
TOWEL OR 17X26 10 PK STRL BLUE (TOWEL DISPOSABLE) ×2 IMPLANT
TRAY FOLEY MTR SLVR 16FR STAT (SET/KITS/TRAYS/PACK) ×2 IMPLANT
YANKAUER SUCT BULB TIP NO VENT (SUCTIONS) IMPLANT

## 2018-05-29 NOTE — Anesthesia Preprocedure Evaluation (Addendum)
Anesthesia Evaluation  Patient identified by MRN, date of birth, ID band Patient awake    Reviewed: Allergy & Precautions, NPO status , Patient's Chart, lab work & pertinent test results  History of Anesthesia Complications Negative for: history of anesthetic complications  Airway Mallampati: I  TM Distance: >3 FB Neck ROM: Full    Dental  (+)    Pulmonary Current Smoker,    breath sounds clear to auscultation       Cardiovascular hypertension,  Rhythm:Regular  No longer taking HTN meds after gastric bypass surgery   Neuro/Psych  Headaches, PSYCHIATRIC DISORDERS Anxiety Depression No meds. Resolved.   GI/Hepatic hiatal hernia, GERD  ,S/p gastric bypass, incarcerated hernia   Endo/Other  diabetesHypothyroidism Morbid obesityNo longer taking meds for diabetes after gastric bypass surgery.  Renal/GU      Musculoskeletal   Abdominal   Peds  Hematology negative hematology ROS (+)   Anesthesia Other Findings   Reproductive/Obstetrics                           Anesthesia Physical Anesthesia Plan  ASA: II and emergent  Anesthesia Plan: General   Post-op Pain Management:    Induction: Intravenous, Rapid sequence and Cricoid pressure planned  PONV Risk Score and Plan: 2 and Dexamethasone and Ondansetron  Airway Management Planned: Oral ETT  Additional Equipment: None  Intra-op Plan:   Post-operative Plan: Extubation in OR  Informed Consent: I have reviewed the patients History and Physical, chart, labs and discussed the procedure including the risks, benefits and alternatives for the proposed anesthesia with the patient or authorized representative who has indicated his/her understanding and acceptance.   Dental advisory given  Plan Discussed with: Anesthesiologist, Surgeon and CRNA  Anesthesia Plan Comments:        Anesthesia Quick Evaluation

## 2018-05-29 NOTE — H&P (Signed)
Gail Cruz is an 57 y.o. female.   Chief Complaint: abd pain HPI: 75 yof with history of uh repair and lap gastric bypass in 10-25-13 by Dr Excell Seltzer.  She has done well but has several episodes of abd pain and n/v lately. She was seen by pcp earlier this month and actually had ct for later this week.  Today she woke up with n/v and abd pain that has not improved. She notes a bulge that wont go away below umbilicus.  Not eating.  Doesn't remember if had flatus. Came to er and had ct scan that shows incarcerated hernia  Past Medical History:  Diagnosis Date  . Claustrophobia   . Depression Oct 25, 2001  . Diabetes mellitus without complication ([place])   . GERD (gastroesophageal reflux disease)    occasional  . Headache(784.0)   . History of hiatal hernia   . Hyperlipidemia   . Hypertension   . Hypothyroidism     Past Surgical History:  Procedure Laterality Date  . BREATH TEK H PYLORI N/A 01/14/2014   Procedure: BREATH TEK H PYLORI;  Surgeon: Edward Jolly, MD;  Location: [facility];  Service: General;  Laterality: N/A;  . [place]   at [place]  . CHOLECYSTECTOMY    . COLONOSCOPY N/A 02/21/2013   Procedure: COLONOSCOPY;  Surgeon: Inda Castle, MD;  Location: [facility];  Service: Endoscopy;  Laterality: N/A;  . GASTRIC ROUX-EN-Y N/A 07/13/2014   Procedure: LAPAROSCOPIC ROUX-EN-Y GASTRIC BYPASS WITH UPPER ENDOSCOPY,umbilical hernia repair;  Surgeon: Excell Seltzer, MD;  Location: [facility] ORS;  Service: General;  Laterality: N/A;    Family History  Problem Relation Age of Onset  . Colon cancer Father   . Breast cancer Mother 61       died 10/25/2010  . Esophageal cancer Neg Hx   . Rectal cancer Neg Hx   . Stomach cancer Neg Hx    Social History:  reports that she has been smoking cigarettes. She has been smoking about 0.33 packs per day. She has never used smokeless tobacco. She reports that she does not drink alcohol or use drugs.  Allergies:  Allergies  Allergen Reactions   . Ciprofloxacin     REACTION: Rash     (Not in a hospital admission)  Results for orders placed or performed during the hospital encounter of 05/29/18 (from the past 48 hour(s))  Lipase, blood     Status: None   Collection Time: 05/29/18  4:45 PM  Result Value Ref Range   Lipase 36 11 - 51 U/L    Comment: Performed at [facility], [address] [place], [place] [address]  Comprehensive metabolic panel     Status: Abnormal   Collection Time: 05/29/18  4:45 PM  Result Value Ref Range   Sodium 136 135 - 145 mmol/L   Potassium 4.8 3.5 - 5.1 mmol/L   Chloride 103 98 - 111 mmol/L   CO2 27 22 - 32 mmol/L   Glucose, Bld 128 (H) 70 - 99 mg/dL   BUN 13 6 - 20 mg/dL   Creatinine, Ser 0.88 0.44 - 1.00 mg/dL   Calcium 9.3 8.9 - 10.3 mg/dL   Total Protein 7.1 6.5 - 8.1 g/dL   Albumin 3.8 3.5 - 5.0 g/dL   AST 30 15 - 41 U/L   ALT 28 0 - 44 U/L   Alkaline Phosphatase 82 38 - 126 U/L   Total Bilirubin 1.1 0.3 - 1.2 mg/dL   GFR  calc non Af Amer >60 >60 mL/min   GFR calc Af Amer >60 >60 mL/min    Comment: (NOTE) The eGFR has been calculated using the CKD EPI equation. This calculation has not been validated in all clinical situations. eGFR's persistently <60 mL/min signify possible Chronic Kidney Disease.    Anion gap 6 5 - 15    Comment: Performed at [facility] [address] [place], [place] [address]  CBC     Status: None   Collection Time: 05/29/18  4:45 PM  Result Value Ref Range   WBC 8.6 4.0 - 10.5 K/uL   RBC 4.90 3.87 - 5.11 MIL/uL   Hemoglobin 14.3 12.0 - 15.0 g/dL   HCT 45.3 36.0 - 46.0 %   MCV 92.4 80.0 - 100.0 fL   MCH 29.2 26.0 - 34.0 pg   MCHC 31.6 30.0 - 36.0 g/dL   RDW 13.1 11.5 - 15.5 %   Platelets 206 150 - 400 K/uL   nRBC 0.0 0.0 - 0.2 %    Comment: Performed at [facility], [place] [address], [place], [place] [address]   Ct Abdomen Pelvis W Contrast  Result Date: 05/29/2018 CLINICAL DATA:  Generalized abdominal pain and vomiting.  EXAM: CT ABDOMEN AND PELVIS WITH CONTRAST TECHNIQUE: Multidetector CT imaging of the abdomen and pelvis was performed using the standard protocol following bolus administration of intravenous contrast. CONTRAST:  154m OMNIPAQUE IOHEXOL 300 MG/ML  SOLN COMPARISON:  08/28/2014 FINDINGS: Lower chest: Normal. Hepatobiliary: Previous cholecystectomy. Liver and biliary tree are within normal. Pancreas: Normal. Spleen: Normal. Adrenals/Urinary Tract: Adrenal glands are normal. Kidneys are normal in size without hydronephrosis or nephrolithiasis. Ureters and bladder are normal. Stomach/Bowel: Postsurgical change over the stomach compatible previous gastric bypass surgery. Surgical suture line over a small bowel loop in the left upper abdomen. There multiple fluid-filled mildly dilated small bowel loops. There is a midline ventral hernia just below the umbilicus containing a loop of small bowel likely site of obstruction. Appendix is normal. Colon is unremarkable. Air and stool present throughout the colon. Vascular/Lymphatic: Mild calcified plaque over the distal abdominal aorta. No adenopathy. Reproductive: Normal. Other: None. Musculoskeletal: Mild degenerative change of the spine and hips. IMPRESSION: Likely incarcerated hernia with early/partial small bowel obstruction with transition point over a midline ventral hernia just below the umbilicus containing a short segment loop of small bowel. Postsurgical changes as described. Aortic Atherosclerosis (ICD10-I70.0). Electronically Signed   By: DMarin OlpM.D.   On: 05/29/2018 21:13    Review of Systems  Gastrointestinal: Positive for abdominal pain, nausea and vomiting.  All other systems reviewed and are negative.   Blood pressure (!) 156/88, pulse (!) 58, temperature 98.9 F (37.2 C), temperature source Oral, resp. rate 18, last menstrual period 03/08/2015, SpO2 100 %. Physical Exam  Vitals reviewed. Constitutional: She is oriented to person, place, and  time. She appears well-developed and well-nourished.  HENT:  Head: Normocephalic and atraumatic.  Eyes: No scleral icterus.  Cardiovascular: Normal rate and regular rhythm.  Respiratory: Effort normal and breath sounds normal.  GI: Soft. She exhibits no distension. There is tenderness in the periumbilical area. A hernia (incarcerated tender) is present. Hernia confirmed positive in the ventral area.  Multiple well healed incisions  Neurological: She is alert and oriented to person, place, and time.  Skin: Skin is warm and dry.  Psychiatric: Her behavior is normal. Judgment normal.     Assessment/Plan Incarcerated tender incisional hernia  I cannot reduce  this. She is tender and has ongoing abdominal pain. I think she needs to go to or for elap, primary repair likely of hernia, possible mesh.   Discussed goal is to relieve sbo and reduce hernia. May not be able to use mesh which is what she needs for durable repair.  Discussed likely hernia returning if this is the case.  Risks and surgery both discussed with patient. Will proceed  Rolm Bookbinder, MD 05/29/2018, 10:05 PM

## 2018-05-29 NOTE — Telephone Encounter (Signed)
Pt wanted to let Dr. Maudie Mercury know that her pain is worse today.  She is doubled over, vomiting and cant keep anything down.  She is 5 minutes from the ED now.  Will forward to MD as FYI. [place], Salome Spotted, CMA

## 2018-05-29 NOTE — ED Triage Notes (Signed)
Pt states abdominal pain, episode of vomiting. Pt states known hernia. Pt states she is supposed to have CT scan Friday but was told to come here for worsening pain.

## 2018-05-29 NOTE — Anesthesia Procedure Notes (Signed)
Procedure Name: Intubation Date/Time: 05/29/2018 11:24 PM Performed by: Valetta Fuller, CRNA Pre-anesthesia Checklist: Patient identified, Emergency Drugs available, Suction available and Patient being monitored Patient Re-evaluated:Patient Re-evaluated prior to induction Oxygen Delivery Method: Circle system utilized Preoxygenation: Pre-oxygenation with 100% oxygen Induction Type: IV induction, Rapid sequence and Cricoid Pressure applied Laryngoscope Size: Miller and 2 Grade View: Grade I Tube type: Oral Tube size: 7.0 mm Number of attempts: 1 Airway Equipment and Method: Stylet Placement Confirmation: ETT inserted through vocal cords under direct vision,  positive ETCO2 and breath sounds checked- equal and bilateral Secured at: 23 cm Tube secured with: Tape Dental Injury: Teeth and Oropharynx as per pre-operative assessment

## 2018-05-29 NOTE — ED Notes (Signed)
ED Provider at bedside. 

## 2018-05-29 NOTE — ED Provider Notes (Signed)
[facility] EMERGENCY DEPARTMENT Provider Note   CSN: [id number] Arrival date & time: 05/29/18  1627     History   Chief Complaint Chief Complaint  Patient presents with  . Abdominal Pain    HPI Gail Cruz is a 57 y.o. female with a history of GERD, obesity s/p gastric bypass, hidradenitis, hypothyroidism, and DM Type II who presents to the emergency department with a chief complaint of abdominal pain.  The patient endorses constant, nonradiating, periumbilical abdominal pain that began this morning.  States that she was doubled over due to the intensity of the pain this morning.  Pain has been worsening since onset.  She has also noticed a "bulge" in her abdomen in the area where she has a known ventral hernia. Reports associated 2-3 episodes of nonbloody, nonbilious emesis today.  She denies of fever, chills, dysuria, vaginal pain, itching, or discharge, diarrhea, back pain.  She reports her last bowel movement was yesterday.  She does not recall having passed any flatus today.  She last ate >12 hours ago.   The history is provided by the patient. No language interpreter was used.    Past Medical History:  Diagnosis Date  . Claustrophobia   . Depression 2003  . Diabetes mellitus without complication ([place])   . GERD (gastroesophageal reflux disease)    occasional  . Headache(784.0)   . History of hiatal hernia   . Hyperlipidemia   . Hypertension   . Hypothyroidism     Patient Active Problem List   Diagnosis Date Noted  . Incidental lung nodule, > 64mm and < 29mm 05/17/2018  . Epidermal inclusion cyst 01/14/2016  . Hydradenitis 04/06/2015  . GERD (gastroesophageal reflux disease) 02/19/2015  . Abdominal pain   . S/P gastric bypass 07/27/2014  . Family history of malignant neoplasm of gastrointestinal tract 02/21/2013  . Preventative health care 11/08/2012  . Hyperlipidemia 06/12/2010  . Tobacco abuse 02/10/2010  . Hypothyroidism 10/27/2008  .  Obesity (BMI 30.0-34.9) 10/27/2008    Past Surgical History:  Procedure Laterality Date  . BREATH TEK H PYLORI N/A 01/14/2014   Procedure: BREATH TEK H PYLORI;  Surgeon: Edward Jolly, MD;  Location: [facility];  Service: General;  Laterality: N/A;  . [facility]   at [place]  . CHOLECYSTECTOMY    . COLONOSCOPY N/A 02/21/2013   Procedure: COLONOSCOPY;  Surgeon: Inda Castle, MD;  Location: [facility];  Service: Endoscopy;  Laterality: N/A;  . GASTRIC ROUX-EN-Y N/A 07/13/2014   Procedure: LAPAROSCOPIC ROUX-EN-Y GASTRIC BYPASS WITH UPPER ENDOSCOPY,umbilical hernia repair;  Surgeon: Excell Seltzer, MD;  Location: [facility]S;  Service: General;  Laterality: N/A;     OB History   None      Home Medications    Prior to Admission medications   Medication Sig Start Date End Date Taking? Authorizing Provider  acetaminophen (TYLENOL) 500 MG tablet Take 500-1,000 mg by mouth every 6 (six) hours as needed for headache.   Yes [provider]  Calcium Citrate 200 MG TABS Take 200 mg by mouth 3 (three) times daily.    Yes [provider]  levothyroxine (SYNTHROID, LEVOTHROID) 100 MCG tablet TAKE 1 TABLET BY MOUTH DAILY 04/12/17  Yes Verner Mould, MD  Multiple Vitamins-Minerals (MULTIVITAMIN) tablet Take 1 tablet by mouth daily. Patient taking differently: Take 1 tablet by mouth 2 (two) times daily.  08/30/14  Yes Leeanne Rio, MD  Thiamine HCl (VITAMIN B-1) 250 MG tablet Take 250 mg  by mouth daily.   Yes [provider]  vitamin B-12 (CYANOCOBALAMIN) 500 MCG tablet Take 500 mcg by mouth daily.    Yes [provider]    Family History Family History  Problem Relation Age of Onset  . Colon cancer Father   . Breast cancer Mother 28       died 2010/11/21  . Esophageal cancer Neg Hx   . Rectal cancer Neg Hx   . Stomach cancer Neg Hx     Social History Social History   Tobacco Use  . Smoking status: Current Every Day Smoker     Packs/day: 0.33    Types: Cigarettes  . Smokeless tobacco: Never Used  Substance Use Topics  . Alcohol use: No  . Drug use: No     Allergies   Ciprofloxacin   Review of Systems Review of Systems  Constitutional: Negative for activity change, chills and fever.  Respiratory: Negative for shortness of breath.   Cardiovascular: Negative for chest pain.  Gastrointestinal: Positive for abdominal pain, constipation, nausea and vomiting. Negative for anal bleeding, blood in stool and diarrhea.  Genitourinary: Negative for dysuria, frequency, pelvic pain, vaginal bleeding, vaginal discharge and vaginal pain.  Musculoskeletal: Negative for back pain.  Skin: Negative for rash.  Allergic/Immunologic: Negative for immunocompromised state.  Neurological: Negative for headaches.  Psychiatric/Behavioral: Negative for confusion.     Physical Exam Updated Vital Signs BP (!) 156/88   Pulse (!) 58   Temp 98.9 F (37.2 C) (Oral)   Resp 18   LMP 03/08/2015   SpO2 100%   Physical Exam  Constitutional: No distress.  HENT:  Head: Normocephalic.  Eyes: Conjunctivae are normal.  Neck: Neck supple.  Cardiovascular: Normal rate, regular rhythm and normal heart sounds. Exam reveals no gallop and no friction rub.  No murmur heard. Pulmonary/Chest: Effort normal. No stridor. No respiratory distress. She has no wheezes. She has no rales. She exhibits no tenderness.  Abdominal: Soft. She exhibits no distension. Bowel sounds are absent. There is tenderness in the periumbilical area. A hernia is present. Hernia confirmed positive in the ventral area.  Ventral periumbilical hernia that is unable to be reduced.  She is exquisitely tender to palpation around the site of the hernia.  Bowel sounds are absent in all 4 quadrants.  Neurological: She is alert.  Skin: Skin is warm. No rash noted.  Psychiatric: Her behavior is normal.  Nursing note and vitals reviewed.    ED Treatments / Results   Labs (all labs ordered are listed, but only abnormal results are displayed) Labs Reviewed  COMPREHENSIVE METABOLIC PANEL - Abnormal; Notable for the following components:      Result Value   Glucose, Bld 128 (*)    All other components within normal limits  LIPASE, BLOOD  CBC  URINALYSIS, ROUTINE W REFLEX MICROSCOPIC    EKG None  Radiology Ct Abdomen Pelvis W Contrast  Result Date: 05/29/2018 CLINICAL DATA:  Generalized abdominal pain and vomiting. EXAM: CT ABDOMEN AND PELVIS WITH CONTRAST TECHNIQUE: Multidetector CT imaging of the abdomen and pelvis was performed using the standard protocol following bolus administration of intravenous contrast. CONTRAST:  124mL OMNIPAQUE IOHEXOL 300 MG/ML  SOLN COMPARISON:  08/28/2014 FINDINGS: Lower chest: Normal. Hepatobiliary: Previous cholecystectomy. Liver and biliary tree are within normal. Pancreas: Normal. Spleen: Normal. Adrenals/Urinary Tract: Adrenal glands are normal. Kidneys are normal in size without hydronephrosis or nephrolithiasis. Ureters and bladder are normal. Stomach/Bowel: Postsurgical change over the stomach compatible previous gastric bypass surgery.  Surgical suture line over a small bowel loop in the left upper abdomen. There multiple fluid-filled mildly dilated small bowel loops. There is a midline ventral hernia just below the umbilicus containing a loop of small bowel likely site of obstruction. Appendix is normal. Colon is unremarkable. Air and stool present throughout the colon. Vascular/Lymphatic: Mild calcified plaque over the distal abdominal aorta. No adenopathy. Reproductive: Normal. Other: None. Musculoskeletal: Mild degenerative change of the spine and hips. IMPRESSION: Likely incarcerated hernia with early/partial small bowel obstruction with transition point over a midline ventral hernia just below the umbilicus containing a short segment loop of small bowel. Postsurgical changes as described. Aortic Atherosclerosis  (ICD10-I70.0). Electronically Signed   By: Marin Olp M.D.   On: 05/29/2018 21:13    Procedures Procedures (including critical care time)  Medications Ordered in ED Medications  ceFAZolin (ANCEF) IVPB 2g/100 mL premix (has no administration in time range)  HYDROmorphone (DILAUDID) injection 0.5 mg (has no administration in time range)  ondansetron (ZOFRAN) injection 4 mg (4 mg Intravenous Given 05/29/18 2016)  HYDROmorphone (DILAUDID) injection 0.5 mg (0.5 mg Intravenous Given 05/29/18 2018)  iohexol (OMNIPAQUE) 300 MG/ML solution 100 mL (100 mLs Intravenous Contrast Given 05/29/18 2040)  HYDROmorphone (DILAUDID) injection 0.5 mg (0.5 mg Intravenous Given 05/29/18 2147)     Initial Impression / Assessment and Plan / ED Course  I have reviewed the triage vital signs and the nursing notes.  Pertinent labs & imaging results that were available during my care of the patient were reviewed by me and considered in my medical decision making (see chart for details).     57 year old female history of GERD, obesity s/p gastric bypass, hidradenitis, hypothyroidism, and DM Type II prestenting with abdominal pain, nausea, and vomiting, onset today.  On exam, the patient is exquisitely tender and she has a ventral hernia that is unable to be reduced.  Dilaudid given for pain control.  Zofran given for nausea.  Labs are reassuring.  CT abdomen pelvis with likely incarcerated hernia with early/partial small bowel obstruction with transition point over midline ventral hernia just below the umbilicus containing a short segment loop of small bowel.  Attempted bedside reduction of the ventral hernia without success.  Consulted general surgery and spoke with Dr. Donne Hazel who plans to take the patient to the OR. The patient appears reasonably stabilized for admission considering the current resources, flow, and capabilities available in the ED at this time, and I doubt any other [facility] requiring further  screening and/or treatment in the ED prior to admission.  Final Clinical Impressions(s) / ED Diagnoses   Final diagnoses:  Incarcerated ventral hernia  Partial small bowel obstruction [facility])    ED Discharge Orders    None       Savien Mamula A, PA-C 05/29/18 2241    Sherwood Gambler, MD 05/30/18 0000

## 2018-05-30 ENCOUNTER — Other Ambulatory Visit: Payer: Self-pay

## 2018-05-30 ENCOUNTER — Encounter (HOSPITAL_COMMUNITY): Payer: Self-pay | Admitting: General Surgery

## 2018-05-30 DIAGNOSIS — K56609 Unspecified intestinal obstruction, unspecified as to partial versus complete obstruction: Secondary | ICD-10-CM | POA: Diagnosis present

## 2018-05-30 LAB — GLUCOSE, CAPILLARY: GLUCOSE-CAPILLARY: 118 mg/dL — AB (ref 70–99)

## 2018-05-30 MED ORDER — OXYCODONE HCL 5 MG/5ML PO SOLN: 5.0000 mg | Freq: Once | ORAL | Status: DC | PRN

## 2018-05-30 MED ORDER — SUGAMMADEX SODIUM 500 MG/5ML IV SOLN
INTRAVENOUS | Status: AC
  Filled 2018-05-30: qty 5

## 2018-05-30 MED ORDER — SUGAMMADEX SODIUM 500 MG/5ML IV SOLN
INTRAVENOUS | Status: DC | PRN
  Administered 2018-05-30: 350 mg via INTRAVENOUS

## 2018-05-30 MED ORDER — ACETAMINOPHEN 500 MG PO TABS
1000.0000 mg | ORAL_TABLET | Freq: Four times a day (QID) | ORAL | Status: DC
  Administered 2018-05-30 – 2018-05-31 (×5): 1000 mg via ORAL
  Filled 2018-05-30 (×6): qty 2

## 2018-05-30 MED ORDER — ONDANSETRON HCL 4 MG/2ML IJ SOLN
INTRAMUSCULAR | Status: DC | PRN
  Administered 2018-05-30: 4 mg via INTRAVENOUS

## 2018-05-30 MED ORDER — SIMETHICONE 80 MG PO CHEW: 40.0000 mg | CHEWABLE_TABLET | Freq: Four times a day (QID) | ORAL | Status: DC | PRN

## 2018-05-30 MED ORDER — OXYCODONE HCL 5 MG PO TABS: 5.0000 mg | ORAL_TABLET | Freq: Once | ORAL | Status: DC | PRN

## 2018-05-30 MED ORDER — HYDRALAZINE HCL 20 MG/ML IJ SOLN: 10.0000 mg | INTRAMUSCULAR | Status: DC | PRN

## 2018-05-30 MED ORDER — METHOCARBAMOL 500 MG PO TABS
500.0000 mg | ORAL_TABLET | Freq: Four times a day (QID) | ORAL | Status: DC | PRN
  Administered 2018-05-30 – 2018-05-31 (×3): 500 mg via ORAL
  Filled 2018-05-30 (×3): qty 1

## 2018-05-30 MED ORDER — FENTANYL CITRATE (PF) 100 MCG/2ML IJ SOLN
25.0000 ug | INTRAMUSCULAR | Status: DC | PRN
  Administered 2018-05-30: 25 ug via INTRAVENOUS

## 2018-05-30 MED ORDER — MAGNESIUM CITRATE PO SOLN: 1.0000 | Freq: Once | ORAL | Status: DC | PRN

## 2018-05-30 MED ORDER — ONDANSETRON 4 MG PO TBDP: 4.0000 mg | ORAL_TABLET | Freq: Four times a day (QID) | ORAL | Status: DC | PRN

## 2018-05-30 MED ORDER — ACETAMINOPHEN 160 MG/5ML PO SOLN: 1000.0000 mg | Freq: Once | ORAL | Status: DC | PRN

## 2018-05-30 MED ORDER — SODIUM CHLORIDE 0.9 % IV SOLN
INTRAVENOUS | Status: DC
  Administered 2018-05-30 (×2): via INTRAVENOUS

## 2018-05-30 MED ORDER — OXYCODONE HCL 5 MG PO TABS
5.0000 mg | ORAL_TABLET | ORAL | Status: DC | PRN
  Administered 2018-05-30 – 2018-05-31 (×5): 10 mg via ORAL
  Filled 2018-05-30 (×5): qty 2

## 2018-05-30 MED ORDER — FENTANYL CITRATE (PF) 100 MCG/2ML IJ SOLN
INTRAMUSCULAR | Status: AC
  Filled 2018-05-30: qty 2

## 2018-05-30 MED ORDER — LEVOTHYROXINE SODIUM 100 MCG PO TABS
100.0000 ug | ORAL_TABLET | Freq: Every day | ORAL | Status: DC
  Administered 2018-05-30 – 2018-05-31 (×2): 100 ug via ORAL
  Filled 2018-05-30 (×2): qty 1

## 2018-05-30 MED ORDER — ENOXAPARIN SODIUM 40 MG/0.4ML ~~LOC~~ SOLN
40.0000 mg | SUBCUTANEOUS | Status: DC
  Administered 2018-05-31: 40 mg via SUBCUTANEOUS
  Filled 2018-05-30: qty 0.4

## 2018-05-30 MED ORDER — ACETAMINOPHEN 10 MG/ML IV SOLN
1000.0000 mg | Freq: Once | INTRAVENOUS | Status: DC | PRN
  Filled 2018-05-30: qty 100

## 2018-05-30 MED ORDER — BISACODYL 10 MG RE SUPP: 10.0000 mg | Freq: Every day | RECTAL | Status: DC | PRN

## 2018-05-30 MED ORDER — MORPHINE SULFATE (PF) 2 MG/ML IV SOLN: 2.0000 mg | INTRAVENOUS | Status: DC | PRN

## 2018-05-30 MED ORDER — ONDANSETRON HCL 4 MG/2ML IJ SOLN
4.0000 mg | Freq: Four times a day (QID) | INTRAMUSCULAR | Status: DC | PRN
  Administered 2018-05-30: 4 mg via INTRAVENOUS
  Filled 2018-05-30: qty 2

## 2018-05-30 MED ORDER — ACETAMINOPHEN 500 MG PO TABS: 1000.0000 mg | ORAL_TABLET | Freq: Once | ORAL | Status: DC | PRN

## 2018-05-30 NOTE — Discharge Instructions (Signed)
[facility]      [facility] [phone]  OPEN ABDOMINAL SURGERY: POST OP INSTRUCTIONS  Always review your discharge instruction sheet given to you by the facility where your surgery was performed.  IF YOU HAVE DISABILITY OR FAMILY LEAVE FORMS, YOU MUST BRING THEM TO THE OFFICE FOR PROCESSING.  PLEASE DO NOT GIVE THEM TO YOUR DOCTOR.  1. A prescription for pain medication may be given to you upon discharge.  Take your pain medication as prescribed, if needed.  If narcotic pain medicine is not needed, then you may take acetaminophen (Tylenol) or ibuprofen (Advil) as needed. 2. Take your usually prescribed medications unless otherwise directed. 3. If you need a refill on your pain medication, please contact your pharmacy. They will contact our office to request authorization.  Prescriptions will not be filled after 5pm or on week-ends. 4. You should follow a light diet the first few days after arrival home, such as soup and crackers, pudding, etc.unless your doctor has advised otherwise. A high-fiber, low fat diet can be resumed as tolerated.   Be sure to include lots of fluids daily. Most patients will experience some swelling and bruising on the chest and neck area.  Ice packs will help.  Swelling and bruising can take several days to resolve 5. Most patients will experience some swelling and bruising in the area of the incision. Ice pack will help. Swelling and bruising can take several days to resolve..  6. It is common to experience some constipation if taking pain medication after surgery.  Increasing fluid intake and taking a stool softener will usually help or prevent this problem from occurring.  A mild laxative (Milk of Magnesia or Miralax) should be taken according to package directions if there are no bowel movements after 48 hours. 7.  You may have steri-strips (small skin tapes) in place directly over the incision.  These strips should be left on the skin for 7-10 days.  If your  surgeon used skin glue on the incision, you may shower in 24 hours.  The glue will flake off over the next 2-3 weeks.  Any sutures or staples will be removed at the office during your follow-up visit. You may find that a light gauze bandage over your incision may keep your staples from being rubbed or pulled. You may shower and replace the bandage daily. 8. ACTIVITIES:  You may resume regular (light) daily activities beginning the next day--such as daily self-care, walking, climbing stairs--gradually increasing activities as tolerated.  You may have sexual intercourse when it is comfortable.  Refrain from any heavy lifting or straining until approved by your doctor. a. You may drive when you no longer are taking prescription pain medication, you can comfortably wear a seatbelt, and you can safely maneuver your car and apply brakes b. Return to Work: ___________________________________ 9. You should see your doctor in the office for a follow-up appointment approximately two weeks after your surgery.  Make sure that you call for this appointment within a day or two after you arrive home to insure a convenient appointment time. OTHER INSTRUCTIONS:  _____________________________________________________________ _____________________________________________________________  WHEN TO CALL YOUR DOCTOR: 1. Fever over 101.0 2. Inability to urinate 3. Nausea and/or vomiting 4. Extreme swelling or bruising 5. Continued bleeding from incision. 6. Increased pain, redness, or drainage from the incision. 7. Difficulty swallowing or breathing 8. Muscle cramping or spasms. 9. Numbness or tingling in hands or feet or around lips.  The clinic staff is available to   answer your questions during regular business hours.  Please don't hesitate to call and ask to speak to one of the nurses if you have concerns.  For further questions, please visit [email or web address]   

## 2018-05-30 NOTE — Telephone Encounter (Signed)
Patient admitted for incarcerated hernia. Getting repair by general surgery. Will follow.  [place], [place], PGY-3

## 2018-05-30 NOTE — Op Note (Signed)
Preoperative diagnosis: small bowel obstruction, incarcerated incisional hernia Postoperative diagnosis: same as above Procedure: laparotomy with primary repair of incarcerated incisional hernia Surgeon: Dr Serita Grammes Asst: Magnus Ivan Anesthesia: General Estimated blood loss: 50 cc Specimens: Hernia sac to pathology Complications: None Drains: None Sponge needle count was correct x2 at end of operation Disposition to recovery stable  Indications: This is a 57 year old female with a prior history of a laparoscopic gastric bypass and an umbilical hernia repair that was done primarily in 2015.  She has had progressive symptoms of nausea vomiting.  She has a CT scan that shows a small bowel obstruction likely from an incarcerated incisional hernia just below the umbilicus.  On exam this is tender and palpable.  It is not reducible.  I discussed with her going to the operating room for laparotomy and likely primary repair of this hernia.  Procedure: After informed consent was obtained the patient was taken the operating room.  She was given antibiotics.  SCDs were in place.  She was placed under general anesthesia without complication.  Foley catheter was placed.  She was prepped and draped in the standard sterile surgical fashion.  A surgical timeout was then performed.  I made a midline incision surrounding the umbilicus.  I carried this down to the fascia.  I took care overlying the hernia not to injure any bowel.  I was able to enter into the peritoneal cavity superiorly.  The bowel portion of this appeared to have reduced.  There was omentum that was still incarcerated in the hernia.  She did have dilated bowel and decompressed bowel present.  I was able to find where the bowel it appeared to be incarcerated and this was all viable.  I ran the bowel to the couple times to ensure that this was the case.  I then entered into the hernia sac.  I then elected to excise the entire hernia sac.   There was some murky fluid present and some necrotic fat.  Due to this I elected to just fix this primarily.  Once I had excised the sac I then closed the fascia with #1 looped PDS and some intermittent #1 Novafil's as well.  Hemostasis was obtained.  I then closed the skin with staples.  A honeycomb dressing was placed.  She tolerated this well was extubated and transferred to recovery stable.

## 2018-05-30 NOTE — Progress Notes (Signed)
[facility] Progress Note  1 Day Post-Op  Subjective: CC:  C/o some mild abd pain. Tolerating clears without N/V. Denies flatus or BM since surgery. Has not been OOB yet. Urinating with purewick in place.   Objective: Vital signs in last 24 hours: Temp:  [97.2 F (36.2 C)-98.9 F (37.2 C)] 97.9 F (36.6 C) (10/24 0730) Pulse Rate:  [52-84] 68 (10/24 0730) Resp:  [15-20] 18 (10/24 0730) BP: (112-156)/(67-97) 122/79 (10/24 0730) SpO2:  [97 %-100 %] 100 % (10/24 0730) Last BM Date: 05/29/18  Intake/Output from previous day: 10/23 0701 - 10/24 0700 In: 1626 [P.O.:480; I.V.:1146] Out: 650 [Urine:600; Blood:50] Intake/Output this shift: Total I/O In: 485.1 [P.O.:360; I.V.:125.1] Out: 400 [Urine:400]  PE: Gen:  Alert, NAD, pleasant Card:  Regular rate and rhythm Pulm:  Normal effort Abd: Soft, appropriately tender, midline incision with staples in place, sanguinous drainage on honeycomb but no active bleeding, +BS  Skin: warm and dry, no rashes  Psych: A&Ox3   Lab Results:  Recent Labs    05/29/18 1645  WBC 8.6  HGB 14.3  HCT 45.3  PLT 206   BMET Recent Labs    05/29/18 1645  NA 136  K 4.8  CL 103  CO2 27  GLUCOSE 128*  BUN 13  CREATININE 0.88  CALCIUM 9.3   PT/INR No results for input(s): LABPROT, INR in the last 72 hours. CMP     Component Value Date/Time   NA 136 05/29/2018 1645   NA 140 07/27/2017 0858   K 4.8 05/29/2018 1645   CL 103 05/29/2018 1645   CO2 27 05/29/2018 1645   GLUCOSE 128 (H) 05/29/2018 1645   BUN 13 05/29/2018 1645   BUN 18 07/27/2017 0858   CREATININE 0.88 05/29/2018 1645   CREATININE 0.72 06/21/2016 1426   CALCIUM 9.3 05/29/2018 1645   PROT 7.1 05/29/2018 1645   PROT 6.6 07/27/2017 0858   ALBUMIN 3.8 05/29/2018 1645   ALBUMIN 3.8 07/27/2017 0858   AST 30 05/29/2018 1645   ALT 28 05/29/2018 1645   ALKPHOS 82 05/29/2018 1645   BILITOT 1.1 05/29/2018 1645   BILITOT 0.5 07/27/2017 0858   GFRNONAA >60  05/29/2018 1645   GFRNONAA >89 06/21/2016 1426   GFRAA >60 05/29/2018 1645   GFRAA >89 06/21/2016 1426   Lipase     Component Value Date/Time   LIPASE 36 05/29/2018 1645       Studies/Results: Ct Abdomen Pelvis W Contrast  Result Date: 05/29/2018 CLINICAL DATA:  Generalized abdominal pain and vomiting. EXAM: CT ABDOMEN AND PELVIS WITH CONTRAST TECHNIQUE: Multidetector CT imaging of the abdomen and pelvis was performed using the standard protocol following bolus administration of intravenous contrast. CONTRAST:  128mL OMNIPAQUE IOHEXOL 300 MG/ML  SOLN COMPARISON:  08/28/2014 FINDINGS: Lower chest: Normal. Hepatobiliary: Previous cholecystectomy. Liver and biliary tree are within normal. Pancreas: Normal. Spleen: Normal. Adrenals/Urinary Tract: Adrenal glands are normal. Kidneys are normal in size without hydronephrosis or nephrolithiasis. Ureters and bladder are normal. Stomach/Bowel: Postsurgical change over the stomach compatible previous gastric bypass surgery. Surgical suture line over a small bowel loop in the left upper abdomen. There multiple fluid-filled mildly dilated small bowel loops. There is a midline ventral hernia just below the umbilicus containing a loop of small bowel likely site of obstruction. Appendix is normal. Colon is unremarkable. Air and stool present throughout the colon. Vascular/Lymphatic: Mild calcified plaque over the distal abdominal aorta. No adenopathy. Reproductive: Normal. Other: None. Musculoskeletal: Mild degenerative change of the spine  and hips. IMPRESSION: Likely incarcerated hernia with early/partial small bowel obstruction with transition point over a midline ventral hernia just below the umbilicus containing a short segment loop of small bowel. Postsurgical changes as described. Aortic Atherosclerosis (ICD10-I70.0). Electronically Signed   By: Marin Olp M.D.   On: 05/29/2018 21:13    Anti-infectives: Anti-infectives (From admission, onward)    Start     Dose/Rate Route Frequency Ordered Stop   05/29/18 2215  ceFAZolin (ANCEF) IVPB 2g/100 mL premix     2 g 200 mL/hr over 30 Minutes Intravenous  Once 05/29/18 2205 05/29/18 2313     Assessment/Plan Incarcerated incisional hernia POD#1 s/p exploratory laparotomy, primary repair incarcerated incisional hernia Dr. Donne Hazel 10/23 - afebrile, VSS - tolerating clears, advance to SOFT as tolerated - OOB/mobilize - PO pain control  - no active bleeding from midline incision, place a clean dry dressing   FEN: full liquids, advance to SOFT as tolerated ID: perioperative Ancef 10/23 VTE: SCD's, lovenox  Foley: discontinue purewick  Follow up: Dr. Donne Hazel  Dispo: mobilize and advance diet. D/C purewick. Possible discharge tomorrow.    LOS: 1 day    Obie Dredge, [facility] Pager: [phone]

## 2018-05-30 NOTE — Progress Notes (Signed)
Patient now tolerating po meds and pain meds well with clear liquids asking for chicken broth.  Patient now having some new drainage around Honeycomb dsg.  When dsg looked at underneath slight build up of blood near the navel area. Honeycomb dsg reapplied reinforced with abd dsg and gently paper taped to patients skin for Dr to assessed on rounds. Patient now talking on phone with family members. Able to void with Purwick .

## 2018-05-30 NOTE — Transfer of Care (Signed)
Immediate Anesthesia Transfer of Care Note  Patient: Gail Cruz  Procedure(s) Performed: EXPLORATORY LAPAROTOMY PRIMARY REPAIR OF INCARCERATED INCISIONAL HERNIA (N/A Abdomen)  Patient Location: PACU  Anesthesia Type:General  Level of Consciousness: sedated and patient cooperative  Airway & Oxygen Therapy: Patient Spontanous Breathing  Post-op Assessment: Report given to RN and Post -op Vital signs reviewed and stable  Post vital signs: Reviewed and stable  Last Vitals:  Vitals Value Taken Time  BP 123/81 05/30/2018 12:36 AM  Temp    Pulse 64 05/30/2018 12:38 AM  Resp 16 05/30/2018 12:38 AM  SpO2 99 % 05/30/2018 12:38 AM  Vitals shown include unvalidated device data.  Last Pain:  Vitals:   05/29/18 2238  TempSrc:   PainSc: 7          Complications: No apparent anesthesia complications

## 2018-05-31 ENCOUNTER — Encounter (HOSPITAL_COMMUNITY): Payer: Self-pay | Admitting: General Surgery

## 2018-05-31 ENCOUNTER — Other Ambulatory Visit: Payer: BLUE CROSS/BLUE SHIELD

## 2018-05-31 MED ORDER — OXYCODONE HCL 5 MG PO TABS: 5.0000 mg | ORAL_TABLET | ORAL | 0 refills | Status: AC | PRN

## 2018-05-31 NOTE — Anesthesia Postprocedure Evaluation (Signed)
Anesthesia Post Note  Patient: Gail Cruz  Procedure(s) Performed: EXPLORATORY LAPAROTOMY PRIMARY REPAIR OF INCARCERATED INCISIONAL HERNIA (N/A Abdomen)     Patient location during evaluation: PACU Anesthesia Type: General Level of consciousness: awake and alert Pain management: pain level controlled Vital Signs Assessment: post-procedure vital signs reviewed and stable Respiratory status: spontaneous breathing, nonlabored ventilation, respiratory function stable and patient connected to nasal cannula oxygen Cardiovascular status: blood pressure returned to baseline and stable Postop Assessment: no apparent nausea or vomiting Anesthetic complications: no    Last Vitals:  Vitals:   05/31/18 0358 05/31/18 0830  BP: 121/81 135/78  Pulse: (!) 57 (!) 59  Resp:    Temp: 36.6 C 36.7 C  SpO2: 94% 100%    Last Pain:  Vitals:   05/31/18 0830  TempSrc: Oral  PainSc:                  [name]

## 2018-05-31 NOTE — Progress Notes (Addendum)
Patient now passing flatus but with no BM.  Patient able to ambulate to bathroom with minimal to no assistance. Patient did require Oxycodone 10 mg q 4 hr for pain relief while ambulating 1x's during shift. Abdomen not as distended this am.  Patient states she hopes to go home today!

## 2018-05-31 NOTE — Discharge Summary (Addendum)
[facility] Discharge Summary   Patient ID: Gail Cruz MRN: [id number] DOB/AGE: Jan 18, 1961 57 y.o.  Admit date: 05/29/2018 Discharge date: 05/31/2018   Discharge Diagnosis Patient Active Problem List   Diagnosis Date Noted  . SBO (small bowel obstruction) ([place]) 05/30/2018  . Umbilical hernia, incarcerated 05/29/2018  . Incidental lung nodule, > 65mm and < 82mm 05/17/2018  . Epidermal inclusion cyst 01/14/2016  . Hydradenitis 04/06/2015  . GERD (gastroesophageal reflux disease) 02/19/2015  . Abdominal pain   . S/P gastric bypass 07/27/2014  . Family history of malignant neoplasm of gastrointestinal tract 02/21/2013  . Preventative health care 11/08/2012  . Hyperlipidemia 06/12/2010  . Tobacco abuse 02/10/2010  . Hypothyroidism 10/27/2008  . Obesity (BMI 30.0-34.9) 10/27/2008    Imaging: Ct Abdomen Pelvis W Contrast  Result Date: 05/29/2018 CLINICAL DATA:  Generalized abdominal pain and vomiting. EXAM: CT ABDOMEN AND PELVIS WITH CONTRAST TECHNIQUE: Multidetector CT imaging of the abdomen and pelvis was performed using the standard protocol following bolus administration of intravenous contrast. CONTRAST:  113mL OMNIPAQUE IOHEXOL 300 MG/ML  SOLN COMPARISON:  08/28/2014 FINDINGS: Lower chest: Normal. Hepatobiliary: Previous cholecystectomy. Liver and biliary tree are within normal. Pancreas: Normal. Spleen: Normal. Adrenals/Urinary Tract: Adrenal glands are normal. Kidneys are normal in size without hydronephrosis or nephrolithiasis. Ureters and bladder are normal. Stomach/Bowel: Postsurgical change over the stomach compatible previous gastric bypass surgery. Surgical suture line over a small bowel loop in the left upper abdomen. There multiple fluid-filled mildly dilated small bowel loops. There is a midline ventral hernia just below the umbilicus containing a loop of small bowel likely site of obstruction. Appendix is normal. Colon is unremarkable. Air and stool present  throughout the colon. Vascular/Lymphatic: Mild calcified plaque over the distal abdominal aorta. No adenopathy. Reproductive: Normal. Other: None. Musculoskeletal: Mild degenerative change of the spine and hips. IMPRESSION: Likely incarcerated hernia with early/partial small bowel obstruction with transition point over a midline ventral hernia just below the umbilicus containing a short segment loop of small bowel. Postsurgical changes as described. Aortic Atherosclerosis (ICD10-I70.0). Electronically Signed   By: Marin Olp M.D.   On: 05/29/2018 21:13    Procedures Dr. Rolm Bookbinder 05/29/18 - exploratory laparotomy, primary repair of incisional hernia   Hospital Course:  57 y/o F with a PMH UH repair and laparoscopic gastric bypass (Dr. Excell Seltzer) in 2015 who presented to [facility] with a cc abd pain, nausea, vomiting. Physical exam revealed an incarcerated incisional hernia with CT Abd as above. Patient was admitted and underwent the above procedure which she tolerated well. Was admitted to the floor. Diet advanced as tolerated. On 05/31/18 the patients vitals were stable, pain controlled on oral meds, mobilizing, tolerating a diet, and medically stable for discharge home. She will follow up with Dr. Donne Hazel as below in 2 weeks and knows to call with questions or concerns. Discharge instructions were discussed with the pt and she voiced understanding - patient advised to take tylenol but continue to avoid ibuprofen given hx of gastric bypass.  Physical Exam: General:  Alert, NAD, pleasant, comfortable Abd:  Soft, appropriately tender around midline incision, staples c/d/i under honeycomb +BS  Allergies as of 05/31/2018      Reactions   Ciprofloxacin    REACTION: Rash      Medication List    TAKE these medications   acetaminophen 500 MG tablet Commonly known as:  TYLENOL Take 500-1,000 mg by mouth every 6 (six) hours as needed for headache.   Calcium Citrate 200 MG Tabs  Take 200 mg by  mouth 3 (three) times daily.   levothyroxine 100 MCG tablet Commonly known as:  SYNTHROID, LEVOTHROID TAKE 1 TABLET BY MOUTH DAILY   multivitamin tablet Take 1 tablet by mouth daily. What changed:  when to take this   oxyCODONE 5 MG immediate release tablet Commonly known as:  Oxy IR/ROXICODONE Take 1-2 tablets (5-10 mg total) by mouth every 4 (four) hours as needed for moderate pain or severe pain.   vitamin B-1 250 MG tablet Take 250 mg by mouth daily.   vitamin B-12 500 MCG tablet Commonly known as:  CYANOCOBALAMIN Take 500 mcg by mouth daily.        Follow-up Information    Rolm Bookbinder, MD. Go on 06/12/2018.   Specialty:  General Surgery Why:  at 11:00 AM for staple removal and post-operative follow up. please call to confirm appointment date/time. Contact information: [address] [place] [address] [phone]           Signed: Obie Dredge, [facility] 05/31/2018, 10:51 AM

## 2018-05-31 NOTE — Progress Notes (Signed)
Pt was provided discharge instructions with mother at bedside.  Pt transported downstairs to son waiting in car.  Hiram Gash RN

## 2018-06-28 ENCOUNTER — Other Ambulatory Visit: Payer: Self-pay

## 2018-06-28 MED ORDER — LEVOTHYROXINE SODIUM 100 MCG PO TABS: 100.0000 ug | ORAL_TABLET | Freq: Every day | ORAL | 3 refills | Status: DC

## 2018-08-01 ENCOUNTER — Encounter: Payer: BLUE CROSS/BLUE SHIELD | Admitting: Family Medicine

## 2018-08-02 ENCOUNTER — Ambulatory Visit: Payer: BLUE CROSS/BLUE SHIELD | Admitting: Family Medicine

## 2018-08-02 VITALS — BP 125/70 | HR 75 | Temp 98.4°F | Wt 184.6 lb

## 2018-08-02 DIAGNOSIS — Z0000 Encounter for general adult medical examination without abnormal findings: Secondary | ICD-10-CM | POA: Diagnosis not present

## 2018-08-02 DIAGNOSIS — Z23 Encounter for immunization: Secondary | ICD-10-CM

## 2018-08-02 DIAGNOSIS — E785 Hyperlipidemia, unspecified: Secondary | ICD-10-CM

## 2018-08-02 DIAGNOSIS — R7303 Prediabetes: Secondary | ICD-10-CM | POA: Diagnosis not present

## 2018-08-02 DIAGNOSIS — E039 Hypothyroidism, unspecified: Secondary | ICD-10-CM | POA: Diagnosis not present

## 2018-08-02 NOTE — Progress Notes (Signed)
pn

## 2018-08-02 NOTE — Patient Instructions (Addendum)
It was great seeing you today! I am glad that things have been going well. Sorry that you had that hernia, but glad that things are healing nicely. We will get some blood work to check your kidney, liver, cholesterol, diabetes, and thyroid level. Follow up as needed, see you back in one year.  We also administered a pneumonia vaccine.

## 2018-08-03 LAB — LIPID PANEL
CHOL/HDL RATIO: 3.2 ratio (ref 0.0–4.4)
Cholesterol, Total: 207 mg/dL — ABNORMAL HIGH (ref 100–199)
HDL: 65 mg/dL (ref >39)
LDL CALC: 120 mg/dL — AB (ref 0–99)
TRIGLYCERIDES: 112 mg/dL (ref 0–149)
VLDL CHOLESTEROL CAL: 22 mg/dL (ref 5–40)

## 2018-08-03 LAB — COMPREHENSIVE METABOLIC PANEL
ALT: 19 IU/L (ref 0–32)
AST: 20 IU/L (ref 0–40)
Albumin/Globulin Ratio: 1.6 (ref 1.2–2.2)
Albumin: 4.2 g/dL (ref 3.5–5.5)
Alkaline Phosphatase: 99 IU/L (ref 39–117)
BUN/Creatinine Ratio: 17 (ref 9–23)
BUN: 18 mg/dL (ref 6–24)
Bilirubin Total: 0.7 mg/dL (ref 0.0–1.2)
CO2: 23 mmol/L (ref 20–29)
Calcium: 9.6 mg/dL (ref 8.7–10.2)
Chloride: 101 mmol/L (ref 96–106)
Creatinine, Ser: 1.04 mg/dL — ABNORMAL HIGH (ref 0.57–1.00)
GFR calc Af Amer: 70 mL/min/{1.73_m2} (ref >59)
GFR, EST NON AFRICAN AMERICAN: 61 mL/min/{1.73_m2} (ref >59)
GLOBULIN, TOTAL: 2.6 g/dL (ref 1.5–4.5)
Glucose: 90 mg/dL (ref 65–99)
Potassium: 5 mmol/L (ref 3.5–5.2)
Sodium: 141 mmol/L (ref 134–144)
Total Protein: 6.8 g/dL (ref 6.0–8.5)

## 2018-08-03 LAB — TSH: TSH: 9.24 u[IU]/mL — ABNORMAL HIGH (ref 0.450–4.500)

## 2018-08-03 LAB — HEMOGLOBIN A1C
Est. average glucose Bld gHb Est-mCnc: 117 mg/dL
Hgb A1c MFr Bld: 5.7 % — ABNORMAL HIGH (ref 4.8–5.6)

## 2018-08-05 ENCOUNTER — Encounter: Payer: Self-pay | Admitting: Family Medicine

## 2018-08-05 NOTE — Assessment & Plan Note (Addendum)
A1c 5.7 from 5.6.  Has been stable so will check in 6 months or 1 year.

## 2018-08-05 NOTE — Progress Notes (Signed)
   HPI 57 year old Caucasian female who presents for annual wellness physical.  Of note patient was recently admitted to hospital back in October for an incarcerated ventral hernia.  Her scar is healed nicely and she has already had follow-up with surgery.  Patient overdue for foot exam, urine microalbumin, operative exam.  Last TSH 1 year ago 0.18.  Last A1c 5.61-year ago.  CC: Annual wellness  ROS:   Review of Systems See HPI for ROS.   CC, SH/smoking status, and VS noted  Objective: BP 125/70 (BP Location: Right Arm, Patient Position: Sitting, Cuff Size: Large)   Pulse 75   Temp 98.4 F (36.9 C) (Oral)   Wt 184 lb 9.6 oz (83.7 kg)   LMP 03/08/2015   SpO2 99%   BMI 34.88 kg/m  Gen: Very pleasant 57 year old Caucasian female resting comfortably in bed. HEENT: NCAT, EOMI, PERRLA. CV: Regular rate and rhythm, no M/R/G. Resp: CTAB, no wheezes, non-labored Abd: Soft, nontender, nondistended.  Well-healed scar ranging from 2 cm proximal to umbilicus down to pelvis. Neuro: Alert and oriented, Speech clear, No gross deficits   Assessment and plan:  Hypothyroidism TSH greater than 9.  Will increase Synthroid to 125 mcg.  Prediabetes A1c 5.7 from 5.6.  Has been stable so will check in 6 months or 1 year.  Hyperlipidemia Elevated cholesterol and LDL.  Can likely manage with diet and exercise. Consider starting atorvastatin 10 mg patient discussion with patient.  Annual physical exam Exam performed.  Drew lipid panel, TSH, CMP.  Still needs urine microalbumin and optho exam.   Orders Placed This Encounter  Procedures  . Pneumococcal polysaccharide vaccine 23-valent greater than or equal to 2yo subcutaneous/IM  . TSH  . Comprehensive metabolic panel    Order Specific Question:   Has the patient fasted?    Answer:   No  . Lipid panel    Order Specific Question:   Has the patient fasted?    Answer:   No  . Hemoglobin A1c    No orders of the defined types were placed in  this encounter.    Guadalupe Dawn MD PGY-2 Family Medicine Resident  08/05/2018 11:25 PM

## 2018-08-05 NOTE — Assessment & Plan Note (Addendum)
Exam performed.  Drew lipid panel, TSH, CMP.  Still needs urine microalbumin and optho exam.

## 2018-08-05 NOTE — Assessment & Plan Note (Signed)
TSH greater than 9.  Will increase Synthroid to 125 mcg.

## 2018-08-05 NOTE — Assessment & Plan Note (Addendum)
Elevated cholesterol and LDL.  Can likely manage with diet and exercise. Consider starting atorvastatin 10 mg patient discussion with patient.

## 2018-08-09 ENCOUNTER — Telehealth: Payer: Self-pay

## 2018-08-09 NOTE — Telephone Encounter (Signed)
Patient calling for lab results from 08/02/18.  Call back is [phone]  Danley Danker, RN [facility])

## 2018-08-12 NOTE — Telephone Encounter (Signed)
Patient has called a second time.  Call back is [phone].  Danley Danker, RN [facility] RN)

## 2018-08-13 NOTE — Telephone Encounter (Signed)
Tried to call and LM.  LDL and TSH are mildly elevated.  I believe that she likely needs an increase in her levothyroxine dose.  Will try calling again later.

## 2018-08-13 NOTE — Telephone Encounter (Signed)
Pt returning Dr. Mike Craze phone call.

## 2018-08-14 MED ORDER — LEVOTHYROXINE SODIUM 112 MCG PO TABS: 112.0000 ug | ORAL_TABLET | Freq: Every day | ORAL | 3 refills | Status: DC

## 2018-08-14 NOTE — Telephone Encounter (Signed)
Fairly lengthy discussion: Apparently she was a diabetic at one point and took metformin and was on a statin.  Had weight loss surg and is not now on either.  Last A1C was 5.7  Cholesterol elevation:  No prior cardiovascular events.  Despite hx of DM, I still consider primary prevention and would not start statin.  Elevated TSH.  Currently on levothyroxine 100 mcg.  Will increase to 112 mcg.  Asked to fU in 3 months for repeat TSH.

## 2018-10-09 ENCOUNTER — Ambulatory Visit: Payer: BLUE CROSS/BLUE SHIELD | Admitting: Family Medicine

## 2019-07-24 ENCOUNTER — Other Ambulatory Visit: Payer: Self-pay | Admitting: Family Medicine

## 2019-07-24 NOTE — Telephone Encounter (Signed)
LM for patient to call back.  She needs an annual physical.  Gail Cruz,CMA

## 2019-07-24 NOTE — Telephone Encounter (Signed)
Will provide refill for patient's synthroid. Please call patient to schedule for follow up labs (TSH).
